# Patient Record
Sex: Female | Born: 1935 | Race: White | Hispanic: No | Smoking: Never smoker
Health system: Southern US, Community
[De-identification: ages and names within clinical notes are randomized; demographics above are authoritative.]

## PROBLEM LIST (undated history)

## (undated) DIAGNOSIS — J449 Chronic obstructive pulmonary disease, unspecified: Secondary | ICD-10-CM

## (undated) DIAGNOSIS — F0390 Unspecified dementia without behavioral disturbance: Secondary | ICD-10-CM

## (undated) DIAGNOSIS — K219 Gastro-esophageal reflux disease without esophagitis: Secondary | ICD-10-CM

## (undated) DIAGNOSIS — F419 Anxiety disorder, unspecified: Secondary | ICD-10-CM

## (undated) DIAGNOSIS — I10 Essential (primary) hypertension: Secondary | ICD-10-CM

## (undated) DIAGNOSIS — F0280 Dementia in other diseases classified elsewhere without behavioral disturbance: Secondary | ICD-10-CM

## (undated) DIAGNOSIS — G309 Alzheimer's disease, unspecified: Secondary | ICD-10-CM

## (undated) DIAGNOSIS — N289 Disorder of kidney and ureter, unspecified: Secondary | ICD-10-CM

## (undated) HISTORY — PX: JOINT REPLACEMENT: SHX530

---

## 1999-10-25 ENCOUNTER — Encounter: Payer: Self-pay | Admitting: Emergency Medicine

## 1999-10-25 ENCOUNTER — Emergency Department (HOSPITAL_COMMUNITY): Admission: EM | Admit: 1999-10-25 | Discharge: 1999-10-25 | Payer: Self-pay | Admitting: Emergency Medicine

## 2000-02-20 ENCOUNTER — Encounter: Payer: Self-pay | Admitting: Emergency Medicine

## 2000-02-20 ENCOUNTER — Emergency Department (HOSPITAL_COMMUNITY): Admission: EM | Admit: 2000-02-20 | Discharge: 2000-02-20 | Payer: Self-pay | Admitting: Emergency Medicine

## 2006-11-25 ENCOUNTER — Encounter: Admission: RE | Admit: 2006-11-25 | Discharge: 2006-11-25 | Payer: Self-pay | Admitting: Family Medicine

## 2006-12-15 ENCOUNTER — Encounter: Admission: RE | Admit: 2006-12-15 | Discharge: 2006-12-15 | Payer: Self-pay | Admitting: Family Medicine

## 2007-02-16 ENCOUNTER — Other Ambulatory Visit: Admission: RE | Admit: 2007-02-16 | Discharge: 2007-02-16 | Payer: Self-pay | Admitting: Obstetrics and Gynecology

## 2008-07-19 ENCOUNTER — Inpatient Hospital Stay (HOSPITAL_COMMUNITY): Admission: EM | Admit: 2008-07-19 | Discharge: 2008-07-27 | Payer: Self-pay | Admitting: Emergency Medicine

## 2008-07-22 ENCOUNTER — Ambulatory Visit: Payer: Self-pay | Admitting: Physical Medicine & Rehabilitation

## 2008-07-23 ENCOUNTER — Encounter (INDEPENDENT_AMBULATORY_CARE_PROVIDER_SITE_OTHER): Payer: Self-pay | Admitting: Internal Medicine

## 2008-08-12 ENCOUNTER — Ambulatory Visit (HOSPITAL_COMMUNITY): Admission: RE | Admit: 2008-08-12 | Discharge: 2008-08-12 | Payer: Self-pay | Admitting: Internal Medicine

## 2008-09-24 ENCOUNTER — Emergency Department (HOSPITAL_COMMUNITY): Admission: EM | Admit: 2008-09-24 | Discharge: 2008-09-24 | Payer: Self-pay | Admitting: Emergency Medicine

## 2009-01-02 ENCOUNTER — Inpatient Hospital Stay (HOSPITAL_COMMUNITY): Admission: EM | Admit: 2009-01-02 | Discharge: 2009-01-07 | Payer: Self-pay | Admitting: Emergency Medicine

## 2009-06-01 ENCOUNTER — Inpatient Hospital Stay (HOSPITAL_COMMUNITY): Admission: EM | Admit: 2009-06-01 | Discharge: 2009-06-03 | Payer: Self-pay | Admitting: Emergency Medicine

## 2009-06-02 ENCOUNTER — Ambulatory Visit: Payer: Self-pay | Admitting: Surgery

## 2009-06-02 ENCOUNTER — Encounter (INDEPENDENT_AMBULATORY_CARE_PROVIDER_SITE_OTHER): Payer: Self-pay | Admitting: Internal Medicine

## 2010-06-10 ENCOUNTER — Encounter: Payer: Self-pay | Admitting: Obstetrics and Gynecology

## 2010-08-05 LAB — DIFFERENTIAL
Basophils Absolute: 0 10*3/uL (ref 0.0–0.1)
Eosinophils Absolute: 0.1 10*3/uL (ref 0.0–0.7)
Eosinophils Absolute: 0.1 10*3/uL (ref 0.0–0.7)
Eosinophils Relative: 2 % (ref 0–5)
Eosinophils Relative: 2 % (ref 0–5)
Lymphocytes Relative: 18 % (ref 12–46)
Lymphocytes Relative: 19 % (ref 12–46)
Lymphs Abs: 1.1 10*3/uL (ref 0.7–4.0)
Monocytes Absolute: 0.5 10*3/uL (ref 0.1–1.0)
Monocytes Absolute: 0.5 10*3/uL (ref 0.1–1.0)
Monocytes Relative: 8 % (ref 3–12)
Neutro Abs: 4.3 10*3/uL (ref 1.7–7.7)

## 2010-08-05 LAB — CBC
HCT: 30.4 % — ABNORMAL LOW (ref 36.0–46.0)
MCHC: 34.4 g/dL (ref 30.0–36.0)
MCHC: 34.9 g/dL (ref 30.0–36.0)
MCV: 93.2 fL (ref 78.0–100.0)
Platelets: 145 10*3/uL — ABNORMAL LOW (ref 150–400)
Platelets: 167 10*3/uL (ref 150–400)
RBC: 3.39 MIL/uL — ABNORMAL LOW (ref 3.87–5.11)
RBC: 3.61 MIL/uL — ABNORMAL LOW (ref 3.87–5.11)
WBC: 4 10*3/uL (ref 4.0–10.5)
WBC: 5.7 10*3/uL (ref 4.0–10.5)

## 2010-08-05 LAB — BASIC METABOLIC PANEL
BUN: 18 mg/dL (ref 6–23)
Calcium: 8.4 mg/dL (ref 8.4–10.5)
Calcium: 8.7 mg/dL (ref 8.4–10.5)
Chloride: 111 mEq/L (ref 96–112)
Creatinine, Ser: 0.94 mg/dL (ref 0.4–1.2)
GFR calc Af Amer: >60 mL/min (ref >60)
GFR calc non Af Amer: 56 mL/min — ABNORMAL LOW (ref >60)
GFR calc non Af Amer: 59 mL/min — ABNORMAL LOW (ref >60)
Potassium: 4.4 mEq/L (ref 3.5–5.1)

## 2010-08-05 LAB — COMPREHENSIVE METABOLIC PANEL
ALT: 14 U/L (ref 0–35)
AST: 24 U/L (ref 0–37)
Albumin: 3.3 g/dL — ABNORMAL LOW (ref 3.5–5.2)
CO2: 27 mEq/L (ref 19–32)
Calcium: 8.2 mg/dL — ABNORMAL LOW (ref 8.4–10.5)
Chloride: 108 mEq/L (ref 96–112)
GFR calc Af Amer: >60 mL/min (ref >60)
GFR calc non Af Amer: 57 mL/min — ABNORMAL LOW (ref >60)
Sodium: 139 mEq/L (ref 135–145)

## 2010-08-05 LAB — CARDIAC PANEL(CRET KIN+CKTOT+MB+TROPI)
CK, MB: 8.5 ng/mL (ref 0.3–4.0)
CK, MB: 9 ng/mL (ref 0.3–4.0)
Relative Index: 3.7 — ABNORMAL HIGH (ref 0.0–2.5)
Total CK: 208 U/L — ABNORMAL HIGH (ref 7–177)
Total CK: 224 U/L — ABNORMAL HIGH (ref 7–177)
Total CK: 231 U/L — ABNORMAL HIGH (ref 7–177)
Troponin I: 0.02 ng/mL (ref 0.00–0.06)
Troponin I: 0.03 ng/mL (ref 0.00–0.06)
Troponin I: 0.05 ng/mL (ref 0.00–0.06)

## 2010-08-05 LAB — TSH: TSH: 2.405 u[IU]/mL (ref 0.350–4.500)

## 2010-08-05 LAB — URINALYSIS, ROUTINE W REFLEX MICROSCOPIC
Bilirubin Urine: NEGATIVE
Nitrite: NEGATIVE
Protein, ur: NEGATIVE mg/dL
Specific Gravity, Urine: 1.007 (ref 1.005–1.030)
Urobilinogen, UA: 0.2 mg/dL (ref 0.0–1.0)
pH: 7 (ref 5.0–8.0)

## 2010-08-05 LAB — URINE CULTURE

## 2010-08-05 LAB — T3, FREE: T3, Free: 2.3 pg/mL (ref 2.3–4.2)

## 2010-08-05 LAB — URINE MICROSCOPIC-ADD ON

## 2010-08-05 LAB — POCT CARDIAC MARKERS: Myoglobin, poc: 264 ng/mL (ref 12–200)

## 2010-08-23 ENCOUNTER — Emergency Department (HOSPITAL_COMMUNITY): Payer: Medicare Other

## 2010-08-23 ENCOUNTER — Emergency Department (HOSPITAL_COMMUNITY)
Admission: EM | Admit: 2010-08-23 | Discharge: 2010-08-23 | Disposition: A | Discharge status: Home / Self Care | Payer: Medicare Other | Attending: Emergency Medicine | Admitting: Emergency Medicine

## 2010-08-23 DIAGNOSIS — Z96649 Presence of unspecified artificial hip joint: Secondary | ICD-10-CM | POA: Insufficient documentation

## 2010-08-23 DIAGNOSIS — W010XXA Fall on same level from slipping, tripping and stumbling without subsequent striking against object, initial encounter: Secondary | ICD-10-CM | POA: Insufficient documentation

## 2010-08-23 DIAGNOSIS — F0390 Unspecified dementia without behavioral disturbance: Secondary | ICD-10-CM | POA: Insufficient documentation

## 2010-08-23 DIAGNOSIS — IMO0002 Reserved for concepts with insufficient information to code with codable children: Secondary | ICD-10-CM | POA: Insufficient documentation

## 2010-08-23 DIAGNOSIS — M542 Cervicalgia: Secondary | ICD-10-CM | POA: Insufficient documentation

## 2010-08-23 DIAGNOSIS — S0100XA Unspecified open wound of scalp, initial encounter: Secondary | ICD-10-CM | POA: Insufficient documentation

## 2010-08-23 DIAGNOSIS — I10 Essential (primary) hypertension: Secondary | ICD-10-CM | POA: Insufficient documentation

## 2010-08-23 DIAGNOSIS — Y92009 Unspecified place in unspecified non-institutional (private) residence as the place of occurrence of the external cause: Secondary | ICD-10-CM | POA: Insufficient documentation

## 2010-08-25 LAB — COMPREHENSIVE METABOLIC PANEL
AST: 22 U/L (ref 0–37)
BUN: 24 mg/dL — ABNORMAL HIGH (ref 6–23)
CO2: 27 mEq/L (ref 19–32)
Calcium: 8.7 mg/dL (ref 8.4–10.5)
Chloride: 111 mEq/L (ref 96–112)
Creatinine, Ser: 0.97 mg/dL (ref 0.4–1.2)
GFR calc Af Amer: >60 mL/min (ref >60)
GFR calc non Af Amer: 54 mL/min — ABNORMAL LOW (ref >60)
Glucose, Bld: 135 mg/dL — ABNORMAL HIGH (ref 70–99)
Total Bilirubin: 0.5 mg/dL (ref 0.3–1.2)

## 2010-08-25 LAB — URINE CULTURE

## 2010-08-25 LAB — URINALYSIS, ROUTINE W REFLEX MICROSCOPIC
Ketones, ur: NEGATIVE mg/dL
Nitrite: NEGATIVE
Protein, ur: NEGATIVE mg/dL
pH: 5.5 (ref 5.0–8.0)

## 2010-08-25 LAB — LIPID PANEL
HDL: 69 mg/dL (ref >39)
Total CHOL/HDL Ratio: 2.1 RATIO
Triglycerides: 22 mg/dL (ref <150)

## 2010-08-25 LAB — GLUCOSE, CAPILLARY
Glucose-Capillary: 105 mg/dL — ABNORMAL HIGH (ref 70–99)
Glucose-Capillary: 115 mg/dL — ABNORMAL HIGH (ref 70–99)
Glucose-Capillary: 118 mg/dL — ABNORMAL HIGH (ref 70–99)
Glucose-Capillary: 120 mg/dL — ABNORMAL HIGH (ref 70–99)
Glucose-Capillary: 121 mg/dL — ABNORMAL HIGH (ref 70–99)
Glucose-Capillary: 128 mg/dL — ABNORMAL HIGH (ref 70–99)
Glucose-Capillary: 140 mg/dL — ABNORMAL HIGH (ref 70–99)
Glucose-Capillary: 162 mg/dL — ABNORMAL HIGH (ref 70–99)
Glucose-Capillary: 70 mg/dL (ref 70–99)
Glucose-Capillary: 83 mg/dL (ref 70–99)
Glucose-Capillary: 85 mg/dL (ref 70–99)
Glucose-Capillary: 89 mg/dL (ref 70–99)
Glucose-Capillary: 89 mg/dL (ref 70–99)

## 2010-08-25 LAB — BASIC METABOLIC PANEL
CO2: 28 mEq/L (ref 19–32)
GFR calc non Af Amer: 46 mL/min — ABNORMAL LOW (ref >60)
GFR calc non Af Amer: 53 mL/min — ABNORMAL LOW (ref >60)
Glucose, Bld: 96 mg/dL (ref 70–99)
Potassium: 3.7 mEq/L (ref 3.5–5.1)
Potassium: 3.7 mEq/L (ref 3.5–5.1)
Sodium: 137 mEq/L (ref 135–145)
Sodium: 144 mEq/L (ref 135–145)

## 2010-08-25 LAB — CBC
HCT: 32.9 % — ABNORMAL LOW (ref 36.0–46.0)
HCT: 33 % — ABNORMAL LOW (ref 36.0–46.0)
Hemoglobin: 11.1 g/dL — ABNORMAL LOW (ref 12.0–15.0)
MCHC: 33.8 g/dL (ref 30.0–36.0)
MCV: 94.8 fL (ref 78.0–100.0)
Platelets: 179 10*3/uL (ref 150–400)
RDW: 14.5 % (ref 11.5–15.5)
WBC: 7 10*3/uL (ref 4.0–10.5)

## 2010-08-25 LAB — POCT I-STAT, CHEM 8
Chloride: 111 mEq/L (ref 96–112)
Creatinine, Ser: 1 mg/dL (ref 0.4–1.2)
Glucose, Bld: 90 mg/dL (ref 70–99)
HCT: 32 % — ABNORMAL LOW (ref 36.0–46.0)
Hemoglobin: 10.9 g/dL — ABNORMAL LOW (ref 12.0–15.0)
Potassium: 4.3 mEq/L (ref 3.5–5.1)
Sodium: 144 mEq/L (ref 135–145)

## 2010-08-25 LAB — PROTIME-INR
Prothrombin Time: 14.3 seconds (ref 11.6–15.2)
Prothrombin Time: 14.4 seconds (ref 11.6–15.2)

## 2010-08-25 LAB — BRAIN NATRIURETIC PEPTIDE: Pro B Natriuretic peptide (BNP): 270 pg/mL — ABNORMAL HIGH (ref 0.0–100.0)

## 2010-08-25 LAB — CK TOTAL AND CKMB (NOT AT ARMC)
CK, MB: 2.7 ng/mL (ref 0.3–4.0)
Relative Index: INVALID (ref 0.0–2.5)
Relative Index: INVALID (ref 0.0–2.5)
Total CK: 82 U/L (ref 7–177)

## 2010-08-25 LAB — HEMOGLOBIN A1C: Hgb A1c MFr Bld: 5.5 % (ref 4.6–6.1)

## 2010-08-25 LAB — TSH: TSH: 2.112 u[IU]/mL (ref 0.350–4.500)

## 2010-08-28 LAB — URINALYSIS, ROUTINE W REFLEX MICROSCOPIC
Ketones, ur: NEGATIVE mg/dL
Nitrite: NEGATIVE
Protein, ur: NEGATIVE mg/dL
pH: 7 (ref 5.0–8.0)

## 2010-08-30 LAB — CROSSMATCH

## 2010-08-30 LAB — HEPATIC FUNCTION PANEL
ALT: 14 U/L (ref 0–35)
AST: 22 U/L (ref 0–37)
Albumin: 3.2 g/dL — ABNORMAL LOW (ref 3.5–5.2)
Alkaline Phosphatase: 49 U/L (ref 39–117)
Bilirubin, Direct: 0.2 mg/dL (ref 0.0–0.3)
Indirect Bilirubin: 0.8 mg/dL (ref 0.3–0.9)
Total Bilirubin: 1 mg/dL (ref 0.3–1.2)
Total Protein: 5.6 g/dL — ABNORMAL LOW (ref 6.0–8.3)

## 2010-08-30 LAB — BASIC METABOLIC PANEL
BUN: 11 mg/dL (ref 6–23)
BUN: 12 mg/dL (ref 6–23)
BUN: 14 mg/dL (ref 6–23)
BUN: 14 mg/dL (ref 6–23)
BUN: 14 mg/dL (ref 6–23)
CO2: 24 mEq/L (ref 19–32)
CO2: 26 mEq/L (ref 19–32)
Calcium: 8.3 mg/dL — ABNORMAL LOW (ref 8.4–10.5)
Calcium: 8.4 mg/dL (ref 8.4–10.5)
Chloride: 104 mEq/L (ref 96–112)
Chloride: 107 mEq/L (ref 96–112)
Chloride: 108 mEq/L (ref 96–112)
Chloride: 108 mEq/L (ref 96–112)
Creatinine, Ser: 0.9 mg/dL (ref 0.4–1.2)
Creatinine, Ser: 0.92 mg/dL (ref 0.4–1.2)
Creatinine, Ser: 0.99 mg/dL (ref 0.4–1.2)
Creatinine, Ser: 1 mg/dL (ref 0.4–1.2)
Creatinine, Ser: 1.09 mg/dL (ref 0.4–1.2)
GFR calc Af Amer: 57 mL/min — ABNORMAL LOW (ref >60)
GFR calc Af Amer: >60 mL/min (ref >60)
GFR calc non Af Amer: 47 mL/min — ABNORMAL LOW (ref >60)
GFR calc non Af Amer: 53 mL/min — ABNORMAL LOW (ref >60)
GFR calc non Af Amer: 57 mL/min — ABNORMAL LOW (ref >60)
Glucose, Bld: 101 mg/dL — ABNORMAL HIGH (ref 70–99)
Glucose, Bld: 80 mg/dL (ref 70–99)
Glucose, Bld: 99 mg/dL (ref 70–99)
Potassium: 3.4 mEq/L — ABNORMAL LOW (ref 3.5–5.1)
Potassium: 3.5 mEq/L (ref 3.5–5.1)
Potassium: 3.8 mEq/L (ref 3.5–5.1)

## 2010-08-30 LAB — URINE MICROSCOPIC-ADD ON

## 2010-08-30 LAB — CBC
HCT: 26.3 % — ABNORMAL LOW (ref 36.0–46.0)
HCT: 26.8 % — ABNORMAL LOW (ref 36.0–46.0)
HCT: 28.9 % — ABNORMAL LOW (ref 36.0–46.0)
HCT: 33.9 % — ABNORMAL LOW (ref 36.0–46.0)
Hemoglobin: 11.7 g/dL — ABNORMAL LOW (ref 12.0–15.0)
Hemoglobin: 9.3 g/dL — ABNORMAL LOW (ref 12.0–15.0)
MCHC: 34 g/dL (ref 30.0–36.0)
MCHC: 34.5 g/dL (ref 30.0–36.0)
MCHC: 35 g/dL (ref 30.0–36.0)
MCV: 90.7 fL (ref 78.0–100.0)
MCV: 91.1 fL (ref 78.0–100.0)
MCV: 91.7 fL (ref 78.0–100.0)
MCV: 92 fL (ref 78.0–100.0)
MCV: 92.4 fL (ref 78.0–100.0)
Platelets: 135 10*3/uL — ABNORMAL LOW (ref 150–400)
Platelets: 138 10*3/uL — ABNORMAL LOW (ref 150–400)
Platelets: 140 10*3/uL — ABNORMAL LOW (ref 150–400)
Platelets: 140 10*3/uL — ABNORMAL LOW (ref 150–400)
RBC: 2.58 MIL/uL — ABNORMAL LOW (ref 3.87–5.11)
RBC: 3.67 MIL/uL — ABNORMAL LOW (ref 3.87–5.11)
RBC: 3.68 MIL/uL — ABNORMAL LOW (ref 3.87–5.11)
RBC: 4.1 MIL/uL (ref 3.87–5.11)
RDW: 13.2 % (ref 11.5–15.5)
RDW: 13.3 % (ref 11.5–15.5)
RDW: 13.6 % (ref 11.5–15.5)
RDW: 13.7 % (ref 11.5–15.5)
WBC: 6.8 10*3/uL (ref 4.0–10.5)
WBC: 6.9 10*3/uL (ref 4.0–10.5)
WBC: 7.4 10*3/uL (ref 4.0–10.5)
WBC: 8 10*3/uL (ref 4.0–10.5)
WBC: 9.1 10*3/uL (ref 4.0–10.5)

## 2010-08-30 LAB — BASIC METABOLIC PANEL WITH GFR
CO2: 24 meq/L (ref 19–32)
Chloride: 105 meq/L (ref 96–112)
GFR calc Af Amer: >60 mL/min (ref >60)
Glucose, Bld: 114 mg/dL — ABNORMAL HIGH (ref 70–99)
Sodium: 136 meq/L (ref 135–145)

## 2010-08-30 LAB — DIFFERENTIAL
Basophils Absolute: 0 K/uL (ref 0.0–0.1)
Basophils Relative: 1 % (ref 0–1)
Eosinophils Absolute: 0 10*3/uL (ref 0.0–0.7)
Eosinophils Absolute: 0 K/uL (ref 0.0–0.7)
Eosinophils Relative: 0 % (ref 0–5)
Lymphocytes Relative: 5 % — ABNORMAL LOW (ref 12–46)
Lymphs Abs: 0.3 10*3/uL — ABNORMAL LOW (ref 0.7–4.0)
Lymphs Abs: 0.9 10*3/uL (ref 0.7–4.0)
Monocytes Absolute: 0.5 K/uL (ref 0.1–1.0)
Monocytes Relative: 7 % (ref 3–12)
Monocytes Relative: 8 % (ref 3–12)
Neutro Abs: 5.9 10*3/uL (ref 1.7–7.7)
Neutro Abs: 7.4 10*3/uL (ref 1.7–7.7)
Neutrophils Relative %: 81 % — ABNORMAL HIGH (ref 43–77)
Neutrophils Relative %: 87 % — ABNORMAL HIGH (ref 43–77)

## 2010-08-30 LAB — PROTIME-INR
INR: 1.2 (ref 0.00–1.49)
INR: 1.4 (ref 0.00–1.49)
INR: 1.4 (ref 0.00–1.49)
Prothrombin Time: 15.8 seconds — ABNORMAL HIGH (ref 11.6–15.2)
Prothrombin Time: 15.8 seconds — ABNORMAL HIGH (ref 11.6–15.2)
Prothrombin Time: 17.1 seconds — ABNORMAL HIGH (ref 11.6–15.2)
Prothrombin Time: 17.8 seconds — ABNORMAL HIGH (ref 11.6–15.2)
Prothrombin Time: 18.2 seconds — ABNORMAL HIGH (ref 11.6–15.2)
Prothrombin Time: 19.2 seconds — ABNORMAL HIGH (ref 11.6–15.2)

## 2010-08-30 LAB — URINALYSIS, ROUTINE W REFLEX MICROSCOPIC
Bilirubin Urine: NEGATIVE
Glucose, UA: NEGATIVE mg/dL
Ketones, ur: 15 mg/dL — AB
Leukocytes, UA: NEGATIVE
Nitrite: NEGATIVE
Protein, ur: 30 mg/dL — AB
Specific Gravity, Urine: 1.017 (ref 1.005–1.030)
Urobilinogen, UA: 1 mg/dL (ref 0.0–1.0)
pH: 7.5 (ref 5.0–8.0)

## 2010-08-30 LAB — VITAMIN B12: Vitamin B-12: 291 pg/mL (ref 211–911)

## 2010-08-30 LAB — FOLATE: Folate: >20 ng/mL

## 2010-08-30 LAB — TROPONIN I: Troponin I: 0.04 ng/mL (ref 0.00–0.06)

## 2010-08-30 LAB — CARDIAC PANEL(CRET KIN+CKTOT+MB+TROPI)
Relative Index: 2.6 — ABNORMAL HIGH (ref 0.0–2.5)
Total CK: 385 U/L — ABNORMAL HIGH (ref 7–177)
Total CK: 394 U/L — ABNORMAL HIGH (ref 7–177)
Troponin I: 0.04 ng/mL (ref 0.00–0.06)

## 2010-08-30 LAB — RETICULOCYTES
RBC.: 3.59 MIL/uL — ABNORMAL LOW (ref 3.87–5.11)
Retic Count, Absolute: 53.9 10*3/uL (ref 19.0–186.0)
Retic Ct Pct: 1.5 % (ref 0.4–3.1)

## 2010-08-30 LAB — IRON AND TIBC
Iron: <10 ug/dL — ABNORMAL LOW (ref 42–135)
UIBC: 225 ug/dL

## 2010-08-30 LAB — CULTURE, BLOOD (ROUTINE X 2)
Culture: NO GROWTH
Culture: NO GROWTH

## 2010-08-30 LAB — FERRITIN: Ferritin: 130 ng/mL (ref 10–291)

## 2010-08-30 LAB — URINE CULTURE
Colony Count: NO GROWTH
Culture: NO GROWTH

## 2010-08-30 LAB — CK TOTAL AND CKMB (NOT AT ARMC)
CK, MB: 3.1 ng/mL (ref 0.3–4.0)
Relative Index: 2 (ref 0.0–2.5)
Total CK: 152 U/L (ref 7–177)

## 2010-08-30 LAB — ABO/RH: ABO/RH(D): A NEG

## 2010-08-30 LAB — GLUCOSE, CAPILLARY: Glucose-Capillary: 97 mg/dL (ref 70–99)

## 2010-08-30 LAB — HEMOCCULT GUIAC POC 1CARD (OFFICE): Fecal Occult Bld: POSITIVE

## 2010-08-30 LAB — TECHNOLOGIST SMEAR REVIEW

## 2010-08-30 LAB — TSH: TSH: 2.562 u[IU]/mL (ref 0.350–4.500)

## 2010-08-30 LAB — APTT: aPTT: 31 seconds (ref 24–37)

## 2010-08-30 LAB — LACTATE DEHYDROGENASE: LDH: 168 U/L (ref 94–250)

## 2010-08-30 LAB — MAGNESIUM: Magnesium: 2.2 mg/dL (ref 1.5–2.5)

## 2010-08-30 LAB — HEMOGLOBIN AND HEMATOCRIT, BLOOD: Hemoglobin: 13.6 g/dL (ref 12.0–15.0)

## 2010-10-02 NOTE — Consult Note (Signed)
[name], [name] NO.:  [id number]   MEDICAL RECORD NO.:  [id number]          PATIENT TYPE:  INP   LOCATION:  2601                         FACILITY:  [facility]   PHYSICIAN:  Lyn Records, M.D.   DATE OF BIRTH:  [birth year]   DATE OF CONSULTATION:  01/03/2009  DATE OF DISCHARGE:                                 CONSULTATION   CONCLUSIONS:  1. Sinus node dysfunction.      a.     Paroxysmal atrial fibrillation during March 2010 admission.      b.     Sinus bradycardia and junctional rhythm on beta-blocker       therapy, January 02, 2009.  2. Chronic obstructive pulmonary disease.  3. Dementia.  4. Hypertension.  5. Lethargy and altered mental status.   RECOMMENDATIONS:  1. Resume beta-blocker therapy and add metoprolol 12.5 mg b.i.d.  2. No specific cardiac workup is needed.  3. Call if recurrent atrial fibrillation.  4. Not a good Coumadin candidate.  5. Okay to use amlodipine (it will not slow heart rate).  It could be      the cause of the patient's peripheral edema, however.  6. Call if we can be of further assistance.   COMMENTS:  Ms. Piggott is 64.  She has dementia.  She lives alone.  She  was admitted with lethargy and altered mental status.  It was suspected  that she got too much Benadryl and possibly other medications.  When she  was admitted to the hospital, she had bradycardia with junctional escape  rhythm of 42 beats per minute.  EKG revealed an ectopic atrial  bradycardia at 44 beats per minute.  No acute ST-T wave changes were  noted.  The patient's blood pressure was normal.  It was not clear if  she was having any symptoms related to the bradycardia.  Certainly with  normal blood pressure on admission, it is unlikely that the bradycardia  was the cause of her altered mental status.  Nevertheless, the beta-  blocker therapy was held.  The patient's heart rate is in the 70s this  morning.  Her family is with her.  They state that her level of  consciousness is much improved.   MEDICATIONS:  Her medical regimen on admission to the hospital included:  1. Alprazolam 1 mg twice a day.  2. Avapro as needed.  3. Benadryl 25 mg every 6 hours.  4. Metoprolol 50 mg daily.  5. Namenda twice a day.  6. Amlodipine 2.5 mg daily.   OBJECTIVE:  VITAL SIGNS:  Blood pressure is 143/94 and heart rate is 78.  HEENT:  Mouth revealed sinus rhythm.  NECK:  No JVD is noted.  LUNGS:  Clear.  CARDIAC:  No gallop, murmur, or click.  ABDOMEN:  Soft.  EXTREMITIES:  Scaling erythema in the lower extremities bilaterally.  NEUROLOGIC:  Reveals a patient who is confused but in no acute distress.   IMAGING:  EKG done yesterday reveals a junctional escape rhythm at 44  beats per minute.   LABORATORY DATA:  A white blood  cell count of 5300, hemoglobin of 11.2,  and platelet count of 179,000.  Admitting glucose was 62, creatinine  1.0, and BUN 30.  Cardiac markers were unremarkable.  BNP was 270.   Vital signs had demonstrated adequate blood pressures since admission.   DISCUSSION:  The patient is doing well at this time.  I still believe  that a little beta-blocker will be helpful.  We will resume metoprolol  at 12.5 mg p.o. b.i.d.      Lyn Records, M.D.  Electronically Signed     HWS/MEDQ  D:  01/03/2009  T:  01/03/2009  Job:  [id number]   cc:   Donia Guiles, M.D.

## 2010-10-02 NOTE — Consult Note (Signed)
NAMEGRACI, HULCE NO.:  [id number]   MEDICAL RECORD NO.:  [id number]          PATIENT TYPE:  INP   LOCATION:  4708                         FACILITY:  [facility]   PHYSICIAN:  Lyn Records, M.D.   DATE OF BIRTH:  Feb 12, 1917   DATE OF CONSULTATION:  07/23/2008  DATE OF DISCHARGE:                                 CONSULTATION   REASON FOR CONSULTATION:  Paroxysmal atrial fibrillation.   CONCLUSIONS:  1. Paroxysmal atrial fibrillation, stress-induced.  She is currently      in normal sinus rhythm.  The stress factors include pain, beta-      blocker withdrawal, respiratory infection.  2. Left hip fracture suffered while hospitalized.  3. COPD, now compensated.  4. Anemia.  5. Hypertension.  6. Dementia.   RECOMMENDATIONS:  1. Resume/continue beta-blocker therapy and for the time being      increase the dose of metoprolol to 75 mg per day.  2. Check BNP.  3. Echocardiogram to evaluate LV function.  4. No further or specific cardiac evaluation.  5. Not a reasonable long-term Coumadin candidate.   COMMENTS:  The patient is 73 and has a history of dementia.  She was  admitted to the hospital with shortness of breath, decompensated.  She  has not had history of cardiac problems.  She is demented and lives in a  nursing facility.  The medications on admission to the hospital were  metoprolol and alprazolam.  While in the hospital, she became confused  and fell, causing a hip fracture.  Since that time including pinning of  her hip, she has had paroxysmal atrial fibrillation.   ALLERGIES:  None known.   SOCIAL HISTORY:  Lives in assisted living.  Denies tobacco.  Denies  ethanol.   FAMILY HISTORY:  Noncontributory.   OBJECTIVE:  VITAL SIGNS:  On exam, the blood pressure is 130/70, the  heart rate is 98 sinus rhythm with PACs.  Neck veins are clear 1/6  systolic murmurs heard.  LUNGS:  Clear.  EXTREMITIES:  No edema.    Laboratory data revealed no gross  abnormalities.  Troponins have been  negative.  TSH is unremarkable.  EKG reveals no acute ST-T wave change.   DISCUSSION:  The patient is having paroxysmal atrial fibrillation for  multiple reasons including her age and stress of this hospitalization.  We will simply increase her current beta-blocker dose and hope that this  will help calm things down.  Will follow with you.      Lyn Records, M.D.  Electronically Signed     HWS/MEDQ  D:  07/23/2008  T:  07/24/2008  Job:  [id number]   cc:   Donia Guiles, M.D.

## 2010-10-02 NOTE — Discharge Summary (Signed)
NAMEBROCHA, Sherri NO.:  [id number]   MEDICAL RECORD NO.:  [id number]          PATIENT TYPE:  INP   LOCATION:  6727                         FACILITY:  [facility]   PHYSICIAN:  Kela Millin, M.D.DATE OF BIRTH:  1917/01/27   DATE OF ADMISSION:  01/02/2009  DATE OF DISCHARGE:  01/07/2009                               DISCHARGE SUMMARY   ADMITTING PHYSICIAN:  Michiel Cowboy, MD   DISCHARGING PHYSICIAN:  Kela Millin, MD   CONSULTANTSGeorga Hacking, MD, with Cardiology.   PRIMARY CARE PHYSICIAN:  Donia Guiles, MD   CHIEF COMPLAINT/REASON FOR ADMISSION:  Ms. [name] is a 77 year old  female patient with history of dementia, paroxysmal atrial fibrillation,  and COPD who for 2 weeks has been having increasing lower extremity  edema and redness and itching bilaterally.  The family has been using  Benadryl cream as well as hydrocortisone cream without improvement in  symptoms.  Prior to admission, the patient lived alone in an elder care  apartment complex on the ninth floor and despite her dementia apparently  was capable of taking care for herself and self-administer medications.  At the time the patient was admitted, she was admitted because of  sleepiness and lethargy and it was uncertain if the patient had  accidentally overmedicated herself on the Benadryl.  She was very  lethargic upon presentation and ED physician noted that she had  bradycardia down into the 40s, uncertain what her initial glucose was by  glucometer.  Apparently, she was she was started on fluids with dextrose  in the event hypoglycemia was contributing to her altered mental status.  Family was present during the patient's admission and family thinks that  the patient has been adequately and accurately taking her medications,  but they could not be sure because they did not have an accurate pill  count on the medications prior to beginning administering the over-the-  counter medications and therefore could not be again sure of the actual  dosage the patient had taken.  Again, on exam, the patient was afebrile,  vital signs were stable except for the bradycardia, heart rate in the  40s, occasionally up into the 50s.  She was in no acute distress  otherwise other than the lethargy.  Lungs were clear.  The pulse itself  was regular.  She had 2+ lower extremity edema without erythema, some  superficial skin discoloration.  Hemoglobin was 10.9.  Sodium 144,  potassium 4.3, and creatinine 1.0.  Initial cardiac isoenzymes were  negative.  Initial chest x-ray showed COPD and EKG showed junctional  escape rhythm with a rate of 40.  The patient was admitted with the  following diagnoses:  1. Lethargy, multifactorial, secondary to possible accidental over-      ingestion of Benadryl as well as due to the bradycardia.  2. Chronic lower extremity edema.  3. History of right-sided heart failure based on echo.  4. Hypertension, currently moderately controlled.  5. History of paroxysmal atrial fibrillation, apparently not a      Coumadin candidate.  6. History of chronic  obstructive pulmonary disease, currently      compensated.  7. History of dysphasia with history of aspiration pneumonia.  8. History of dementia.  9. Deconditioning.  10.Chronic anemia.   HOSPITAL COURSE:  The patient was admitted to the telemetry floor.  Because of bradycardia, her beta-blockers were placed on hold.  Also,  her Norvasc was placed on hold, especially with the lower extremity  edema.  Orthostatic vital signs were checked and BNPs were routinely  checked.  CT of the head was also checked which revealed basically  chronic atrophy as well as chronic ischemic white matter disease.  Urinalysis and culture proved to be negative for any infectious etiology  of the patient's altered mental status.  As noted, a cardiology consult  was obtained and recommendations were to restart  beta-blocker at very  low dose 25 mg a day once the patient return to sinus brady sinus  rhythm.  This did occur on January 03, 2009.  No additional cardiac  workup was indicated and it was reported that she was not a good  Coumadin candidate.  Dr. Katrinka Blazing actually evaluated the patient and he  reported that it was okay for her to use amlodipine because there is no  chronotropic affect with that medication.  The patient did tolerate a  low-dose beta-blocker without any worsening of bradycardia and the  orthostasis, therefore no indications for pacemaker and no additional  cardiac workup and Cardiology signed off.   For the next several days, the patient's mental status was the main  issue and there was some concern as to whether she could return back to  independent living.  Clinical Social Work was consulted for placement.  They met with the patient's son, [name].  He stated the patient had been  in [place] in the past because of her fractured hip, but was very  unhappy not being in her own home and really wanted to try to make more  attempt to be able to return home.  Therefore, he is requested that she  be able to go home with home health.  Family will stay with her 24 hours  for the next 2 weeks.   On the day before discharge, I had a long discussion with the patient's  son regarding expectations at discharge given the findings in the CT  scan and known dementia and concerns that with an acute break in mental  status that she may not regain to baseline.  He told me that on January 05, 2009, they had actually been to visit a neurologist.  I guess he is  managing her Namenda for her dementia and reported at that time she had  no significant issues and was deemed appropriate for independent living  at that time.  Throughout the conversation, it was emphasized that the  patient again may not improve and she would require 24-hour care.  Son  wishes to attempt to take the patient home  back to the independent  living apartment complex, spend at least 2 weeks with her 24 hours  supervision with the family and in any event she does not return to  baseline, and he and Dr. Arvilla Market felt the patient inappropriate for  independent living.  They will look into assisted living and/or skilled  nursing placement.  24 hours prior to discharge, the patient was sleepy,  but awake, smiled appropriately.  Exam was unremarkable.  She did have a  Foley catheter in place and I evaluated the patient  close to 3 o'clock  in the afternoon and felt it was more appropriate to discontinue the  Foley today and wait until the morning to discharge the patient home  given the fact she needs to be observed to make sure she has no voiding  difficulties and I do not feel comfortable discharging an elderly person  home late in the evening.  Family agrees.  I did review the patient's  medication list with the son prior to discharge.  He is aware of the new  dosages on metoprolol and I discussed with him it is better to obtain a  new prescription for metoprolol, then to try to break the current 50 mg  tablets in quarters to ensure dosage accuracy.  I also discussed with  him that since her blood pressure is controlled, we can currently hold  the Norvasc.  This may have been contributing to her lower extremity  edema and probable stasis dermatitis which has resolved since being in  the hospital.   FINAL DISCHARGE DIAGNOSES:  1. Bradycardia secondary to beta-blocker usage.  2. Lower extremity edema, now resolved, possibly related to recent      decreased cardiac output due to bradycardia as well as use of      Norvasc.  3. Hypertension, currently controlled on low-dose beta-blockers.  4. Altered mental status and known dementia prior to admission.  5. Chronic anemia.  6. History of paroxysmal atrial fibrillation, not a Coumadin      candidate.   DISCHARGE MEDICATIONS:  1. Alprazolam 1 mg tablets to  take one-half to one-quarter tablet      every 12 hours as needed for anxiety.  2. Over-the-counter ibuprofen as needed for discomfort.  3. Over-the-counter Tylenol as needed for discomfort.  Benadryl,      please discontinued.  4. Metoprolol 25 mg tablets 1/2 tablet twice daily.  Please stop the      50-mg dosage tablets.  5. Namenda at previous dose twice daily.  6. Amlodipine 2.5 mg daily.  Please stop this medication.   DIET:  Low sodium, heart healthy, if needed booster Ensure supplements 3  times a day.   ACTIVITY:  Continue with current rolling walker, shower chair, and  toilet.  Increase activity slowly.  May walk up steps.  May walk with  assistance.   FOLLOWUP:  The patient needs to follow up with Dr. Arvilla Market.  She is to  call to be seen in 1-2 weeks.  Please note that [facility] will  continue with the patient after discharge.      Allison L. Rennis Harding, N.P.      Kela Millin, M.D.  Electronically Signed    ALE/MEDQ  D:  01/06/2009  T:  01/07/2009  Job:  [id number]   cc:   Donia Guiles, M.D.

## 2010-10-02 NOTE — Op Note (Signed)
NAMEKYANI, SIMKIN NO.:  [id number]   MEDICAL RECORD NO.:  [id number]          PATIENT TYPE:  INP   LOCATION:  6733                         FACILITY:  [facility]   PHYSICIAN:  Lubertha Basque. Dalldorf, M.D.DATE OF BIRTH:  [birth year]   DATE OF PROCEDURE:  07/21/2008  DATE OF DISCHARGE:                               OPERATIVE REPORT   PREOPERATIVE DIAGNOSIS:  Left femoral neck fracture.   POSTOPERATIVE DIAGNOSIS:  Left femoral neck fracture.   PROCEDURE:  Left hip hemiarthroplasty.   ANESTHESIA:  General.   ATTENDING SURGEON:  Lubertha Basque. Jerl Santos, MD   ASSISTANT:  Lindwood Qua, PA   INDICATIONS FOR PROCEDURE:  The patient is an 77 year old woman who  unfortunately suffered a fall in the hospital in the last 24 hours.  X-  rays have shown a displaced femoral neck fracture.  She is offered  hemiarthroplasty in hopes of allowing her to sit and potentially stand  once again.  Informed operative consent was obtained from her son after  discussion of possible complications including reaction to anesthesia,  infection, DVT, PE, and death.   SUMMARY, FINDINGS, AND PROCEDURE:  Under general anesthesia through a  small posterior approach, a left hip hemiarthroplasty was performed.  She had fair bone quality.  We addressed her problem with an uncemented  DePuy unipolar hemiarthroplasty using a size 3 basic Summit stem with a  -3 size 42 hip ball.  Lindwood Qua assisted throughout and was  invaluable to the completion of the case in that he helped position and  retract while I performed the procedure.  He also closed simultaneously  to help minimize OR time.   DESCRIPTION OF PROCEDURE:  The patient was taken to the operating suite  where general anesthetic was applied without difficulty.  She was  positioned in the lateral decubitus position with left hip up.  Hip  positioners were utilized, bony prominences were padded, and an axillary  roll was placed.  A posterior  approach was taken to the left hip.  After  the skin incision, the dissection was carried through a paucity of  adipose tissue to the IT band and gluteus maximus fascia.  IT band was  incised longitudinally to expose the short external rotators of the hip  which were tagged and reflected.  A posterior capsulectomy was  performed.  A femoral neck cut was made below the side of the fracture  with a saw.  We then removed the femoral head and sized to a 42.  This  was trialed and felt to be adequate.  We then reamed and broached up to  a size 3 at which point a trial reduction was done with a -3 neck  length.  This seemed to give [place] good leg length and good stability and  extension with external rotation and flexion with internal rotation.  The trial component was removed followed by placement of the size 3  basic Summit femoral stem in appropriate anteversion.  We then capped  this with a -3 size 42 hip ball.  She was reduced and again was stable  in the aforementioned positions.  The leg lengths were felt to be  roughly equal.  The wound was irrigated followed by reapproximation of  the short external rotators to the greater trochanteric region with  nonabsorbable suture.  IT band was reapproximated with #1 Vicryl in the  interrupted fashion as was the gluteus maximus fascia.  Subcutaneous  tissues were reapproximated with Vicryl followed by skin closure with  staples.  Mepilex dressing was applied.  Estimated blood loss was 50 mL  and intraoperative could be obtained from anesthesia records.   DISPOSITION:  The patient was extubated in the operating room and taken  to the recovery room in stable addition.  She was to be admitted back to  the Medicine Teaching Service for appropriate postop care.  We will  treat her with Lovenox for DVT prophylaxis along with immediate  mobilization.      Lubertha Basque Jerl Santos, M.D.  Electronically Signed     PGD/MEDQ  D:  07/21/2008  T:  07/22/2008   Job:  [id number]

## 2010-10-02 NOTE — Discharge Summary (Signed)
[name], [name]            ACCOUNT NO.:  [id number]   MEDICAL RECORD NO.:  [id number]          PATIENT TYPE:  INP   LOCATION:  4708                         FACILITY:  [facility]   PHYSICIAN:  Hollice Espy, M.D.DATE OF BIRTH:  12-26-1916   DATE OF ADMISSION:  07/18/2008  DATE OF DISCHARGE:  07/27/2008                               DISCHARGE SUMMARY   PCP:  Dr. Donia Guiles.   CONSULTANTS ON THIS CASE:  1. Dr. Verdis Prime, cardiology.  2. Dr. Marcene Corning, orthopedic surgery.   DISCHARGE DIAGNOSES:  1. Left femoral neck fracture status post left hip hemiarthroplasty.  2. Atrial fibrillation, paroxysmal, now normal sinus rhythm, rate      controlled.  3. Chronic obstructive pulmonary disease exacerbation.  4. Aspiration pneumonia.  5. Dysphagia.  6. Delirium felt to be multifactorial secondary to hypoxia from      pneumonia, chronic anxiolytic medication use, and possible      underlying dementia, as well as hospital psychosis.  7. History of hypertension.  8. Deconditioning.  9. Anemia secondary to postop status post 2 units packed red blood      cell transfusion.   HOSPITAL COURSE:  Patient is a 77 year old white female who has got a  history of hypertension who presented to the emergency room with  shortness of breath, cough, initially some substernal chest pain and she  was also found to have a temp as high of 102.4 and wheezing.  By x-ray,  it looked like she had some signs of pneumonia and COPD.  She was  started on IV antibiotics, neb treatments, and it was found later in a  discussion with her family the patient had, while not a smoker, had been  exposed to lots of secondhand smoke through her husband and was felt to  have some underlying COPD.  In regards to the COPD and pneumonia, she  actually did very well with antibiotics and neb treatments and during  her hospitalization completed the full 7 days of antibiotics.  It was  noted later on that she was  having some episodes of choking and so she  was evaluated by speech therapy.  She was found to have signs of  dysphagia.  A modified barium done July 26, 2008, noted moderate to  severe pharyngeal dysphagia secondary to generalized weakness coupled  with an anterior curvature over C-spine from 3 to 5 into the pharyngeal  space and pending a full epiglottic deflection.  They noted silent  aspiration of thin liquids.  Diet of dysphagia II nectar-thick liquids,  crushed meds, full supervision at meals, and swallowing 1 to 2 extra  times for swallow was recommended.  The plan will be to continue this  type of diet at her rehab facility.  In regard to her hip fracture,  during patient's hospitalization on the afternoon of July 20, 2008,  patient had tried to get out of bed and in her confused state she had  fallen.  Nurse tended to the patient and the patient reported that she  was not sure what had happened but she had fallen.  Her laceration  was  noted at her IV site.  Patient reported initially hitting her head but  she had no loss of consciousness.  [facility] were contacted and  upon injury report and with no change in her mentation patient was given  medication initially for pain starting off with Tylenol with plans to  increase pain medication as needed and then after a call I was planning  on returning to her bedside for a formal musculoskeletal exam.  Patient  was complaining of some pain in her right hip which she said improved  after receiving some Tylenol.  In the interim, patient's mentation  remained stable after her fall.  Approximately 2 hours later, report  came from nursing that the family was quite upset and wanted x-rays done  right away.  This was passed on to me and I ordered a skeletal survey  specifically right hip films.  Then, following this, I spoke with the  patient's son for about 45 minutes discussing his mother's case.  We  covered the events and the care  and while the patient's son was upset  with the situation he appeared to be pleased with my plans for treatment  and following and we then spoke of the patient's confusion.  We both  agreed that this was likely in most part of the patient being out of her  home environment and the patient's son wanted to after his mother's  breathing issues were stable, return her to her home environment where  she would have less confusion.  However, on the following day on July 21, 2008, the patient's final report showed hip films returned back  showing a femoral neck fracture of the left hip.  This was then  discussed immediately with orthopedic surgery.  Dr. Jerl Santos came and  saw the patient and was able to operate her on that same day.  She  previously had no heart issues other than hypertension and her breathing  issues were stable with normal vital signs with her saturating at 98% on  2 L.  The patient was cleared and after discussion with the son he gave  consent.  The patient was taken to the OR on July 21, 2008.  Patient  tolerated the surgery with no initial complications.  Postop, she did  well.  She looked to be somewhat somnolent even after anesthesia.  There  initially was a question of some increased confusion.  A CT scan of the  head was done which was found to be completely unremarkable.  Patient  remained somewhat somnolent over the next day.  Her anxiety medication,  Xanax, was tapered back.  Her hemoglobin started to trend down and by  July 24, 2008, it was down to 8.2.  She received 2 units packed red  blood cells and by the following day her hemoglobin was up to 13.6.  She  still remained somewhat weak.  She was evaluated by inpatient rehab but  because of her advanced age and the ability to participate aggressively  in rehab recommended more outpatient skilled nursing.  Family requested  [facility] versus [facility] for skilled nursing.  Bed offers were put out  and tentatively at  this time it looks like she has a possible bed  acceptance a [facility].  At that time, speech therapy  were able to note the aspiration issues and she completed the modified  barium swallow.  Speech pathology is recommending a followup modified  barium swallow in 5  to 7 days.  Patient's overall disposition from  initial presentation is improved in terms of her breathing issues, as  well as her now discovery of aspiration.  Speech therapy's feeling is  likely that she has been aspirating for some time and with hopefully  these restrictions her breathing will much improve.   DISCHARGE DIET:  Will be a caffeine-free, low-sodium, dysphagia II diet,  chopped meats, nectar-thick liquids, crushing meds, full supervision at  meals, swallowing 1 to 2 extra times per swallow.   Please note that post procedure in regard to her heart patient had an  episode briefly of paroxysmal A-Fib that was self-controlled.  The first  event occurred on July 20, 2008.  Initially, it was self-contained,  however, a second event occurred.  She had no episodes of severe  hypotension and was otherwise stable but because of requiring Cardizem  drip she was transferred down to the step-down unit on the early morning  hours of July 22, 2008.  After receiving 1 dose of Cardizem, she self-  converted back in normal sinus rhythm.  Dr. Verdis Prime from cardiology  was consulted who evaluated the patient.  A 2D echo was done.  The  results of this were noting a preserved ejection fraction of 70%,  moderate mitral annular calcification, some mild dilatation of the right  ventricle, and the right atrium being dilated but otherwise relatively  stable.  Dr. Katrinka Blazing adjusted the patient's metoprolol which previously  had been at home at 25 b.i.d.  He tapered this up to 50 b.i.d. which the  patient has been able to tolerate.  She has since then had no other  issues and Dr. Katrinka Blazing signed off.   ACTIVITY:  Will be as  per PT/OT of skilled nursing facility.   DISCHARGE MEDICATIONS:  Will be as follows:  1. Xanax 0.25 p.o. b.i.d., dose or frequency to be increased pending      agitation.  2. Albuterol and Atrovent inhalers 4 times a day p.r.n. for shortness      of breath.  3. Metoprolol 50 mg p.o. b.i.d.  4. Thick-It Liquid Thickener for all liquids.  5. Vicodin 5/500 one to 2 tabs p.o. every 4 to 6 hours p.r.n.  6. Zofran 4 mg every 6 hours p.r.n. for nausea.   PLAN:  Will be for followup.  Patient will follow up with Dr. Arvilla Market 1  week after discharge from skilled nursing facility.  She will have a  repeat modified barium swallow done in about 7 days from discharge from  [facility] while she is at rehab.      Hollice Espy, M.D.  Electronically Signed     SKK/MEDQ  D:  07/26/2008  T:  07/26/2008  Job:  [id number]   cc:   Donia Guiles, M.D.  Lyn Records, M.D.  Lubertha Basque Jerl Santos, M.D.  [phone]

## 2010-10-02 NOTE — H&P (Signed)
[name], [name]   MEDICAL RECORD NO.:  [id number]          PATIENT TYPE:  EMS   LOCATION:  MAJO                         FACILITY:  [facility]   PHYSICIAN:  Ramiro Harvest, MD    DATE OF BIRTH:  09/05/1916   DATE OF ADMISSION:  07/18/2008  DATE OF DISCHARGE:                              HISTORY & PHYSICAL   PRIMARY CARE PHYSICIAN:  Dr. Arvilla Market of [facility] physicians.   HISTORY OF PRESENT ILLNESS:  [name] is an 77 year old white  female, resident of assisted living facility with a history of  hypertension, vertigo and diverticulosis, who presents to the ED with a  2-day history of productive cough of greenish sputum, shortness of  breath, some chills, nausea and some substernal chest pain.  The patient  is unable to provide an adequate history and as such, most of the  history was obtained from the ED records.  Per ED records, patient with  a 2-day history of the above symptoms with also some nasal congestion  and generalized weakness.  The patient also endorses some constipation.  The patient denies any fevers.  No nausea, no vomiting, no diarrhea, no  dysuria, no focal neurological symptoms.  The patient unable to further  describe her chest pain.  The patient was seen in the ED and noted to  have a temperature as high as 102.4, some expiratory wheezes.  CBC which  was obtained had a white count of 6.8, hemoglobin of 11.7, hematocrit of  33.9, platelet count of 140 and an absolute neutrophil count of 5.9.  Basic metabolic profile was essentially unremarkable.  Urinalysis was  unremarkable.  Chest x-ray did show emphysematous changes without any  focal disease.  The patient was given some neb treatments, ibuprofen and  some IV fluids in the ED with improvement in her symptoms, as well as  some azithromycin per ED doctor.  We were called to admit the patient  for further evaluation and management.  The patient denied any sick   contacts.   ALLERGIES:  NO KNOWN DRUG ALLERGIES.   PAST MEDICAL HISTORY:  Per ED chart; hypertension, vertigo, questionable  history of a heart murmur and diverticulosis per CT of November 25, 2006.  We will need to verify the patient's medical history per PCP, also per  family at there is no family nearby.   MEDICATIONS:  The patient is unable to name.  We will need again verify  these per family or per PCP.   SOCIAL HISTORY:  The patient lives alone in an assisted living facility.  No tobacco history; however, has a history of secondhand smoke.  No  alcohol use.  No IV drug use.   FAMILY HISTORY:  Noncontributory.   REVIEW OF SYSTEMS:  As per HPI, otherwise negative.   PHYSICAL EXAMINATION:  VITAL SIGNS:  T-max 102.4 down to 98.3, blood  pressure 213/78 down to 163/71, pulse of 92, respiratory rate 20.  Saturating 99% on room air.  GENERAL:  The patient is sleeping in no apparent distress.  HEENT:  Normocephalic, atraumatic.  Pupils equal, round and reactive to  light and accommodation.  Extraocular movements intact.  Oropharynx is  clear.  No lesions, no exudates.  NECK:  Supple.  No lymphadenopathy.  RESPIRATORY:  Bibasilar coarse breath sounds, left greater than right.  CARDIOVASCULAR:  Regular rate and rhythm.  No murmurs, rubs or gallops.  ABDOMEN:  Soft, nontender, nondistended.  Positive bowel sounds.  EXTREMITIES:  No clubbing, cyanosis or edema.  NEUROLOGICAL:  The patient is alert and oriented x3.  Cranial nerves II-  XII are grossly intact.  No focal deficits.   ADMISSION LABORATORY DATA:  Chest x-ray shows emphysematous changes  without focal disease.  CBC with a white count of 6.8, hemoglobin 11.7,  hematocrit 33.9, platelet count of 140, ANC of 5.9.  Basic metabolic  profile; sodium 136, potassium 3.8, chloride 105, bicarb 24, glucose  114, BUN 14, creatinine 0.92 and a calcium of 8.4.  Urinalysis was  yellow clear, specific gravity 1.017, pH of 7.5, glucose  negative,  bilirubin negative, ketones 15, blood small, protein 30, urobilinogen  1.0, nitrite negative, leukocytes negative.  Microscopy; WBC 0-2, RBCs 0-  2.  Blood cultures are pending.   ASSESSMENT AND PLAN:  [name] is an 77 year old female with no  significant past medical history except for hypertension and vertigo who  presented to the emergency department with a 2-day history of a  productive cough of greenish sputum, some shortness of breath, some  chills, as well as some expiratory wheezing per emergency department  doctor's examination.   1. Cough/shortness of breath/chest pain.  Differential includes      pneumonia versus bronchitis versus a chronic obstructive pulmonary      disease exacerbation.  Will check a sputum gram-stain and culture.      Check a magnesium level.  Check an EKG.  Check a hepatic panel.      Cycle cardiac enzymes q.12 h., x2.  Check a repeat chest x-ray.  We      will place on empiric antibiotics of Avelox, nebulizer treatments,      Mucinex, oxygen and IV fluids and will follow and treat      symptomatically.  2. Weakness, likely secondary to problem #1.  Cycle cardiac enzymes      q.12 h., x2.  Check an EKG.  Check orthostatics.  Check a magnesium      level.  Hydrate with IV fluids, physical therapy/occupational      therapy and follow.  3. Hypertension.  Unable to verify the patient's medications.  We will      hold blood pressure medications for now.  If needed, Norvasc may be      started.  We will monitor for .  4. Diverticulosis.  5. Vertigo.  6. Anemia.  We will check an anemia panel, guaiac stools and follow      hemoglobin and hematocrit.  7. Thrombocytopenia.  Check a peripheral smear, check an anemia panel,      check LDH, check a haptoglobin, check coags.  We will follow.  8. Prophylaxis.  Pepcid for gastrointestinal prophylaxis.  Sequential      compression devices for deep venous thrombosis prophylaxis.   It has been a  pleasure taking care of [name].      Ramiro Harvest, MD  Electronically Signed     DT/MEDQ  D:  07/19/2008  T:  07/19/2008  Job:  [id number]   cc:   [name], M.D.

## 2010-10-02 NOTE — H&P (Signed)
NAME:  [name], BOWERY NO.:  [id number]   MEDICAL RECORD NO.:  [id number]          PATIENT TYPE:  INP   LOCATION:  1827                         FACILITY:  [facility]   PHYSICIAN:  Michiel Cowboy, MDDATE OF BIRTH:  Feb 19, 1917   DATE OF ADMISSION:  01/02/2009  DATE OF DISCHARGE:                              HISTORY & PHYSICAL   PRIMARY CARE PHYSICIAN:  Dr. Arvilla Market.   CHIEF COMPLAINTS:  Sleepiness and lethargy.   The patient is a 77 year old female with past medical history  significant for dementia, paroxysmal atrial fibrillation as well as  COPD.  The patient for the past 2 or more weeks has been having lower  extremity edema and recently started to develop more of a redness  bilaterally as well as itching.  Her legs were itching.  Reportedly she  had been scratching her legs as well as her upper body.  Her daughter-in-  law became concerned and brought her some Benadryl as well as  hydrocortisone cream to apply to the legs.  Despite moderate dementia,  the patient does live by herself and self-administers her medications.  She was told to take Benadryl by her daughter-in-law no more than every  6 hours.  It is not quite sure how much she actually has taken, but when  today her daughter went to check on her, she was asleep and although  arousable, drifted back to sleep, at which point she was brought into  the emergency department.  In ED, she was noted to be bradycardic down  to 40s or so and was started on glucagon drip.  The patient herself also  takes her own medications, including metoprolol.  Her family thinks that  she has been taking it accurately but cannot be quite sure.   The patient is pleasantly confused, lethargic but arouses to voice.   REVIEW OF SYSTEMS:  Unable to obtain.   PAST MEDICAL HISTORY:  1. Significant for left femoral neck fracture.  2. Atrial fibrillation.  3. Chronic obstructive pulmonary disease.  4. Aspiration pneumonia.  5.  Dysphagia.  6. Delirium.  7. Hypertension.  8. Decondition.  9. Anemia.   SOCIAL HISTORY:  The patient lives alone at home.  She does not want to  live with her family, very independent, according to her son.  Does not  smoke or drink, does not abuse drugs.  Widow of 30 years.   FAMILY HISTORY:  Noncontributory.   ALLERGIES:  NO KNOWN DRUG ALLERGIES.   MEDICATIONS:  1. According to her family, she takes alprazolam usually 0.25 to 0.5      mg twice a day or so, but per family, she has been taking it      randomly, and they cannot quite know how often she actually has      been taking it.  2. Ibuprofen once or twice a week as needed for pain.  3. Benadryl, recently started, 25 mg every 6 hours.  4. Metoprolol 50 mg in the morning.  5. Namenda 5 mg twice daily.  6. Amlodipine 12.5 mg once a day.  PHYSICAL EXAMINATION:  VITALS:  Temperature 97.3, blood pressure 149/56,  pulse initially 43, now up to the mid 50s, respirations 18, satting 100%  on room air.  The patient appears to be in no acute distress, pleasant.  Head nontraumatic, slightly diminished skin turgor and slightly dry  mucous membranes.  LUNGS:  Clear to auscultation bilaterally.  HEART: Regular rate and rhythm.  No murmurs appreciated but somewhat  slow.  ABDOMEN:  Soft, nontender, nondistended.  LOWER EXTREMITIES:  With 2+ edema bilaterally.  No erythema noted.  Some  superficial skin discoloration noted.   LABS:  Hemoglobin 10.9, sodium 144, potassium 4.3, creatinine 1.  Cardiac enzymes negative.   Chest x-ray showing COPD.   EKG showing heart rate in 40s and junction escape rhythm.   INR 1.1.   ASSESSMENT/PLAN:  This is a 77 year old female somewhat lethargic, most  likely secondary to taking too much Benadryl with slow heart rates.  This also could be either attributed to over-sedation versus taking a  higher amount of metoprolol than she should be.  Secondary to her  dementia, it is hard to say if  she is actually taking her present  medications as prescribed.  1. Bradycardia:  Her case was discussed with Dr. Donnie Aho, who felt that      she would probably be best suited in medicine service.  Wanted to      go ahead and hold her beta blocker and continued to monitor her.      Given that her heart rate is still fairly slow, will admit to step-      down.  For now will continue to do Glucagon drips since it has been      started and have possibly a question of beneficial effects and also      since I cannot completely rule out beta blocker overdose at this      point.  Will cycle cardiac markers.  2. Lower extremity edema.  She has history of right-sided heart      failure per echogram.  At this point, her fluid status is      debatable.  Will check albumin level and hold off on IV fluids for      now.  Will check orthostatics once able to stand up, check BNP      level.  3. Hypertension.  Will hold metoprolol and Norvasc.  4. Prophylaxis:  Protonix and Lovenox.  5. Altered mental status, most likely secondary to Benadryl but will      obtain CT scan of the head and check UA for urine infection and      monitor.  6. Code status:  The patient does not wish to be resuscitated or      intubated or given medications while her heart rate is okay.  Did      not address particularly defibrillation.  If becomes an issue, will      need to be addressed with family.      Michiel Cowboy, MD  Electronically Signed     AVD/MEDQ  D:  01/02/2009  T:  01/02/2009  Job:  [id number]   cc:   Donia Guiles, M.D.

## 2012-03-21 ENCOUNTER — Emergency Department (HOSPITAL_COMMUNITY)
Admission: EM | Admit: 2012-03-21 | Discharge: 2012-03-21 | Disposition: A | Discharge status: Home / Self Care | Payer: Medicare Other | Attending: Emergency Medicine | Admitting: Emergency Medicine

## 2012-03-21 ENCOUNTER — Encounter (HOSPITAL_COMMUNITY): Payer: Self-pay | Admitting: Emergency Medicine

## 2012-03-21 DIAGNOSIS — F0280 Dementia in other diseases classified elsewhere without behavioral disturbance: Secondary | ICD-10-CM | POA: Insufficient documentation

## 2012-03-21 DIAGNOSIS — Z8659 Personal history of other mental and behavioral disorders: Secondary | ICD-10-CM | POA: Insufficient documentation

## 2012-03-21 DIAGNOSIS — G309 Alzheimer's disease, unspecified: Secondary | ICD-10-CM | POA: Insufficient documentation

## 2012-03-21 DIAGNOSIS — I10 Essential (primary) hypertension: Secondary | ICD-10-CM | POA: Insufficient documentation

## 2012-03-21 DIAGNOSIS — J4489 Other specified chronic obstructive pulmonary disease: Secondary | ICD-10-CM | POA: Insufficient documentation

## 2012-03-21 DIAGNOSIS — K219 Gastro-esophageal reflux disease without esophagitis: Secondary | ICD-10-CM | POA: Insufficient documentation

## 2012-03-21 DIAGNOSIS — J449 Chronic obstructive pulmonary disease, unspecified: Secondary | ICD-10-CM | POA: Insufficient documentation

## 2012-03-21 DIAGNOSIS — L253 Unspecified contact dermatitis due to other chemical products: Secondary | ICD-10-CM | POA: Insufficient documentation

## 2012-03-21 DIAGNOSIS — L232 Allergic contact dermatitis due to cosmetics: Secondary | ICD-10-CM

## 2012-03-21 HISTORY — DX: Unspecified dementia, unspecified severity, without behavioral disturbance, psychotic disturbance, mood disturbance, and anxiety: F03.90

## 2012-03-21 HISTORY — DX: Alzheimer's disease, unspecified: G30.9

## 2012-03-21 HISTORY — DX: Dementia in other diseases classified elsewhere, unspecified severity, without behavioral disturbance, psychotic disturbance, mood disturbance, and anxiety: F02.80

## 2012-03-21 HISTORY — DX: Anxiety disorder, unspecified: F41.9

## 2012-03-21 HISTORY — DX: Chronic obstructive pulmonary disease, unspecified: J44.9

## 2012-03-21 HISTORY — DX: Essential (primary) hypertension: I10

## 2012-03-21 HISTORY — DX: Gastro-esophageal reflux disease without esophagitis: K21.9

## 2012-03-21 MED ORDER — LISINOPRIL 10 MG PO TABS
10.0000 mg | ORAL_TABLET | Freq: Once | ORAL | Status: AC
  Administered 2012-03-21: 10 mg via ORAL
  Filled 2012-03-21: qty 1

## 2012-03-21 MED ORDER — DIPHENHYDRAMINE HCL 25 MG PO CAPS
25.0000 mg | ORAL_CAPSULE | Freq: Once | ORAL | Status: AC
  Administered 2012-03-21: 25 mg via ORAL
  Filled 2012-03-21: qty 1

## 2012-03-21 MED ORDER — PREDNISONE 20 MG PO TABS
60.0000 mg | ORAL_TABLET | Freq: Once | ORAL | Status: AC
  Administered 2012-03-21: 60 mg via ORAL
  Filled 2012-03-21: qty 3

## 2012-03-21 MED ORDER — FAMOTIDINE 20 MG PO TABS
20.0000 mg | ORAL_TABLET | Freq: Once | ORAL | Status: AC
  Administered 2012-03-21: 20 mg via ORAL
  Filled 2012-03-21: qty 1

## 2012-03-21 MED ORDER — DIPHENHYDRAMINE HCL 25 MG PO TABS: 25.0000 mg | ORAL_TABLET | Freq: Four times a day (QID) | ORAL | Status: DC | PRN

## 2012-03-21 NOTE — ED Provider Notes (Signed)
Patient reassessed.  Periorbital redness and swelling have improved.  No respiratory distress.  Patient will be discharged home with family.    Jimmye Norman, NP 03/21/12 [id number]

## 2012-03-21 NOTE — ED Notes (Signed)
Care transferred and report given to Greg, RN. 

## 2012-03-21 NOTE — ED Provider Notes (Signed)
Garnie Derico is a 78 y.o. female in CDU from pod A. Sign out from Dr. Ranae Palms as follows: Patient had a mild allergic reaction that was not treated with epinephrine. She did receive Benadryl, Pepcid, and prednisone. She was observed in the pod for several hours. He would like to more hours of observation before she is discharged. Patient has erythema and swelling around the eyes. He thinks that the allergic reaction is secondary to a lotion she applied to this area.  Upon her arrival to the CDU was noted that her blood pressure was high we gave her her normal dose of lisinopril. Patient seen and examined at the bedside she is demented but verbal nonsensical words. There is periorbital erythema with mild swelling no respiratory distress, lung sounds are clear to auscultation bilaterally, no accessory muscle use or stridor.Marland Kitchen Heart is a regular rate and rhythm with no murmurs rubs or gallops and abdominal exam is benign with no tenderness to palpation.  Pt signed out to NP smith at shift change.    Wynetta Emery, PA-C 03/23/12 [phone]

## 2012-03-21 NOTE — ED Notes (Signed)
Per EMS, patient "got into some ivory body wash this morning and her face / eyes swelled".   Patient is disoriented at baseline.   Patient claims she doesn't remember getting into soap.

## 2012-03-21 NOTE — ED Provider Notes (Signed)
History     CSN: [id number]  Arrival date & time 03/21/12  1034   First MD Initiated Contact with Patient 03/21/12 1038      Chief Complaint  Patient presents with  . Allergic Reaction    (Consider location/radiation/quality/duration/timing/severity/associated sxs/prior treatment) HPI Pt with dementia at baseline lives in [place]. Staff noticed bl eye swelling this morning after pt washed her face. Family states she has used body wash for face wash in the past with similar results. Pt is unable to give details of history due to baseline dementia. Level V Caveat.  Past Medical History  Diagnosis Date  . GERD (gastroesophageal reflux disease)   . Anxiety   . Dementia   . Alzheimer disease   . COPD (chronic obstructive pulmonary disease)   . Hypertension     No past surgical history on file.  No family history on file.  History  Substance Use Topics  . Smoking status: Never Smoker   . Smokeless tobacco: Not on file  . Alcohol Use: No    OB History    Grav Para Term Preterm Abortions TAB SAB Ect Mult Living                  Review of Systems  Unable to perform ROS: Dementia    Allergies  Review of patient's allergies indicates no known allergies.  Home Medications  No current outpatient prescriptions on file.  BP 201/65  Pulse 84  Temp 97.8 F (36.6 C) (Oral)  Resp 16  SpO2 98%  Physical Exam  Nursing note and vitals reviewed. Constitutional: She appears well-developed and well-nourished. No distress.  HENT:  Head: Atraumatic.  Mouth/Throat: Oropharynx is clear and moist.       bl periorbital edema. No lip or tongue swelling  Eyes: EOM are normal. Pupils are equal, round, and reactive to light.  Neck: Normal range of motion. Neck supple.  Cardiovascular: Normal rate and regular rhythm.   Pulmonary/Chest: Effort normal and breath sounds normal. Stridor present. No respiratory distress. She has no wheezes. She has no rales. She exhibits no tenderness.    Abdominal: Soft. Bowel sounds are normal. She exhibits no distension. There is no tenderness. There is no rebound and no guarding.  Musculoskeletal: Normal range of motion. She exhibits no edema and no tenderness.  Neurological: She is alert.       Moves all ext  Skin: Skin is warm and dry. No rash noted. No erythema.  Psychiatric: She has a normal mood and affect. Her behavior is normal.    ED Course  Procedures (including critical care time)  Labs Reviewed - No data to display No results found.   No diagnosis found.    MDM          Loren Racer, MD 03/21/12 [phone]

## 2012-03-22 NOTE — ED Provider Notes (Signed)
Medical screening examination/treatment/procedure(s) were performed by non-physician practitioner and as supervising physician I was immediately available for consultation/collaboration.   Glynn Octave, MD 03/22/12 1146

## 2012-03-24 NOTE — ED Provider Notes (Signed)
Medical screening examination/treatment/procedure(s) were performed by non-physician practitioner and as supervising physician I was immediately available for consultation/collaboration.   Loren Racer, MD 03/24/12 [phone]

## 2012-05-03 ENCOUNTER — Encounter (HOSPITAL_COMMUNITY): Payer: Self-pay | Admitting: Emergency Medicine

## 2012-05-03 ENCOUNTER — Emergency Department (HOSPITAL_COMMUNITY)
Admission: EM | Admit: 2012-05-03 | Discharge: 2012-05-04 | Disposition: A | Discharge status: Home / Self Care | Payer: Medicare Other | Attending: Emergency Medicine | Admitting: Emergency Medicine

## 2012-05-03 ENCOUNTER — Emergency Department (HOSPITAL_COMMUNITY): Payer: Medicare Other

## 2012-05-03 DIAGNOSIS — Y939 Activity, unspecified: Secondary | ICD-10-CM | POA: Insufficient documentation

## 2012-05-03 DIAGNOSIS — J4489 Other specified chronic obstructive pulmonary disease: Secondary | ICD-10-CM | POA: Insufficient documentation

## 2012-05-03 DIAGNOSIS — F0280 Dementia in other diseases classified elsewhere without behavioral disturbance: Secondary | ICD-10-CM | POA: Insufficient documentation

## 2012-05-03 DIAGNOSIS — S0003XA Contusion of scalp, initial encounter: Secondary | ICD-10-CM | POA: Insufficient documentation

## 2012-05-03 DIAGNOSIS — I10 Essential (primary) hypertension: Secondary | ICD-10-CM | POA: Insufficient documentation

## 2012-05-03 DIAGNOSIS — Z8719 Personal history of other diseases of the digestive system: Secondary | ICD-10-CM | POA: Insufficient documentation

## 2012-05-03 DIAGNOSIS — Z79899 Other long term (current) drug therapy: Secondary | ICD-10-CM | POA: Insufficient documentation

## 2012-05-03 DIAGNOSIS — Y929 Unspecified place or not applicable: Secondary | ICD-10-CM | POA: Insufficient documentation

## 2012-05-03 DIAGNOSIS — G309 Alzheimer's disease, unspecified: Secondary | ICD-10-CM | POA: Insufficient documentation

## 2012-05-03 DIAGNOSIS — J449 Chronic obstructive pulmonary disease, unspecified: Secondary | ICD-10-CM | POA: Insufficient documentation

## 2012-05-03 DIAGNOSIS — S1093XA Contusion of unspecified part of neck, initial encounter: Secondary | ICD-10-CM | POA: Insufficient documentation

## 2012-05-03 DIAGNOSIS — W19XXXA Unspecified fall, initial encounter: Secondary | ICD-10-CM | POA: Insufficient documentation

## 2012-05-03 DIAGNOSIS — F411 Generalized anxiety disorder: Secondary | ICD-10-CM | POA: Insufficient documentation

## 2012-05-03 LAB — CBC WITH DIFFERENTIAL/PLATELET
Basophils Relative: 1 % (ref 0–1)
Eosinophils Absolute: 0.3 10*3/uL (ref 0.0–0.7)
MCH: 30.1 pg (ref 26.0–34.0)
MCHC: 32.7 g/dL (ref 30.0–36.0)
Neutrophils Relative %: 49 % (ref 43–77)
Platelets: 185 10*3/uL (ref 150–400)
RDW: 14.1 % (ref 11.5–15.5)

## 2012-05-03 LAB — COMPREHENSIVE METABOLIC PANEL
ALT: 11 U/L (ref 0–35)
Albumin: 3 g/dL — ABNORMAL LOW (ref 3.5–5.2)
Alkaline Phosphatase: 71 U/L (ref 39–117)
BUN: 24 mg/dL — ABNORMAL HIGH (ref 6–23)
Potassium: 3.6 mEq/L (ref 3.5–5.1)
Sodium: 140 mEq/L (ref 135–145)
Total Protein: 5.8 g/dL — ABNORMAL LOW (ref 6.0–8.3)

## 2012-05-03 LAB — URINALYSIS, ROUTINE W REFLEX MICROSCOPIC
Glucose, UA: NEGATIVE mg/dL
Ketones, ur: NEGATIVE mg/dL
Leukocytes, UA: NEGATIVE
pH: 5.5 (ref 5.0–8.0)

## 2012-05-03 MED ORDER — SODIUM CHLORIDE 0.9 % IV SOLN
INTRAVENOUS | Status: DC
  Administered 2012-05-03: 23:00:00 via INTRAVENOUS

## 2012-05-03 NOTE — ED Notes (Signed)
Pt from [facility]. Pt hx alzheimer. Staff notices today that she is less independent which is different than how she has been and also increased weakness. Family visited today at 1400 and saw that she was "trembling" family requested that she take her xanax which she doesn't normally take but is prescribed to her. Staff gave her the xanax and later staff found her on the floor from bed. Same sx continue from before fall; nothing different. Pt is currently resting in bed. bp 130 systolic. Hr 60's.

## 2012-05-03 NOTE — ED Notes (Signed)
Pt transported to xray 

## 2012-05-03 NOTE — ED Notes (Signed)
Pt returned from CT °

## 2012-05-04 NOTE — ED Notes (Signed)
Placed call to [facility] for transportation back to [facility]

## 2012-05-04 NOTE — ED Provider Notes (Signed)
History     CSN: [id number]  Arrival date & time 05/03/12  2207   First MD Initiated Contact with Patient 05/03/12 2255      Chief Complaint  Patient presents with  . Dementia    (Consider location/radiation/quality/duration/timing/severity/associated sxs/prior treatment) HPI Level 5 caveat due to dementia Pt brought to the ED via EMS. Per nursing report, the patient has been 'less independent' today. She was apparently having tremor earlier today and so family asked that she be given a Xanax which she rarely takes. She was found on the floor of her room a short time prior to arrival and sent to the ED for eval.   Past Medical History  Diagnosis Date  . GERD (gastroesophageal reflux disease)   . Anxiety   . Dementia   . Alzheimer disease   . COPD (chronic obstructive pulmonary disease)   . Hypertension     History reviewed. No pertinent past surgical history.  No family history on file.  History  Substance Use Topics  . Smoking status: Never Smoker   . Smokeless tobacco: Not on file  . Alcohol Use: No    OB History    Grav Para Term Preterm Abortions TAB SAB Ect Mult Living                  Review of Systems Unable to assess due to mental status.    Allergies  Review of patient's allergies indicates no known allergies.  Home Medications   Current Outpatient Rx  Name  Route  Sig  Dispense  Refill  . ACETAMINOPHEN 500 MG PO TABS   Oral   Take 500 mg by mouth every 4 (four) hours as needed. For pain         . ALPRAZOLAM 1 MG PO TABS   Oral   Take 1 mg by mouth 2 (two) times daily as needed. For anxiety         . DONEPEZIL HCL 10 MG PO TABS   Oral   Take 10 mg by mouth at bedtime.         . GUAIFENESIN 100 MG/5ML PO LIQD   Oral   Take 200 mg by mouth 3 (three) times daily as needed. For cough         . LISINOPRIL 10 MG PO TABS   Oral   Take 10 mg by mouth daily.         Marland Kitchen LOPERAMIDE HCL 2 MG PO TABS   Oral   Take 2 mg by mouth daily  as needed. For loose stools         . MAGNESIUM HYDROXIDE 800 MG/5ML PO SUSP   Oral   Take 30 mLs by mouth daily as needed. For constipation.         Marland Kitchen MAALOX ANTI-GAS EXTRA STRENGTH PO   Oral   Take 30 mLs by mouth daily as needed. For heartburn or indigestion.           BP 176/69  Pulse 62  Temp 97.1 F (36.2 C) (Rectal)  Resp 14  SpO2 100%  Physical Exam  Nursing note and vitals reviewed. Constitutional: She appears well-developed and well-nourished.  HENT:  Head: Normocephalic.       L forehead hematoma  Eyes: EOM are normal. Pupils are equal, round, and reactive to light.  Neck: Normal range of motion. Neck supple.  Cardiovascular: Normal rate, normal heart sounds and intact distal pulses.   Pulmonary/Chest: Effort normal and breath  sounds normal.  Abdominal: Bowel sounds are normal. She exhibits no distension. There is no tenderness.  Musculoskeletal: Normal range of motion. She exhibits no edema and no tenderness.  Neurological:       Difficult to fully assess due to sleepiness but arouses to verbal stimuli and follow commands  Skin: Skin is warm and dry. No rash noted.  Psychiatric: She has a normal mood and affect.    ED Course  Procedures (including critical care time)  Labs Reviewed  CBC WITH DIFFERENTIAL - Abnormal; Notable for the following:    RBC 3.86 (*)     Hemoglobin 11.6 (*)     HCT 35.5 (*)     Eosinophils Relative 7 (*)     All other components within normal limits  COMPREHENSIVE METABOLIC PANEL - Abnormal; Notable for the following:    BUN 24 (*)     Creatinine, Ser 1.23 (*)     Total Protein 5.8 (*)     Albumin 3.0 (*)     Total Bilirubin 0.2 (*)     GFR calc non Af Amer 37 (*)     GFR calc Af Amer 42 (*)     All other components within normal limits  URINALYSIS, ROUTINE W REFLEX MICROSCOPIC  TROPONIN I  URINE CULTURE   Ct Head Wo Contrast  05/04/2012  *RADIOLOGY REPORT*  Clinical Data: Weakness, altered mental status.  CT  HEAD WITHOUT CONTRAST  Technique:  Contiguous axial images were obtained from the base of the skull through the vertex without contrast.  Comparison: 08/23/2010  Findings: Prominence of the sulci, cisterns, and ventricles, in keeping with volume loss. There are subcortical and periventricular white matter hypodensities, a nonspecific finding most often seen with chronic microangiopathic changes.  There is no evidence for acute hemorrhage, overt hydrocephalus, mass lesion, or abnormal extra-axial fluid collection.  No definite CT evidence for acute cortical based (large artery) infarction. Left frontal scalp swelling.  No underlying calvarial fracture.The visualized paranasal sinuses and mastoid air cells are predominately clear.  IMPRESSION: Left frontal scalp swelling. No underlying calvarial fracture.  White matter changes and volume loss without definite CT evidence of acute intracranial abnormality.   Original Report Authenticated By: Jearld Lesch, M.D.      No diagnosis found.    MDM   Date: 05/04/2012  Rate: 60  Rhythm: normal sinus rhythm  QRS Axis: normal  Intervals: QT prolonged  ST/T Wave abnormalities: normal  Conduction Disutrbances:first-degree A-V block   Narrative Interpretation:   Old EKG Reviewed: none available  Labs and imaging unremarkable. Family member at the bedside states the only change he noticed today was that she was 'more irritable' after church but that this is not uncommon for her on the weekends. He is comfortable with the patient going back to SNF.         Charles B. Bernette Mayers, MD 05/04/12 [phone]

## 2012-05-04 NOTE — ED Notes (Signed)
Pt re-roomed for [facility] arrival. Pt placed back on cardiac monitor.

## 2012-05-05 LAB — URINE CULTURE: Colony Count: NO GROWTH

## 2012-09-02 ENCOUNTER — Encounter (HOSPITAL_COMMUNITY): Payer: Self-pay | Admitting: *Deleted

## 2012-09-02 ENCOUNTER — Emergency Department (HOSPITAL_COMMUNITY)
Admission: EM | Admit: 2012-09-02 | Discharge: 2012-09-02 | Disposition: A | Discharge status: Home / Self Care | Payer: Medicare Other | Attending: Emergency Medicine | Admitting: Emergency Medicine

## 2012-09-02 ENCOUNTER — Emergency Department (HOSPITAL_COMMUNITY): Payer: Medicare Other

## 2012-09-02 DIAGNOSIS — I10 Essential (primary) hypertension: Secondary | ICD-10-CM | POA: Insufficient documentation

## 2012-09-02 DIAGNOSIS — IMO0002 Reserved for concepts with insufficient information to code with codable children: Secondary | ICD-10-CM | POA: Insufficient documentation

## 2012-09-02 DIAGNOSIS — Z79899 Other long term (current) drug therapy: Secondary | ICD-10-CM | POA: Insufficient documentation

## 2012-09-02 DIAGNOSIS — F0280 Dementia in other diseases classified elsewhere without behavioral disturbance: Secondary | ICD-10-CM | POA: Insufficient documentation

## 2012-09-02 DIAGNOSIS — Z8719 Personal history of other diseases of the digestive system: Secondary | ICD-10-CM | POA: Insufficient documentation

## 2012-09-02 DIAGNOSIS — J4489 Other specified chronic obstructive pulmonary disease: Secondary | ICD-10-CM | POA: Insufficient documentation

## 2012-09-02 DIAGNOSIS — G309 Alzheimer's disease, unspecified: Secondary | ICD-10-CM | POA: Insufficient documentation

## 2012-09-02 DIAGNOSIS — R451 Restlessness and agitation: Secondary | ICD-10-CM

## 2012-09-02 DIAGNOSIS — F411 Generalized anxiety disorder: Secondary | ICD-10-CM | POA: Insufficient documentation

## 2012-09-02 DIAGNOSIS — F0390 Unspecified dementia without behavioral disturbance: Secondary | ICD-10-CM

## 2012-09-02 DIAGNOSIS — J449 Chronic obstructive pulmonary disease, unspecified: Secondary | ICD-10-CM | POA: Insufficient documentation

## 2012-09-02 LAB — COMPREHENSIVE METABOLIC PANEL
ALT: 12 U/L (ref 0–35)
Albumin: 3.7 g/dL (ref 3.5–5.2)
Alkaline Phosphatase: 93 U/L (ref 39–117)
BUN: 19 mg/dL (ref 6–23)
Potassium: 3.8 mEq/L (ref 3.5–5.1)
Sodium: 141 mEq/L (ref 135–145)
Total Protein: 6.6 g/dL (ref 6.0–8.3)

## 2012-09-02 LAB — URINALYSIS, ROUTINE W REFLEX MICROSCOPIC
Glucose, UA: NEGATIVE mg/dL
Ketones, ur: NEGATIVE mg/dL
Leukocytes, UA: NEGATIVE
pH: 6.5 (ref 5.0–8.0)

## 2012-09-02 LAB — CBC WITH DIFFERENTIAL/PLATELET
Basophils Relative: 1 % (ref 0–1)
Eosinophils Absolute: 0.1 10*3/uL (ref 0.0–0.7)
MCH: 30.2 pg (ref 26.0–34.0)
MCHC: 33.2 g/dL (ref 30.0–36.0)
Neutrophils Relative %: 60 % (ref 43–77)
Platelets: 195 10*3/uL (ref 150–400)
RDW: 13.6 % (ref 11.5–15.5)

## 2012-09-02 MED ORDER — RISPERIDONE 0.25 MG PO TABS: 0.2500 mg | ORAL_TABLET | Freq: Every day | ORAL | Status: DC

## 2012-09-02 MED ORDER — RISPERIDONE 0.5 MG PO TABS
0.2500 mg | ORAL_TABLET | Freq: Once | ORAL | Status: AC
  Administered 2012-09-02: 0.25 mg via ORAL
  Filled 2012-09-02: qty 1

## 2012-09-02 NOTE — ED Notes (Signed)
Spoke with a nurse at the [facility] who reports today noticed pt has unsteady gait, and was talking about devil and people trying to kill her. Nurse sts pt has hx of alzheimers but this behavior is not normal for pt. Also reports pt has HTN and temp.was 100.3. NH staff wants the pt to be checked for UTI.

## 2012-09-02 NOTE — ED Notes (Signed)
Per EMS pt coming from [facility] with c/o altered mental status and confusion. Per EMS pt has hx dementia, but staff reported pt has been acting more confused than usual. Per EMS pt agitated and combative.

## 2012-09-02 NOTE — Progress Notes (Signed)
[facility] is Assisted Living.   Catha Gosselin, LCSWA  [phone] .09/02/2012  1749pm

## 2012-09-02 NOTE — ED Notes (Signed)
ZOX:[id number]<AV> Expected date:<BR> Expected time:<BR> Means of arrival:Ambulance<BR> Comments:<BR> 89 yof- altered LOC

## 2012-09-02 NOTE — ED Provider Notes (Signed)
History     CSN: [id number]  Arrival date & time 09/02/12  1645   First MD Initiated Contact with Patient 09/02/12 1657      Chief Complaint  Patient presents with  . Altered Mental Status    (Consider location/radiation/quality/duration/timing/severity/associated sxs/prior treatment) HPI This 79 year old female with dementia is oriented to self only and unable to provide any significant history, she is sent by her nursing home due to increased agitated combative behavior over an unknown time period, the patient denies any threats or herself or others, the patient however says that as she is trying to fight hit and pull away from nursing staff who were trying to assist her. She denies any pain shortness breath or other concerns. She has no idea where she is or why she is here. Past Medical History  Diagnosis Date  . GERD (gastroesophageal reflux disease)   . Anxiety   . Dementia   . Alzheimer disease   . COPD (chronic obstructive pulmonary disease)   . Hypertension     History reviewed. No pertinent past surgical history.  History reviewed. No pertinent family history.  History  Substance Use Topics  . Smoking status: Never Smoker   . Smokeless tobacco: Not on file  . Alcohol Use: No    OB History   Grav Para Term Preterm Abortions TAB SAB Ect Mult Living                  Review of Systems  Unable to perform ROS: Dementia    Allergies  Review of patient's allergies indicates no known allergies.  Home Medications   Current Outpatient Rx  Name  Route  Sig  Dispense  Refill  . acetaminophen (TYLENOL) 500 MG tablet   Oral   Take 500 mg by mouth every 4 (four) hours as needed. For pain         . ALPRAZolam (XANAX) 0.25 MG tablet   Oral   Take 0.25 mg by mouth 2 (two) times daily as needed for anxiety.         . donepezil (ARICEPT) 10 MG tablet   Oral   Take 10 mg by mouth at bedtime.         Marland Kitchen guaiFENesin (ROBITUSSIN) 100 MG/5ML liquid   Oral  Take 200 mg by mouth 3 (three) times daily as needed. For cough         . lisinopril (PRINIVIL,ZESTRIL) 10 MG tablet   Oral   Take 10 mg by mouth daily.         Marland Kitchen loperamide (LOPERAMIDE A-D) 2 MG tablet   Oral   Take 2 mg by mouth 4 (four) times daily as needed for diarrhea or loose stools.          . magnesium hydroxide (MILK OF MAGNESIA) 800 MG/5ML suspension   Oral   Take 30 mLs by mouth daily as needed. For constipation.         . Simethicone (MAALOX ANTI-GAS EXTRA STRENGTH PO)   Oral   Take 30 mLs by mouth daily as needed. For heartburn or indigestion.         . risperiDONE (RISPERDAL) 0.25 MG tablet   Oral   Take 1 tablet (0.25 mg total) by mouth at bedtime.   30 tablet   0     BP 152/98  Pulse 85  Temp(Src) 99.1 F (37.3 C) (Oral)  Resp 18  SpO2 97%  Physical Exam  Nursing note and vitals reviewed. Constitutional:  Awake, alert, nontoxic appearance.  HENT:  Head: Atraumatic.  Eyes: Right eye exhibits no discharge. Left eye exhibits no discharge.  Neck: Neck supple.  Cardiovascular: Normal rate and regular rhythm.   No murmur heard. Pulmonary/Chest: Effort normal and breath sounds normal. No respiratory distress. She has no wheezes. She has no rales. She exhibits no tenderness.  Abdominal: Soft. Bowel sounds are normal. She exhibits no distension. There is no tenderness. There is no rebound and no guarding.  Musculoskeletal: She exhibits no edema and no tenderness.  Baseline ROM, no obvious new focal weakness.  Neurological: She is alert.  Mental status appears alert anxious agitated occasionally follows a few simple commands and motor strength appears 5/5 bilat  Skin: No rash noted.  Psychiatric:  Anxious agitated    ED Course  Procedures (including critical care time) ECG: Sinus arrhythmia, ventricular rate 91, normal axis, no acute ischemic changes noted, no significant change noted compared with January 2011  Screening labs and imaging  unremarkable so TelePsych consulted, per ED RN Pt's NH will take Pt back if behavior controlled. 1910  Pt resting calmly in ED; Telepsych recs Risperdal 0.25mg  po qHS. 2140     Labs Reviewed  COMPREHENSIVE METABOLIC PANEL - Abnormal; Notable for the following:    Glucose, Bld 105 (*)    Creatinine, Ser 1.18 (*)    GFR calc non Af Amer 39 (*)    GFR calc Af Amer 45 (*)    All other components within normal limits  CBC WITH DIFFERENTIAL  URINALYSIS, ROUTINE W REFLEX MICROSCOPIC  CG4 I-STAT (LACTIC ACID)   No results found.   1. Agitation   2. Dementia       MDM  The patient appears reasonably screened and/or stabilized for discharge and I doubt any other medical condition or other [facility] requiring further screening, evaluation, or treatment in the ED at this time prior to discharge.        Hurman Horn, MD 09/04/12 [phone]

## 2012-09-02 NOTE — ED Notes (Signed)
Paperwork faxed for telepsych

## 2012-12-06 ENCOUNTER — Emergency Department (HOSPITAL_COMMUNITY): Payer: Medicare Other

## 2012-12-06 ENCOUNTER — Emergency Department (HOSPITAL_COMMUNITY)
Admission: EM | Admit: 2012-12-06 | Discharge: 2012-12-06 | Disposition: A | Discharge status: Home / Self Care | Payer: Medicare Other | Attending: Emergency Medicine | Admitting: Emergency Medicine

## 2012-12-06 ENCOUNTER — Encounter (HOSPITAL_COMMUNITY): Payer: Self-pay | Admitting: *Deleted

## 2012-12-06 DIAGNOSIS — Y9210 Unspecified residential institution as the place of occurrence of the external cause: Secondary | ICD-10-CM | POA: Insufficient documentation

## 2012-12-06 DIAGNOSIS — Z79899 Other long term (current) drug therapy: Secondary | ICD-10-CM | POA: Insufficient documentation

## 2012-12-06 DIAGNOSIS — J449 Chronic obstructive pulmonary disease, unspecified: Secondary | ICD-10-CM | POA: Insufficient documentation

## 2012-12-06 DIAGNOSIS — S0093XA Contusion of unspecified part of head, initial encounter: Secondary | ICD-10-CM

## 2012-12-06 DIAGNOSIS — Y939 Activity, unspecified: Secondary | ICD-10-CM | POA: Insufficient documentation

## 2012-12-06 DIAGNOSIS — W19XXXA Unspecified fall, initial encounter: Secondary | ICD-10-CM | POA: Insufficient documentation

## 2012-12-06 DIAGNOSIS — K219 Gastro-esophageal reflux disease without esophagitis: Secondary | ICD-10-CM | POA: Insufficient documentation

## 2012-12-06 DIAGNOSIS — G309 Alzheimer's disease, unspecified: Secondary | ICD-10-CM | POA: Insufficient documentation

## 2012-12-06 DIAGNOSIS — F0280 Dementia in other diseases classified elsewhere without behavioral disturbance: Secondary | ICD-10-CM | POA: Insufficient documentation

## 2012-12-06 DIAGNOSIS — I10 Essential (primary) hypertension: Secondary | ICD-10-CM | POA: Insufficient documentation

## 2012-12-06 DIAGNOSIS — F411 Generalized anxiety disorder: Secondary | ICD-10-CM | POA: Insufficient documentation

## 2012-12-06 DIAGNOSIS — S0003XA Contusion of scalp, initial encounter: Secondary | ICD-10-CM | POA: Insufficient documentation

## 2012-12-06 DIAGNOSIS — J4489 Other specified chronic obstructive pulmonary disease: Secondary | ICD-10-CM | POA: Insufficient documentation

## 2012-12-06 NOTE — ED Notes (Signed)
Fall hematoma to head skin tears on left wrist

## 2012-12-06 NOTE — ED Notes (Signed)
Patient presents to ed via [facility] states she was going to the bathroom and fell. States patient normally walks with a walker and wasn't using it. /co headache patient has a small hematoma to the back of her head with sm. Puncture wound. 2 small skin tears to left wrist. Patient is oriented to place only.

## 2012-12-06 NOTE — ED Notes (Signed)
Care transferred, report received Lew Dawes, RN.

## 2012-12-06 NOTE — ED Provider Notes (Signed)
History    CSN: [id number] Arrival date & time 12/06/12  0545  First MD Initiated Contact with Patient 12/06/12 [phone]     Chief Complaint  Patient presents with  . Fall   (Consider location/radiation/quality/duration/timing/severity/associated sxs/prior Treatment) HPI  [name] is a(n) 79 y.o. female who presents via ems from nursing facility for a fall. The level V caviat to do to dementia.  Patient apparently uses a walker but got up to use the restroom this morning without her walker falling and hitting her head.  She is a hematoma on the posterior occipitoparietal region.  The patient complains of some pain in her neck without midline tenderness on exam.  The patient is unable to provide any further history.  Past Medical History  Diagnosis Date  . GERD (gastroesophageal reflux disease)   . Anxiety   . Dementia   . Alzheimer disease   . COPD (chronic obstructive pulmonary disease)   . Hypertension    History reviewed. No pertinent past surgical history. No family history on file. History  Substance Use Topics  . Smoking status: Never Smoker   . Smokeless tobacco: Not on file  . Alcohol Use: No   OB History   Grav Para Term Preterm Abortions TAB SAB Ect Mult Living                 Review of Systems  Unable to perform ROS   Allergies  Review of patient's allergies indicates no known allergies.  Home Medications   Current Outpatient Rx  Name  Route  Sig  Dispense  Refill  . acetaminophen (TYLENOL) 500 MG tablet   Oral   Take 500 mg by mouth every 4 (four) hours as needed. For pain         . ALPRAZolam (XANAX) 0.25 MG tablet   Oral   Take 0.25 mg by mouth every 12 (twelve) hours as needed for anxiety.          . ALPRAZolam (XANAX) 0.25 MG tablet   Oral   Take 0.25 mg by mouth daily.         Marland Kitchen aluminum & magnesium hydroxide (MAALOX) 225-200 MG/5ML suspension   Oral   Take 30 mLs by mouth 4 (four) times daily as needed for indigestion.          . bumetanide (BUMEX) 0.5 MG tablet   Oral   Take 0.5 mg by mouth every other day.         . citalopram (CELEXA) 10 MG tablet   Oral   Take 10 mg by mouth daily.         Marland Kitchen donepezil (ARICEPT) 10 MG tablet   Oral   Take 10 mg by mouth at bedtime.         Marland Kitchen lisinopril (PRINIVIL,ZESTRIL) 10 MG tablet   Oral   Take 10 mg by mouth daily.         . potassium chloride (K-DUR,KLOR-CON) 10 MEQ tablet   Oral   Take 10 mEq by mouth daily.         . risperiDONE (RISPERDAL) 0.25 MG tablet   Oral   Take 0.25 mg by mouth at bedtime.         Marland Kitchen guaiFENesin (ROBITUSSIN) 100 MG/5ML liquid   Oral   Take 200 mg by mouth every 6 (six) hours as needed for cough. For cough         . loperamide (LOPERAMIDE A-D) 2 MG tablet   Oral  Take 2 mg by mouth 4 (four) times daily as needed for diarrhea or loose stools.          . magnesium hydroxide (MILK OF MAGNESIA) 800 MG/5ML suspension   Oral   Take 30 mLs by mouth at bedtime as needed for constipation. For constipation.          BP 183/68  Pulse 63  Temp(Src) 98.1 F (36.7 C) (Oral)  SpO2 100% Physical Exam Physical Exam  Nursing note and vitals reviewed. Constitutional: She is oriented to person, place, and time. She appears well-developed and well-nourished. No distress.  HENT:  Head: Normocephalic, hematoma to the left occipitoparietal region of the head.  There is a 1 cm laceration not actively bleeding at this time.  She is tender to palpation in the region.  Eyes: Conjunctivae normal and EOM are normal. Pupils are equal, round, and reactive to light. No scleral icterus.  Neck: Supple.  No midline tenderness.  She is tender to palpation in the cervical paraspinal muscles. Cardiovascular: Normal rate, regular rhythm and normal heart sounds.  Exam reveals no gallop and no friction rub.   No murmur heard. Pulmonary/Chest: Effort normal and breath sounds normal. No respiratory distress.  Abdominal: Soft. Bowel  sounds are normal. She exhibits no distension and no mass. There is no tenderness. There is no guarding.  Neurological: She is alert and oriented to person, place, and time.  Skin: Skin is warm and dry. She is not diaphoretic.  abrasion with tissue avulsion approximately 1 cm on the left wrist.   ED Course  Procedures (including critical care time) Labs Reviewed - No data to display No results found. 1. Fall, initial encounter   2. Traumatic hematoma of head, initial encounter     MDM  7:52 AM Patient with fall and minor head trauma. No signs of trauma elsewhere. Negative CTs. Wound cleansed and dressed. The patient appears reasonably screened and/or stabilized for discharge and I doubt any other medical condition or other [facility] requiring further screening, evaluation, or treatment in the ED at this time prior to discharge.   Arthor Captain, PA-C 12/06/12 1524

## 2013-01-14 NOTE — ED Provider Notes (Addendum)
Medical screening examination/treatment/procedure(s) were conducted as a shared visit with non-physician practitioner(s) and myself.  I personally evaluated the patient during the encounter. Elderly woman with dementia s/p fall with scalp hematoma but NAICP on CT brain. Agree with plan for disposition.   Brandt Loosen, MD 01/14/13 1606  Brandt Loosen, MD 03/26/13 [phone]

## 2013-04-08 ENCOUNTER — Emergency Department (HOSPITAL_COMMUNITY): Payer: Medicare Other

## 2013-04-08 ENCOUNTER — Emergency Department (HOSPITAL_COMMUNITY)
Admission: EM | Admit: 2013-04-08 | Discharge: 2013-04-08 | Disposition: A | Discharge status: Home / Self Care | Payer: Medicare Other | Attending: Emergency Medicine | Admitting: Emergency Medicine

## 2013-04-08 DIAGNOSIS — F411 Generalized anxiety disorder: Secondary | ICD-10-CM | POA: Diagnosis not present

## 2013-04-08 DIAGNOSIS — I10 Essential (primary) hypertension: Secondary | ICD-10-CM | POA: Insufficient documentation

## 2013-04-08 DIAGNOSIS — R296 Repeated falls: Secondary | ICD-10-CM | POA: Insufficient documentation

## 2013-04-08 DIAGNOSIS — F0280 Dementia in other diseases classified elsewhere without behavioral disturbance: Secondary | ICD-10-CM | POA: Diagnosis not present

## 2013-04-08 DIAGNOSIS — S0990XA Unspecified injury of head, initial encounter: Secondary | ICD-10-CM | POA: Diagnosis present

## 2013-04-08 DIAGNOSIS — S0003XA Contusion of scalp, initial encounter: Secondary | ICD-10-CM | POA: Insufficient documentation

## 2013-04-08 DIAGNOSIS — Y9389 Activity, other specified: Secondary | ICD-10-CM | POA: Diagnosis not present

## 2013-04-08 DIAGNOSIS — J4489 Other specified chronic obstructive pulmonary disease: Secondary | ICD-10-CM | POA: Insufficient documentation

## 2013-04-08 DIAGNOSIS — J449 Chronic obstructive pulmonary disease, unspecified: Secondary | ICD-10-CM | POA: Diagnosis not present

## 2013-04-08 DIAGNOSIS — G309 Alzheimer's disease, unspecified: Secondary | ICD-10-CM | POA: Insufficient documentation

## 2013-04-08 DIAGNOSIS — Y9210 Unspecified residential institution as the place of occurrence of the external cause: Secondary | ICD-10-CM | POA: Diagnosis not present

## 2013-04-08 DIAGNOSIS — Z79899 Other long term (current) drug therapy: Secondary | ICD-10-CM | POA: Diagnosis not present

## 2013-04-08 DIAGNOSIS — Z8719 Personal history of other diseases of the digestive system: Secondary | ICD-10-CM | POA: Diagnosis not present

## 2013-04-08 DIAGNOSIS — W19XXXA Unspecified fall, initial encounter: Secondary | ICD-10-CM

## 2013-04-08 LAB — URINE MICROSCOPIC-ADD ON

## 2013-04-08 LAB — URINALYSIS, ROUTINE W REFLEX MICROSCOPIC
Glucose, UA: NEGATIVE mg/dL
Protein, ur: NEGATIVE mg/dL

## 2013-04-08 NOTE — ED Provider Notes (Signed)
CSN: [id number]     Arrival date & time 04/08/13  1733 History   First MD Initiated Contact with Patient 04/08/13 1737     Chief Complaint  Patient presents with  . Fall   (Consider location/radiation/quality/duration/timing/severity/associated sxs/prior Treatment) HPI  This a 42 are old female who presents from [facility] following a fall. This was a witnessed fall by staff. There was no report of syncope. Patient has dementia and is noncontributory to history taking. Noted to have a hematoma to her forehead. Patient was transported in a c-collar. Per EMS report, she is at her baseline. When asked if she hurts anywhere the patient states "my head." She is not on any anticoagulants. Past Medical History  Diagnosis Date  . GERD (gastroesophageal reflux disease)   . Anxiety   . Dementia   . Alzheimer disease   . COPD (chronic obstructive pulmonary disease)   . Hypertension    No past surgical history on file. No family history on file. History  Substance Use Topics  . Smoking status: Never Smoker   . Smokeless tobacco: Not on file  . Alcohol Use: No   OB History   Grav Para Term Preterm Abortions TAB SAB Ect Mult Living                 Review of Systems  Unable to perform ROS: Dementia   level V caveat applies  Allergies  Review of patient's allergies indicates no known allergies.  Home Medications   Current Outpatient Rx  Name  Route  Sig  Dispense  Refill  . acetaminophen (TYLENOL) 500 MG tablet   Oral   Take 500 mg by mouth every 4 (four) hours as needed. For pain         . ALPRAZolam (XANAX) 0.25 MG tablet   Oral   Take 0.25 mg by mouth every 12 (twelve) hours as needed for anxiety.          Marland Kitchen aluminum & magnesium hydroxide (MAALOX) 225-200 MG/5ML suspension   Oral   Take 30 mLs by mouth 4 (four) times daily as needed for indigestion.         . bumetanide (BUMEX) 0.5 MG tablet   Oral   Take 0.5 mg by mouth every other day.         .  citalopram (CELEXA) 10 MG tablet   Oral   Take 10 mg by mouth daily.         Marland Kitchen guaiFENesin (ROBITUSSIN) 100 MG/5ML liquid   Oral   Take 200 mg by mouth every 6 (six) hours as needed for cough. For cough         . lisinopril (PRINIVIL,ZESTRIL) 10 MG tablet   Oral   Take 10 mg by mouth daily.         Marland Kitchen loperamide (LOPERAMIDE A-D) 2 MG tablet   Oral   Take 2 mg by mouth 4 (four) times daily as needed for diarrhea or loose stools.          . magnesium hydroxide (MILK OF MAGNESIA) 800 MG/5ML suspension   Oral   Take 30 mLs by mouth at bedtime as needed for constipation. For constipation.         . potassium chloride (K-DUR,KLOR-CON) 10 MEQ tablet   Oral   Take 10 mEq by mouth daily.         . risperiDONE (RISPERDAL) 0.25 MG tablet   Oral   Take 0.25 mg by mouth at bedtime.  BP 162/80  Pulse 84  Temp(Src) 97.6 F (36.4 C) (Oral)  Resp 18  SpO2 100% Physical Exam  Nursing note and vitals reviewed. Constitutional:  Elderly, no acute distress  HENT:  Right Ear: External ear normal.  Left Ear: External ear normal.  Large 8 cm hematoma over the right for head without laceration or abrasion.  Mucous membranes dry  Eyes: EOM are normal. Pupils are equal, round, and reactive to light.  Neck: Neck supple.  Cardiovascular: Normal rate, regular rhythm and normal heart sounds.   No murmur heard. Pulmonary/Chest: Effort normal and breath sounds normal. No respiratory distress.  Abdominal: Soft. Bowel sounds are normal.  Musculoskeletal:  Tenderness to palpation over the right greater trochanter without limited range of motion  Neurological: She is alert.  Ms. all 4 extremities, no focal deficit noted  Skin: Skin is warm and dry.  Psychiatric: She has a normal mood and affect.    ED Course  Procedures (including critical care time) Labs Review Labs Reviewed  URINALYSIS, ROUTINE W REFLEX MICROSCOPIC - Abnormal; Notable for the following:    Hgb urine  dipstick TRACE (*)    Leukocytes, UA SMALL (*)    All other components within normal limits  URINE MICROSCOPIC-ADD ON - Abnormal; Notable for the following:    Bacteria, UA FEW (*)    All other components within normal limits  URINE CULTURE   Imaging Review Dg Hip Complete Right  04/08/2013   CLINICAL DATA:  Larey Seat.  Right hip pain.  EXAM: RIGHT HIP - COMPLETE 2+ VIEW  COMPARISON:  None.  FINDINGS: The right hip is normally located. No significant degenerative changes for age and no acute fracture. No findings for avascular necrosis. A left hip prosthesis is noted. The pubic symphysis and SI joints are intact. No definite pelvic fractures.  IMPRESSION: No acute bony findings.   Electronically Signed   By: Loralie Champagne M.D.   On: 04/08/2013 18:23   Ct Head Wo Contrast  04/08/2013   CLINICAL DATA:  Fall.  Anxiety.  Dementia.  Alzheimer's disease.  EXAM: CT HEAD WITHOUT CONTRAST  CT CERVICAL SPINE WITHOUT CONTRAST  TECHNIQUE: Multidetector CT imaging of the head and cervical spine was performed following the standard protocol without intravenous contrast. Multiplanar CT image reconstructions of the cervical spine were also generated.  COMPARISON:  12/06/2012  FINDINGS: CT HEAD FINDINGS  The brainstem, cerebellum, cerebral peduncles, thalamus, basal ganglia, basilar cisterns, and ventricular system appear within normal limits. No intracranial hemorrhage, mass lesion, or acute CVA. Periventricular white matter and corona radiata hypodensities favor chronic ischemic microvascular white matter disease. Right forehead scalp hematoma observed.  CT CERVICAL SPINE FINDINGS  Multilevel subluxations in the cervical spine are similar to prior and included 2 mm posterior subluxation at C3-4, 2 mm anterior subluxation at C5-6, and 1.5 mm anterior subluxation at C6-7, with loss of disc height at C3-4 and C6-7 as before. There is associated degenerative facet arthropathy. No new fracture or new subluxation. No  prevertebral soft tissue swelling.  Biapical pleural parenchymal scarring is symmetric and has associated calcifications.  IMPRESSION: 1. No acute intracranial findings. Periventricular white matter and corona radiata hypodensities favor chronic ischemic microvascular white matter disease. 2. Right forehead scalp hematoma. 3. Stable degenerative cervical spine subluxations, without fracture, acute subluxation, or acute cervical spine findings. 4. Biapical pleural parenchymal scarring in the lungs.   Electronically Signed   By: Herbie Baltimore M.D.   On: 04/08/2013 18:24   Ct Cervical Spine  Wo Contrast  04/08/2013   CLINICAL DATA:  Fall.  Anxiety.  Dementia.  Alzheimer's disease.  EXAM: CT HEAD WITHOUT CONTRAST  CT CERVICAL SPINE WITHOUT CONTRAST  TECHNIQUE: Multidetector CT imaging of the head and cervical spine was performed following the standard protocol without intravenous contrast. Multiplanar CT image reconstructions of the cervical spine were also generated.  COMPARISON:  12/06/2012  FINDINGS: CT HEAD FINDINGS  The brainstem, cerebellum, cerebral peduncles, thalamus, basal ganglia, basilar cisterns, and ventricular system appear within normal limits. No intracranial hemorrhage, mass lesion, or acute CVA. Periventricular white matter and corona radiata hypodensities favor chronic ischemic microvascular white matter disease. Right forehead scalp hematoma observed.  CT CERVICAL SPINE FINDINGS  Multilevel subluxations in the cervical spine are similar to prior and included 2 mm posterior subluxation at C3-4, 2 mm anterior subluxation at C5-6, and 1.5 mm anterior subluxation at C6-7, with loss of disc height at C3-4 and C6-7 as before. There is associated degenerative facet arthropathy. No new fracture or new subluxation. No prevertebral soft tissue swelling.  Biapical pleural parenchymal scarring is symmetric and has associated calcifications.  IMPRESSION: 1. No acute intracranial findings. Periventricular  white matter and corona radiata hypodensities favor chronic ischemic microvascular white matter disease. 2. Right forehead scalp hematoma. 3. Stable degenerative cervical spine subluxations, without fracture, acute subluxation, or acute cervical spine findings. 4. Biapical pleural parenchymal scarring in the lungs.   Electronically Signed   By: Herbie Baltimore M.D.   On: 04/08/2013 18:24    EKG Interpretation   None      EKG independently reviewed by myself:  EKG shows sinus rhythm with a rate of 67, no interval prolongation or arrhythmia, PACs noted MDM   1. Fall at nursing home, initial encounter   2. Hematoma of frontal scalp, initial encounter    Patient presents from a nursing home following a fall.  She is reportedly at her baseline. She has full range of motion of the bilateral lower extremities. Hematoma noted to the right frontal scalp. CT scan of the head and neck negative for acute injury. Patient was able to ambulate without difficulty. Has no other evidence of trauma.  Feel the patient has been medically screened and is stable for discharge. She will be sent back to her living facility.  After history, exam, and medical workup I feel the patient has been appropriately medically screened and is safe for discharge home. Pertinent diagnoses were discussed with the patient. Patient was given return precautions.     Shon Baton, MD 04/09/13 [phone]

## 2013-04-08 NOTE — ED Notes (Signed)
[facility] called for transportation to [facility]

## 2013-04-08 NOTE — ED Notes (Signed)
To ED from [facility] for witnessed fall, hematoma to forehead, VSS, C-collar in place, is at her neuro baseline, family was contacted, NAD

## 2013-04-08 NOTE — ED Notes (Signed)
Dr Wilkie Aye aware of elevated bp will continue to monitor

## 2013-04-08 NOTE — ED Notes (Signed)
Pt ambulated to bathroom with assistance x1 tolerated hr 75-80 no sob or doe denies pain pox 95%

## 2013-04-09 LAB — URINE CULTURE

## 2013-04-25 ENCOUNTER — Encounter (HOSPITAL_COMMUNITY): Payer: Self-pay | Admitting: Emergency Medicine

## 2013-04-25 ENCOUNTER — Emergency Department (HOSPITAL_COMMUNITY): Payer: Medicare Other

## 2013-04-25 ENCOUNTER — Emergency Department (HOSPITAL_COMMUNITY)
Admission: EM | Admit: 2013-04-25 | Discharge: 2013-04-25 | Disposition: A | Discharge status: Home / Self Care | Payer: Medicare Other | Attending: Emergency Medicine | Admitting: Emergency Medicine

## 2013-04-25 DIAGNOSIS — M25559 Pain in unspecified hip: Secondary | ICD-10-CM | POA: Insufficient documentation

## 2013-04-25 DIAGNOSIS — F411 Generalized anxiety disorder: Secondary | ICD-10-CM | POA: Insufficient documentation

## 2013-04-25 DIAGNOSIS — J449 Chronic obstructive pulmonary disease, unspecified: Secondary | ICD-10-CM | POA: Insufficient documentation

## 2013-04-25 DIAGNOSIS — Y9210 Unspecified residential institution as the place of occurrence of the external cause: Secondary | ICD-10-CM | POA: Insufficient documentation

## 2013-04-25 DIAGNOSIS — K219 Gastro-esophageal reflux disease without esophagitis: Secondary | ICD-10-CM | POA: Insufficient documentation

## 2013-04-25 DIAGNOSIS — Y939 Activity, unspecified: Secondary | ICD-10-CM | POA: Insufficient documentation

## 2013-04-25 DIAGNOSIS — Z79899 Other long term (current) drug therapy: Secondary | ICD-10-CM | POA: Insufficient documentation

## 2013-04-25 DIAGNOSIS — I129 Hypertensive chronic kidney disease with stage 1 through stage 4 chronic kidney disease, or unspecified chronic kidney disease: Secondary | ICD-10-CM | POA: Insufficient documentation

## 2013-04-25 DIAGNOSIS — G309 Alzheimer's disease, unspecified: Secondary | ICD-10-CM | POA: Insufficient documentation

## 2013-04-25 DIAGNOSIS — W19XXXA Unspecified fall, initial encounter: Secondary | ICD-10-CM | POA: Insufficient documentation

## 2013-04-25 DIAGNOSIS — N289 Disorder of kidney and ureter, unspecified: Secondary | ICD-10-CM | POA: Insufficient documentation

## 2013-04-25 DIAGNOSIS — F0280 Dementia in other diseases classified elsewhere without behavioral disturbance: Secondary | ICD-10-CM | POA: Insufficient documentation

## 2013-04-25 DIAGNOSIS — J4489 Other specified chronic obstructive pulmonary disease: Secondary | ICD-10-CM | POA: Insufficient documentation

## 2013-04-25 HISTORY — DX: Disorder of kidney and ureter, unspecified: N28.9

## 2013-04-25 NOTE — ED Notes (Signed)
Pt clothes put back on patient. Calling [facility] and facility for patient transport back to facility.
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
## 2013-04-25 NOTE — ED Notes (Signed)
Patient transported to X-ray 

## 2013-04-25 NOTE — ED Notes (Signed)
[facility] arrived to pick patient up.

## 2013-04-25 NOTE — ED Notes (Signed)
Pt has some bruising around right eye that appears to be older. Pt states she fell but does not remember hitting her eye.
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
## 2013-04-25 NOTE — ED Provider Notes (Signed)
Medical screening examination/treatment/procedure(s) were conducted as a shared visit with non-physician practitioner(s) and myself.  I personally evaluated the patient during the encounter.  EKG Interpretation   None        Pt seen and evaluated, she is s/p unwitnessed fall at nursing home.  She appears to have some old bruising around right eye.  Pt discharged after cleared by xray/ct.  Discharged with strict return precautions.  Pt agreeable with plan.  Ethelda Chick, MD 04/25/13 [phone]

## 2013-04-25 NOTE — ED Notes (Addendum)
Report called to [facility] house. Pt is dressed in the clothes she came in and is stable- ready to go. [facility] has been called by the Diplomatic Services operational officer.

## 2013-07-18 ENCOUNTER — Emergency Department (HOSPITAL_COMMUNITY)
Admission: EM | Admit: 2013-07-18 | Discharge: 2013-07-19 | Disposition: A | Discharge status: Home / Self Care | Payer: Medicare Other | Attending: Emergency Medicine | Admitting: Emergency Medicine

## 2013-07-18 ENCOUNTER — Encounter (HOSPITAL_COMMUNITY): Payer: Self-pay | Admitting: Emergency Medicine

## 2013-07-18 DIAGNOSIS — S0180XA Unspecified open wound of other part of head, initial encounter: Secondary | ICD-10-CM | POA: Insufficient documentation

## 2013-07-18 DIAGNOSIS — F411 Generalized anxiety disorder: Secondary | ICD-10-CM | POA: Insufficient documentation

## 2013-07-18 DIAGNOSIS — J4489 Other specified chronic obstructive pulmonary disease: Secondary | ICD-10-CM | POA: Insufficient documentation

## 2013-07-18 DIAGNOSIS — W19XXXA Unspecified fall, initial encounter: Secondary | ICD-10-CM

## 2013-07-18 DIAGNOSIS — K219 Gastro-esophageal reflux disease without esophagitis: Secondary | ICD-10-CM | POA: Insufficient documentation

## 2013-07-18 DIAGNOSIS — Z87448 Personal history of other diseases of urinary system: Secondary | ICD-10-CM | POA: Insufficient documentation

## 2013-07-18 DIAGNOSIS — Y9210 Unspecified residential institution as the place of occurrence of the external cause: Secondary | ICD-10-CM | POA: Insufficient documentation

## 2013-07-18 DIAGNOSIS — G309 Alzheimer's disease, unspecified: Secondary | ICD-10-CM | POA: Insufficient documentation

## 2013-07-18 DIAGNOSIS — I10 Essential (primary) hypertension: Secondary | ICD-10-CM | POA: Insufficient documentation

## 2013-07-18 DIAGNOSIS — Y939 Activity, unspecified: Secondary | ICD-10-CM | POA: Insufficient documentation

## 2013-07-18 DIAGNOSIS — F0280 Dementia in other diseases classified elsewhere without behavioral disturbance: Secondary | ICD-10-CM | POA: Insufficient documentation

## 2013-07-18 DIAGNOSIS — S0181XA Laceration without foreign body of other part of head, initial encounter: Secondary | ICD-10-CM

## 2013-07-18 DIAGNOSIS — J449 Chronic obstructive pulmonary disease, unspecified: Secondary | ICD-10-CM | POA: Insufficient documentation

## 2013-07-18 DIAGNOSIS — Z79899 Other long term (current) drug therapy: Secondary | ICD-10-CM | POA: Insufficient documentation

## 2013-07-18 DIAGNOSIS — W06XXXA Fall from bed, initial encounter: Secondary | ICD-10-CM | POA: Insufficient documentation

## 2013-07-18 NOTE — ED Notes (Addendum)
Pt found beside bed tonight. Bed was very low. Only complaint lac on R forehead. Alzheimer's pt. Alert and oriented per norm. No blood thinners.

## 2013-07-19 ENCOUNTER — Emergency Department (HOSPITAL_COMMUNITY): Payer: Medicare Other

## 2013-07-19 LAB — BASIC METABOLIC PANEL
BUN: 19 mg/dL (ref 6–23)
CALCIUM: 9.3 mg/dL (ref 8.4–10.5)
CO2: 25 mEq/L (ref 19–32)
CREATININE: 1.07 mg/dL (ref 0.50–1.10)
Chloride: 106 mEq/L (ref 96–112)
GFR calc Af Amer: 50 mL/min — ABNORMAL LOW (ref >90)
GFR calc non Af Amer: 43 mL/min — ABNORMAL LOW (ref >90)
Glucose, Bld: 93 mg/dL (ref 70–99)
Potassium: 4.4 mEq/L (ref 3.7–5.3)
SODIUM: 142 meq/L (ref 137–147)

## 2013-07-19 NOTE — ED Notes (Signed)
[facility] called  

## 2013-07-19 NOTE — ED Provider Notes (Signed)
Medical screening examination/treatment/procedure(s) were performed by non-physician practitioner and as supervising physician I was immediately available for consultation/collaboration.    Celene KrasJon R Lusero Nordlund, MD 07/19/13 [phone]

## 2013-07-19 NOTE — Discharge Instructions (Signed)
Sutured Wound Care °Sutures are stitches that can be used to close wounds. Wound care helps prevent pain and infection.  °HOME CARE INSTRUCTIONS  °· Rest and elevate the injured area until all the pain and swelling are gone. °· Only take over-the-counter or prescription medicines for pain, discomfort, or fever as directed by your caregiver. °· After 48 hours, gently wash the area with mild soap and water once a day, or as directed. Rinse off the soap. Pat the area dry with a clean towel. Do not rub the wound. This may cause bleeding. °· Follow your caregiver's instructions for how often to change the bandage (dressing). Stop using a dressing after 2 days or after the wound stops draining. °· If the dressing sticks, moisten it with soapy water and gently remove it. °· Apply ointment on the wound as directed. °· Avoid stretching a sutured wound. °· Drink enough fluids to keep your urine clear or pale yellow. °· Follow up with your caregiver for suture removal as directed. °· Use sunscreen on your wound for the next 3 to 6 months so the scar will not darken. °SEEK IMMEDIATE MEDICAL CARE IF:  °· Your wound becomes red, swollen, hot, or tender. °· You have increasing pain in the wound. °· You have a red streak that extends from the wound. °· There is pus coming from the wound. °· You have a fever. °· You have shaking chills. °· There is a bad smell coming from the wound. °· You have persistent bleeding from the wound. °MAKE SURE YOU:  °· Understand these instructions. °· Will watch your condition. °· Will get help right away if you are not doing well or get worse. °Document Released: 06/13/2004 Document Revised: 07/29/2011 Document Reviewed: 09/09/2010 °[employer]® Patient Information ©2014 [employer]. ° °

## 2013-07-19 NOTE — ED Provider Notes (Signed)
CSN: [id number]     Arrival date & time 07/18/13  2328 History   First MD Initiated Contact with Patient 07/18/13 2333     Chief Complaint  Patient presents with  . Fall  . Head Laceration     (Consider location/radiation/quality/duration/timing/severity/associated sxs/prior Treatment) Patient is a 80 y.o. female presenting with fall and scalp laceration. The history is provided by the patient and the EMS personnel. No language interpreter was used.  Fall Associated symptoms comments: The patient arrives via EMS with report she was found beside her bed on the floor in unwitnessed fall that caused facial laceration. The patient has no complaint of pain. There is no report of vomiting or other injury. She has a history of dementia and does not remember the fall. .  Head Laceration    Past Medical History  Diagnosis Date  . GERD (gastroesophageal reflux disease)   . Anxiety   . Dementia   . Alzheimer disease   . COPD (chronic obstructive pulmonary disease)   . Hypertension   . Renal disorder    History reviewed. No pertinent past surgical history. History reviewed. No pertinent family history. History  Substance Use Topics  . Smoking status: Never Smoker   . Smokeless tobacco: Not on file  . Alcohol Use: No   OB History   Grav Para Term Preterm Abortions TAB SAB Ect Mult Living                 Review of Systems  Unable to perform ROS     Allergies  Review of patient's allergies indicates no known allergies.  Home Medications   Current Outpatient Rx  Name  Route  Sig  Dispense  Refill  . acetaminophen (TYLENOL) 500 MG tablet   Oral   Take 500 mg by mouth 3 (three) times daily. For pain         . ALPRAZolam (XANAX) 0.5 MG tablet   Oral   Take 0.5 mg by mouth every 4 (four) hours as needed for anxiety.         Marland Kitchen. aluminum & magnesium hydroxide (MAALOX) 225-200 MG/5ML suspension   Oral   Take 30 mLs by mouth 4 (four) times daily as needed for indigestion.          . bumetanide (BUMEX) 0.5 MG tablet   Oral   Take 0.5 mg by mouth 2 (two) times a week. On Monday and Thursday         . citalopram (CELEXA) 10 MG tablet   Oral   Take 10 mg by mouth daily.         Marland Kitchen. donepezil (ARICEPT) 10 MG tablet   Oral   Take 10 mg by mouth at bedtime.         Marland Kitchen. guaiFENesin (ROBITUSSIN) 100 MG/5ML liquid   Oral   Take 200 mg by mouth every 6 (six) hours as needed for cough. For cough         . lisinopril (PRINIVIL,ZESTRIL) 10 MG tablet   Oral   Take 10 mg by mouth daily.         Marland Kitchen. loperamide (LOPERAMIDE A-D) 2 MG tablet   Oral   Take 2 mg by mouth 4 (four) times daily as needed for diarrhea or loose stools.          . magnesium hydroxide (MILK OF MAGNESIA) 800 MG/5ML suspension   Oral   Take 30 mLs by mouth at bedtime as needed for constipation. For constipation.         .Marland Kitchen  potassium chloride (K-DUR,KLOR-CON) 10 MEQ tablet   Oral   Take 10 mEq by mouth daily.         . risperiDONE (RISPERDAL) 0.25 MG tablet   Oral   Take 0.25 mg by mouth at bedtime.          BP 158/59  Pulse 72  Temp(Src) 98.2 F (36.8 C) (Oral)  Resp 20  SpO2 100% Physical Exam  Constitutional: She appears well-developed and well-nourished. No distress.  Awake, alert, pleasant.  HENT:  Head: Normocephalic.  2 cm laceration left lateral eye brow.  Neck: Normal range of motion.  Pulmonary/Chest: Effort normal. She exhibits no tenderness.  Abdominal: Soft. There is no tenderness.  Musculoskeletal:  No midline or paracervical tenderness. She is moving head and neck in full range of motion completely pain free.   Neurological: She is alert. Coordination normal.  Skin: Skin is warm and dry.  Psychiatric: She has a normal mood and affect.    ED Course  Procedures (including critical care time) Labs Review Labs Reviewed  BASIC METABOLIC PANEL   Imaging Review No results found.   EKG Interpretation None      MDM   Final diagnoses:  None     1. Fall 2. Facial laceration  LACERATION REPAIR Performed by: Elpidio Anis A Authorized by: Elpidio Anis A Consent: Verbal consent obtained. Risks and benefits: risks, benefits and alternatives were discussed Consent given by: patient Patient identity confirmed: provided demographic data Prepped and Draped in normal sterile fashion Wound explored  Laceration Location: left eye brow  Laceration Length: 2cm  No Foreign Bodies seen or palpated  Anesthesia: local infiltration  Local anesthetic: lidocaine 2% w/ epinephrine  Anesthetic total: 1 ml  Irrigation method: syringe Amount of cleaning: standard  Skin closure: 6-0prolene  Number of sutures: 3  Technique: simple interrupted  Patient tolerance: Patient tolerated the procedure well with no immediate complications.     Arnoldo Hooker, PA-C 07/19/13 [phone]

## 2013-10-17 ENCOUNTER — Emergency Department (HOSPITAL_COMMUNITY)
Admission: EM | Admit: 2013-10-17 | Discharge: 2013-10-17 | Disposition: A | Discharge status: Home / Self Care | Payer: Medicare Other | Attending: Emergency Medicine | Admitting: Emergency Medicine

## 2013-10-17 ENCOUNTER — Encounter (HOSPITAL_COMMUNITY): Payer: Self-pay | Admitting: Emergency Medicine

## 2013-10-17 DIAGNOSIS — S59909A Unspecified injury of unspecified elbow, initial encounter: Secondary | ICD-10-CM | POA: Diagnosis present

## 2013-10-17 DIAGNOSIS — F411 Generalized anxiety disorder: Secondary | ICD-10-CM | POA: Diagnosis not present

## 2013-10-17 DIAGNOSIS — Y9389 Activity, other specified: Secondary | ICD-10-CM | POA: Insufficient documentation

## 2013-10-17 DIAGNOSIS — F0280 Dementia in other diseases classified elsewhere without behavioral disturbance: Secondary | ICD-10-CM | POA: Diagnosis not present

## 2013-10-17 DIAGNOSIS — S5010XA Contusion of unspecified forearm, initial encounter: Secondary | ICD-10-CM | POA: Insufficient documentation

## 2013-10-17 DIAGNOSIS — Z87448 Personal history of other diseases of urinary system: Secondary | ICD-10-CM | POA: Diagnosis not present

## 2013-10-17 DIAGNOSIS — J449 Chronic obstructive pulmonary disease, unspecified: Secondary | ICD-10-CM | POA: Insufficient documentation

## 2013-10-17 DIAGNOSIS — Y9210 Unspecified residential institution as the place of occurrence of the external cause: Secondary | ICD-10-CM | POA: Insufficient documentation

## 2013-10-17 DIAGNOSIS — I10 Essential (primary) hypertension: Secondary | ICD-10-CM | POA: Diagnosis not present

## 2013-10-17 DIAGNOSIS — IMO0002 Reserved for concepts with insufficient information to code with codable children: Secondary | ICD-10-CM | POA: Diagnosis not present

## 2013-10-17 DIAGNOSIS — K219 Gastro-esophageal reflux disease without esophagitis: Secondary | ICD-10-CM | POA: Diagnosis not present

## 2013-10-17 DIAGNOSIS — S5000XA Contusion of unspecified elbow, initial encounter: Secondary | ICD-10-CM | POA: Diagnosis not present

## 2013-10-17 DIAGNOSIS — G309 Alzheimer's disease, unspecified: Secondary | ICD-10-CM | POA: Diagnosis not present

## 2013-10-17 DIAGNOSIS — Z79899 Other long term (current) drug therapy: Secondary | ICD-10-CM | POA: Diagnosis not present

## 2013-10-17 DIAGNOSIS — Y92129 Unspecified place in nursing home as the place of occurrence of the external cause: Secondary | ICD-10-CM

## 2013-10-17 DIAGNOSIS — S5011XA Contusion of right forearm, initial encounter: Secondary | ICD-10-CM

## 2013-10-17 DIAGNOSIS — W1809XA Striking against other object with subsequent fall, initial encounter: Secondary | ICD-10-CM | POA: Insufficient documentation

## 2013-10-17 DIAGNOSIS — J4489 Other specified chronic obstructive pulmonary disease: Secondary | ICD-10-CM | POA: Insufficient documentation

## 2013-10-17 DIAGNOSIS — W19XXXA Unspecified fall, initial encounter: Secondary | ICD-10-CM

## 2013-10-17 NOTE — ED Notes (Signed)
Pt had fall this morning c/o of right upper arm and forearm reddness to area. Also hit head on right side. Denies LOC.

## 2013-10-17 NOTE — Discharge Instructions (Signed)

## 2013-10-17 NOTE — ED Provider Notes (Addendum)
CSN: [id number]     Arrival date & time 10/17/13  0730 History   First MD Initiated Contact with Patient 10/17/13 [phone]     Chief Complaint  Patient presents with  . Fall     (Consider location/radiation/quality/duration/timing/severity/associated sxs/prior Treatment) Patient is a 80 y.o. female presenting with fall. The history is provided by the patient, the nursing home and the EMS personnel. The history is limited by the condition of the patient and the absence of a caregiver (severe dementia).  Fall This is a recurrent (fell at nursing home.  fall was witnessed without LOC.  states she hit the right side of her head and right elbow and sent here for evaluation) problem. The current episode started less than 1 hour ago. The problem has not changed since onset.Associated symptoms comments: Pt has no complaints.  Per EMS pt at baseline and hit her head when she fell without LOC.  . Nothing aggravates the symptoms. Nothing relieves the symptoms. She has tried nothing for the symptoms.    Past Medical History  Diagnosis Date  . GERD (gastroesophageal reflux disease)   . Anxiety   . Dementia   . Alzheimer disease   . COPD (chronic obstructive pulmonary disease)   . Hypertension   . Renal disorder    History reviewed. No pertinent past surgical history. No family history on file. History  Substance Use Topics  . Smoking status: Never Smoker   . Smokeless tobacco: Not on file  . Alcohol Use: No   OB History   Grav Para Term Preterm Abortions TAB SAB Ect Mult Living                 Review of Systems  Unable to perform ROS     Allergies  Review of patient's allergies indicates no known allergies.  Home Medications   Prior to Admission medications   Medication Sig Start Date End Date Taking? Authorizing Provider  acetaminophen (TYLENOL) 500 MG tablet Take 500 mg by mouth 3 (three) times daily. For pain   Yes Historical Provider, MD  ALPRAZolam (XANAX) 0.5 MG tablet Take  0.25 mg by mouth every 4 (four) hours as needed for anxiety.    Yes Historical Provider, MD  bumetanide (BUMEX) 0.5 MG tablet Take 0.5 mg by mouth 2 (two) times a week. On Monday and Thursday   Yes Historical Provider, MD  citalopram (CELEXA) 10 MG tablet Take 10 mg by mouth daily.   Yes Historical Provider, MD  donepezil (ARICEPT) 10 MG tablet Take 10 mg by mouth at bedtime.   Yes Historical Provider, MD  lisinopril (PRINIVIL,ZESTRIL) 10 MG tablet Take 10 mg by mouth daily.   Yes Historical Provider, MD  potassium chloride (K-DUR,KLOR-CON) 10 MEQ tablet Take 10 mEq by mouth daily.   Yes Historical Provider, MD  risperiDONE (RISPERDAL) 0.25 MG tablet Take 0.25 mg by mouth at bedtime. 09/02/12  Yes Hurman Horn, MD  aluminum & magnesium hydroxide (MAALOX) 225-200 MG/5ML suspension Take 30 mLs by mouth 4 (four) times daily as needed for indigestion.    Historical Provider, MD  guaiFENesin (ROBITUSSIN) 100 MG/5ML liquid Take 200 mg by mouth every 6 (six) hours as needed for cough. For cough    Historical Provider, MD  loperamide (LOPERAMIDE A-D) 2 MG tablet Take 2 mg by mouth 4 (four) times daily as needed for diarrhea or loose stools.     Historical Provider, MD  magnesium hydroxide (MILK OF MAGNESIA) 800 MG/5ML suspension Take 30 mLs  by mouth at bedtime as needed for constipation. For constipation.    Historical Provider, MD   BP 160/62  Pulse 92  Temp(Src) 98.9 F (37.2 C) (Oral)  Resp 20  SpO2 99% Physical Exam  Nursing note and vitals reviewed. Constitutional: She appears well-developed and well-nourished. No distress.  HENT:  Head: Normocephalic and atraumatic.  Mouth/Throat: Oropharynx is clear and moist.  No ecchymosis or swelling to the face or head and no c-spine tenderness to palpation.  Eyes: Conjunctivae and EOM are normal. Pupils are equal, round, and reactive to light.  Neck: Normal range of motion. Neck supple.  Cardiovascular: Normal rate, regular rhythm and intact distal  pulses.   No murmur heard. Pulmonary/Chest: Effort normal and breath sounds normal. No respiratory distress. She has no wheezes. She has no rales.  Abdominal: Soft. She exhibits no distension. There is no tenderness. There is no rebound and no guarding.  Musculoskeletal: Normal range of motion. She exhibits no edema and no tenderness.  Small bruise over the right elbow but pt able to range without difficulty and puts weight on the arm without pain.  Bilateral hips ranged without pain.    Neurological: She is alert.  Pt is awake and oriented to self.  She is slightly agitated trying to climb out of bed and "go home"  Skin: Skin is warm and dry. No rash noted. No erythema.    ED Course  Procedures (including critical care time) Labs Review Labs Reviewed - No data to display  Imaging Review No results found.   EKG Interpretation None      MDM   Final diagnoses:  Fall at nursing home  Contusion of right elbow and forearm   Patient with witnessed fall at the nursing home today and brought him to evaluation. She is not taking anticoagulation and it was reported that she hit her right elbow and distal right side of her head without LOC. Patient is awake and alert but displaced to signs of dementia and is only oriented to self. She currently is attempting to crawl out of the bed and states she wants to go home. Per EMS this is patient's baseline. Patient is able to put weight on her right arm and range without difficulty.  External signs of head trauma and patient not on anticoagulation and had no LOC and at this time do not feel that patient needs a CT of her head her neck as she has no pain and is neurovascularly intact.  After evaluation did not feel that the patient warrants any imaging at this time and can be safely discharged home.    Gwyneth SproutWhitney Caprice Wasko, MD 10/17/13 [id number]  Gwyneth SproutWhitney Kathyjo Briere, MD 10/17/13 [phone]

## 2013-10-17 NOTE — ED Notes (Signed)
Bed: TX52 Expected date: 10/17/13 Expected time: 6:56 AM Means of arrival: Ambulance Comments: Fall, redness to head and arm

## 2014-03-09 ENCOUNTER — Encounter (HOSPITAL_COMMUNITY): Payer: Self-pay | Admitting: Emergency Medicine

## 2014-03-09 ENCOUNTER — Emergency Department (HOSPITAL_COMMUNITY)
Admission: EM | Admit: 2014-03-09 | Discharge: 2014-03-09 | Disposition: A | Discharge status: Home / Self Care | Payer: Medicare Other | Attending: Emergency Medicine | Admitting: Emergency Medicine

## 2014-03-09 ENCOUNTER — Emergency Department (HOSPITAL_COMMUNITY): Payer: Medicare Other

## 2014-03-09 DIAGNOSIS — F0280 Dementia in other diseases classified elsewhere without behavioral disturbance: Secondary | ICD-10-CM | POA: Diagnosis not present

## 2014-03-09 DIAGNOSIS — F419 Anxiety disorder, unspecified: Secondary | ICD-10-CM | POA: Insufficient documentation

## 2014-03-09 DIAGNOSIS — I10 Essential (primary) hypertension: Secondary | ICD-10-CM | POA: Insufficient documentation

## 2014-03-09 DIAGNOSIS — J449 Chronic obstructive pulmonary disease, unspecified: Secondary | ICD-10-CM | POA: Insufficient documentation

## 2014-03-09 DIAGNOSIS — G309 Alzheimer's disease, unspecified: Secondary | ICD-10-CM | POA: Diagnosis not present

## 2014-03-09 DIAGNOSIS — R109 Unspecified abdominal pain: Secondary | ICD-10-CM | POA: Diagnosis present

## 2014-03-09 DIAGNOSIS — Z79899 Other long term (current) drug therapy: Secondary | ICD-10-CM | POA: Diagnosis not present

## 2014-03-09 DIAGNOSIS — N390 Urinary tract infection, site not specified: Secondary | ICD-10-CM | POA: Diagnosis not present

## 2014-03-09 DIAGNOSIS — K219 Gastro-esophageal reflux disease without esophagitis: Secondary | ICD-10-CM | POA: Insufficient documentation

## 2014-03-09 DIAGNOSIS — R140 Abdominal distension (gaseous): Secondary | ICD-10-CM | POA: Insufficient documentation

## 2014-03-09 LAB — URINALYSIS, ROUTINE W REFLEX MICROSCOPIC
Bilirubin Urine: NEGATIVE
GLUCOSE, UA: NEGATIVE mg/dL
Ketones, ur: NEGATIVE mg/dL
Nitrite: NEGATIVE
PH: 6.5 (ref 5.0–8.0)
Protein, ur: NEGATIVE mg/dL
Specific Gravity, Urine: 1.007 (ref 1.005–1.030)
Urobilinogen, UA: 1 mg/dL (ref 0.0–1.0)

## 2014-03-09 LAB — CBC WITH DIFFERENTIAL/PLATELET
BASOS ABS: 0 10*3/uL (ref 0.0–0.1)
Basophils Relative: 0 % (ref 0–1)
Eosinophils Absolute: 0.1 10*3/uL (ref 0.0–0.7)
Eosinophils Relative: 1 % (ref 0–5)
HCT: 29.6 % — ABNORMAL LOW (ref 36.0–46.0)
Hemoglobin: 9.8 g/dL — ABNORMAL LOW (ref 12.0–15.0)
LYMPHS PCT: 13 % (ref 12–46)
Lymphs Abs: 0.7 10*3/uL (ref 0.7–4.0)
MCH: 31.7 pg (ref 26.0–34.0)
MCHC: 33.1 g/dL (ref 30.0–36.0)
MCV: 95.8 fL (ref 78.0–100.0)
Monocytes Absolute: 0.6 10*3/uL (ref 0.1–1.0)
Monocytes Relative: 10 % (ref 3–12)
NEUTROS PCT: 76 % (ref 43–77)
Neutro Abs: 4.3 10*3/uL (ref 1.7–7.7)
PLATELETS: 206 10*3/uL (ref 150–400)
RBC: 3.09 MIL/uL — ABNORMAL LOW (ref 3.87–5.11)
RDW: 14 % (ref 11.5–15.5)
WBC: 5.7 10*3/uL (ref 4.0–10.5)

## 2014-03-09 LAB — COMPREHENSIVE METABOLIC PANEL
ALK PHOS: 82 U/L (ref 39–117)
ALT: 20 U/L (ref 0–35)
AST: 31 U/L (ref 0–37)
Albumin: 3.4 g/dL — ABNORMAL LOW (ref 3.5–5.2)
Anion gap: 14 (ref 5–15)
BUN: 25 mg/dL — ABNORMAL HIGH (ref 6–23)
CHLORIDE: 104 meq/L (ref 96–112)
CO2: 24 meq/L (ref 19–32)
Calcium: 9.1 mg/dL (ref 8.4–10.5)
Creatinine, Ser: 1.14 mg/dL — ABNORMAL HIGH (ref 0.50–1.10)
GFR calc Af Amer: 46 mL/min — ABNORMAL LOW (ref >90)
GFR, EST NON AFRICAN AMERICAN: 40 mL/min — AB (ref >90)
Glucose, Bld: 96 mg/dL (ref 70–99)
POTASSIUM: 4.7 meq/L (ref 3.7–5.3)
SODIUM: 142 meq/L (ref 137–147)
Total Bilirubin: 0.4 mg/dL (ref 0.3–1.2)
Total Protein: 6.5 g/dL (ref 6.0–8.3)

## 2014-03-09 LAB — I-STAT TROPONIN, ED: Troponin i, poc: 0.01 ng/mL (ref 0.00–0.08)

## 2014-03-09 LAB — URINE MICROSCOPIC-ADD ON

## 2014-03-09 LAB — LIPASE, BLOOD: Lipase: 68 U/L — ABNORMAL HIGH (ref 11–59)

## 2014-03-09 MED ORDER — IOHEXOL 300 MG/ML  SOLN
80.0000 mL | Freq: Once | INTRAMUSCULAR | Status: AC | PRN
  Administered 2014-03-09: 80 mL via INTRAVENOUS

## 2014-03-09 MED ORDER — CEPHALEXIN 500 MG PO CAPS: 500.0000 mg | ORAL_CAPSULE | Freq: Four times a day (QID) | ORAL | Status: DC

## 2014-03-09 MED ORDER — IOHEXOL 300 MG/ML  SOLN
25.0000 mL | Freq: Once | INTRAMUSCULAR | Status: AC | PRN
  Administered 2014-03-09: 25 mL via ORAL

## 2014-03-09 NOTE — ED Notes (Signed)
Secretary called [facility] to [facility].

## 2014-03-09 NOTE — ED Notes (Signed)
[facility].

## 2014-03-09 NOTE — ED Notes (Signed)
Pt remains alert to self only , pt pulled out her IV , cleaned pt up and restarted IV in right forearm

## 2014-03-09 NOTE — ED Notes (Signed)
[facility]

## 2014-03-09 NOTE — Discharge Instructions (Signed)
CT is negative for acute abnormality  Urinary Tract Infection Urinary tract infections (UTIs) can develop anywhere along your urinary tract. Your urinary tract is your body's drainage system for removing wastes and extra water. Your urinary tract includes two kidneys, two ureters, a bladder, and a urethra. Your kidneys are a pair of bean-shaped organs. Each kidney is about the size of your fist. They are located below your ribs, one on each side of your spine. CAUSES Infections are caused by microbes, which are microscopic organisms, including fungi, viruses, and bacteria. These organisms are so small that they can only be seen through a microscope. Bacteria are the microbes that most commonly cause UTIs. SYMPTOMS  Symptoms of UTIs may vary by age and gender of the patient and by the location of the infection. Symptoms in young women typically include a frequent and intense urge to urinate and a painful, burning feeling in the bladder or urethra during urination. Older women and men are more likely to be tired, shaky, and weak and have muscle aches and abdominal pain. A fever may mean the infection is in your kidneys. Other symptoms of a kidney infection include pain in your back or sides below the ribs, nausea, and vomiting. DIAGNOSIS To diagnose a UTI, your caregiver will ask you about your symptoms. Your caregiver also will ask to provide a urine sample. The urine sample will be tested for bacteria and white blood cells. White blood cells are made by your body to help fight infection. TREATMENT  Typically, UTIs can be treated with medication. Because most UTIs are caused by a bacterial infection, they usually can be treated with the use of antibiotics. The choice of antibiotic and length of treatment depend on your symptoms and the type of bacteria causing your infection. HOME CARE INSTRUCTIONS  If you were prescribed antibiotics, take them exactly as your caregiver instructs you. Finish the  medication even if you feel better after you have only taken some of the medication.  Drink enough water and fluids to keep your urine clear or pale yellow.  Avoid caffeine, tea, and carbonated beverages. They tend to irritate your bladder.  Empty your bladder often. Avoid holding urine for long periods of time.  Empty your bladder before and after sexual intercourse.  After a bowel movement, women should cleanse from front to back. Use each tissue only once. SEEK MEDICAL CARE IF:   You have back pain.  You develop a fever.  Your symptoms do not begin to resolve within 3 days. SEEK IMMEDIATE MEDICAL CARE IF:   You have severe back pain or lower abdominal pain.  You develop chills.  You have nausea or vomiting.  You have continued burning or discomfort with urination. MAKE SURE YOU:   Understand these instructions.  Will watch your condition.  Will get help right away if you are not doing well or get worse. Document Released: 02/13/2005 Document Revised: 11/05/2011 Document Reviewed: 06/14/2011 [facility] Patient Information 2015 [place], [place]. This information is not intended to replace advice given to you by your health care provider. Make sure you discuss any questions you have with your health care provider.  Abdominal Pain Many things can cause abdominal pain. Usually, abdominal pain is not caused by a disease and will improve without treatment. It can often be observed and treated at home. Your health care provider will do a physical exam and possibly order blood tests and X-rays to help determine the seriousness of your pain. However, in many cases,  more time must pass before a clear cause of the pain can be found. Before that point, your health care provider may not know if you need more testing or further treatment. HOME CARE INSTRUCTIONS  Monitor your abdominal pain for any changes. The following actions may help to alleviate any discomfort you are  experiencing:  Only take over-the-counter or prescription medicines as directed by your health care provider.  Do not take laxatives unless directed to do so by your health care provider.  Try a clear liquid diet (broth, tea, or water) as directed by your health care provider. Slowly move to a bland diet as tolerated. SEEK MEDICAL CARE IF:  You have unexplained abdominal pain.  You have abdominal pain associated with nausea or diarrhea.  You have pain when you urinate or have a bowel movement.  You experience abdominal pain that wakes you in the night.  You have abdominal pain that is worsened or improved by eating food.  You have abdominal pain that is worsened with eating fatty foods.  You have a fever. SEEK IMMEDIATE MEDICAL CARE IF:   Your pain does not go away within 2 hours.  You keep throwing up (vomiting).  Your pain is felt only in portions of the abdomen, such as the right side or the left lower portion of the abdomen.  You pass bloody or black tarry stools. MAKE SURE YOU:  Understand these instructions.   Will watch your condition.   Will get help right away if you are not doing well or get worse.  Document Released: 02/13/2005 Document Revised: 05/11/2013 Document Reviewed: 01/13/2013 [facility] Patient Information 2015 [place], [place]. This information is not intended to replace advice given to you by your health care provider. Make sure you discuss any questions you have with your health care provider.

## 2014-03-09 NOTE — ED Provider Notes (Signed)
Medical screening examination/treatment/procedure(s) were conducted as a shared visit with non-physician practitioner(s) and myself.  I personally evaluated the patient during the encounter.   EKG Interpretation   Date/Time:  Wednesday March 09 2014 11:42:09 EDT Ventricular Rate:  74 PR Interval:  189 QRS Duration: 83 QT Interval:  443 QTC Calculation: 491 R Axis:   13 Text Interpretation:  Sinus rhythm Borderline prolonged QT interval No  significant change since last tracing Confirmed by Bebe ShaggyWICKLINE  MD, Loeta Herst  [phone]([id number]) on 03/09/2014 11:46:11 AM        Joya Gaskinsonald W Javel Hersh, MD 03/09/14 1610

## 2014-03-09 NOTE — ED Notes (Signed)
[facility]/o new onset afib and some abd distention, pt was given .5 xanax  at nursing home for agitation , pt is alert to self only which is her norm

## 2014-03-09 NOTE — ED Notes (Signed)
Son -- Marisa HuaRodney Menger -- [phone].

## 2014-03-09 NOTE — ED Notes (Signed)
Returned from Ct scan  

## 2014-03-09 NOTE — ED Notes (Signed)
Patient  Brought back to use bedpan

## 2014-03-09 NOTE — ED Provider Notes (Signed)
4:06 PM BP 169/62  Pulse 72  Temp(Src) 98 F (36.7 C) (Oral)  Resp 19  SpO2 96%    Awaiting CT scan for questionable abdominal distension. Home to memory care unit if ct neg    5:22 PM BP 143/74  Pulse 68  Temp(Src) 98 F (36.7 C) (Oral)  Resp 16  SpO2 100%  Patient  CT  With lumbar burst fracture L1 and L5 of indeterminate age. There is no tenderness to palpation of the lumbar spine, strength is 4/5 in lower extremities. No abdominal tenderness on exam. Possible UTI. Patient urine sent for culture. D/c with keflex     Arthor CaptainAbigail Jonathandavid Marlett, PA-C 03/09/14 1752

## 2014-03-09 NOTE — ED Provider Notes (Signed)
Patient seen/examined in the Emergency Department in [facility] Patient reports abdominal pain Exam : awake/alert, diffuse mild abd tenderness Plan: CT imaging pending  EKG here in the ED does not reveal atrial fibrillation   Joya Gaskinsonald W Jannell Franta, MD 03/09/14 1354

## 2014-03-09 NOTE — ED Provider Notes (Signed)
CSN: [id number]     Arrival date & time 03/09/14  1125 History   First MD Initiated Contact with Patient 03/09/14 1139     Chief Complaint  Patient presents with  . Abdominal Pain  . Atrial Fibrillation     (Consider location/radiation/quality/duration/timing/severity/associated sxs/prior Treatment) HPI Comments: Patient presents to the emergency department from memory care unit, with chief complaint of abdominal distention. According to EMS, she was also found to be in A. fib which is new for her. Patient is poor historian secondary to dementia. Level V caveat applies. She denies any chest pain or abdominal pain at this time. Patient was given 0.5 Xanax for agitation.  The history is provided by the patient. No language interpreter was used.    Past Medical History  Diagnosis Date  . GERD (gastroesophageal reflux disease)   . Anxiety   . Dementia   . Alzheimer disease   . COPD (chronic obstructive pulmonary disease)   . Hypertension   . Renal disorder    History reviewed. No pertinent past surgical history. History reviewed. No pertinent family history. History  Substance Use Topics  . Smoking status: Never Smoker   . Smokeless tobacco: Not on file  . Alcohol Use: No   OB History   Grav Para Term Preterm Abortions TAB SAB Ect Mult Living                 Review of Systems  Unable to perform ROS: Dementia      Allergies  Review of patient's allergies indicates no known allergies.  Home Medications   Prior to Admission medications   Medication Sig Start Date End Date Taking? Authorizing Provider  acetaminophen (TYLENOL) 500 MG tablet Take 500 mg by mouth 3 (three) times daily. For pain    Historical Provider, MD  ALPRAZolam Prudy Feeler) 0.5 MG tablet Take 0.25 mg by mouth every 4 (four) hours as needed for anxiety.     Historical Provider, MD  aluminum & magnesium hydroxide (MAALOX) 225-200 MG/5ML suspension Take 30 mLs by mouth 4 (four) times daily as needed for  indigestion.    Historical Provider, MD  bumetanide (BUMEX) 0.5 MG tablet Take 0.5 mg by mouth 2 (two) times a week. On Monday and Thursday    Historical Provider, MD  citalopram (CELEXA) 10 MG tablet Take 10 mg by mouth daily.    Historical Provider, MD  donepezil (ARICEPT) 10 MG tablet Take 10 mg by mouth at bedtime.    Historical Provider, MD  guaiFENesin (ROBITUSSIN) 100 MG/5ML liquid Take 200 mg by mouth every 6 (six) hours as needed for cough. For cough    Historical Provider, MD  lisinopril (PRINIVIL,ZESTRIL) 10 MG tablet Take 10 mg by mouth daily.    Historical Provider, MD  loperamide (LOPERAMIDE A-D) 2 MG tablet Take 2 mg by mouth 4 (four) times daily as needed for diarrhea or loose stools.     Historical Provider, MD  magnesium hydroxide (MILK OF MAGNESIA) 800 MG/5ML suspension Take 30 mLs by mouth at bedtime as needed for constipation. For constipation.    Historical Provider, MD  potassium chloride (K-DUR,KLOR-CON) 10 MEQ tablet Take 10 mEq by mouth daily.    Historical Provider, MD  risperiDONE (RISPERDAL) 0.25 MG tablet Take 0.25 mg by mouth at bedtime. 09/02/12   Hurman Horn, MD   BP 154/59  Pulse 73  Temp(Src) 98 F (36.7 C) (Oral)  Resp 18  SpO2 100% Physical Exam  Nursing note and vitals reviewed. Constitutional:  She is oriented to person, place, and time. She appears well-developed and well-nourished.  HENT:  Head: Normocephalic and atraumatic.  Eyes: Conjunctivae and EOM are normal. Pupils are equal, round, and reactive to light.  Neck: Normal range of motion. Neck supple.  Cardiovascular: Normal rate and regular rhythm.  Exam reveals no gallop and no friction rub.   No murmur heard. Pulmonary/Chest: Effort normal and breath sounds normal. No respiratory distress. She has no wheezes. She has no rales. She exhibits no tenderness.  Abdominal: Soft. She exhibits no distension and no mass. There is no tenderness. There is no rebound and no guarding.  Musculoskeletal:  Normal range of motion. She exhibits no edema and no tenderness.  Neurological: She is alert and oriented to person, place, and time.  Skin: Skin is warm and dry.  Psychiatric: She has a normal mood and affect. Her behavior is normal. Judgment and thought content normal.    ED Course  Procedures (including critical care time) Results for orders placed during the hospital encounter of 03/09/14  CBC WITH DIFFERENTIAL      Result Value Ref Range   WBC 5.7  4.0 - 10.5 K/uL   RBC 3.09 (*) 3.87 - 5.11 MIL/uL   Hemoglobin 9.8 (*) 12.0 - 15.0 g/dL   HCT 16.129.6 (*) 09.636.0 - 04.546.0 %   MCV 95.8  78.0 - 100.0 fL   MCH 31.7  26.0 - 34.0 pg   MCHC 33.1  30.0 - 36.0 g/dL   RDW 40.914.0  81.111.5 - 91.415.5 %   Platelets 206  150 - 400 K/uL   Neutrophils Relative % 76  43 - 77 %   Neutro Abs 4.3  1.7 - 7.7 K/uL   Lymphocytes Relative 13  12 - 46 %   Lymphs Abs 0.7  0.7 - 4.0 K/uL   Monocytes Relative 10  3 - 12 %   Monocytes Absolute 0.6  0.1 - 1.0 K/uL   Eosinophils Relative 1  0 - 5 %   Eosinophils Absolute 0.1  0.0 - 0.7 K/uL   Basophils Relative 0  0 - 1 %   Basophils Absolute 0.0  0.0 - 0.1 K/uL  COMPREHENSIVE METABOLIC PANEL      Result Value Ref Range   Sodium 142  137 - 147 mEq/L   Potassium 4.7  3.7 - 5.3 mEq/L   Chloride 104  96 - 112 mEq/L   CO2 24  19 - 32 mEq/L   Glucose, Bld 96  70 - 99 mg/dL   BUN 25 (*) 6 - 23 mg/dL   Creatinine, Ser 7.821.14 (*) 0.50 - 1.10 mg/dL   Calcium 9.1  8.4 - 95.610.5 mg/dL   Total Protein 6.5  6.0 - 8.3 g/dL   Albumin 3.4 (*) 3.5 - 5.2 g/dL   AST 31  0 - 37 U/L   ALT 20  0 - 35 U/L   Alkaline Phosphatase 82  39 - 117 U/L   Total Bilirubin 0.4  0.3 - 1.2 mg/dL   GFR calc non Af Amer 40 (*) >90 mL/min   GFR calc Af Amer 46 (*) >90 mL/min   Anion gap 14  5 - 15  LIPASE, BLOOD      Result Value Ref Range   Lipase 68 (*) 11 - 59 U/L  URINALYSIS, ROUTINE W REFLEX MICROSCOPIC      Result Value Ref Range   Color, Urine YELLOW  YELLOW   APPearance CLOUDY (*) CLEAR  Specific Gravity, Urine 1.007  1.005 - 1.030   pH 6.5  5.0 - 8.0   Glucose, UA NEGATIVE  NEGATIVE mg/dL   Hgb urine dipstick LARGE (*) NEGATIVE   Bilirubin Urine NEGATIVE  NEGATIVE   Ketones, ur NEGATIVE  NEGATIVE mg/dL   Protein, ur NEGATIVE  NEGATIVE mg/dL   Urobilinogen, UA 1.0  0.0 - 1.0 mg/dL   Nitrite NEGATIVE  NEGATIVE   Leukocytes, UA TRACE (*) NEGATIVE  URINE MICROSCOPIC-ADD ON      Result Value Ref Range   Squamous Epithelial / LPF RARE  RARE   WBC, UA 0-2  <3 WBC/hpf   RBC / HPF 0-2  <3 RBC/hpf   Bacteria, UA FEW (*) RARE   Urine-Other AMORPHOUS URATES/PHOSPHATES    I-STAT TROPOININ, ED      Result Value Ref Range   Troponin i, poc 0.01  0.00 - 0.08 ng/mL   Comment 3            No results found.    EKG Interpretation   Date/Time:  Wednesday March 09 2014 11:42:09 EDT Ventricular Rate:  74 PR Interval:  189 QRS Duration: 83 QT Interval:  443 QTC Calculation: 491 R Axis:   13 Text Interpretation:  Sinus rhythm Borderline prolonged QT interval No  significant change since last tracing Confirmed by Bebe ShaggyWICKLINE  MD, DONALD  [phone]([id number]) on 03/09/2014 11:46:11 AM      MDM   Final diagnoses:  Abdominal distension    Patient with reported abdominal distension.  Appears well here, but patient is unreliable 2/2 dementia.  NSR on EKG.  12:58 PM Patient seen by and discussed with Dr. Bebe ShaggyWickline, who agrees with plan for CT and discharge to home if labs and imaging are all negative.  Patient does not appear to be in any distress, but poor historian 2/2 dementia.  4:08 PM Patient signed out to Tyson FoodsHarris, PA-C, who will continue care.  Follow-up on CT.  If negative, reassess and plan for discharge to home.  Roxy Horsemanobert San Lohmeyer, PA-C 03/09/14 [phone]

## 2014-03-09 NOTE — ED Notes (Signed)
Patient transported to CT 

## 2014-03-09 NOTE — ED Provider Notes (Signed)
Medical screening examination/treatment/procedure(s) were performed by non-physician practitioner and as supervising physician I was immediately available for consultation/collaboration.   EKG Interpretation   Date/Time:  Wednesday March 09 2014 11:42:09 EDT Ventricular Rate:  74 PR Interval:  189 QRS Duration: 83 QT Interval:  443 QTC Calculation: 491 R Axis:   13 Text Interpretation:  Sinus rhythm Borderline prolonged QT interval No  significant change since last tracing Confirmed by Bebe ShaggyWICKLINE  MD, DONALD  [phone]([id number]) on 03/09/2014 11:46:11 AM        Enid SkeensJoshua M Raven Furnas, MD 03/09/14 2349

## 2014-03-10 LAB — URINE CULTURE

## 2014-04-12 ENCOUNTER — Encounter (HOSPITAL_COMMUNITY): Payer: Self-pay | Admitting: *Deleted

## 2014-04-12 ENCOUNTER — Emergency Department (HOSPITAL_COMMUNITY)
Admission: EM | Admit: 2014-04-12 | Discharge: 2014-04-13 | Disposition: A | Discharge status: Home / Self Care | Payer: Medicare Other | Attending: Emergency Medicine | Admitting: Emergency Medicine

## 2014-04-12 DIAGNOSIS — S6991XA Unspecified injury of right wrist, hand and finger(s), initial encounter: Secondary | ICD-10-CM | POA: Insufficient documentation

## 2014-04-12 DIAGNOSIS — J449 Chronic obstructive pulmonary disease, unspecified: Secondary | ICD-10-CM | POA: Insufficient documentation

## 2014-04-12 DIAGNOSIS — F0281 Dementia in other diseases classified elsewhere with behavioral disturbance: Secondary | ICD-10-CM | POA: Diagnosis not present

## 2014-04-12 DIAGNOSIS — Z8719 Personal history of other diseases of the digestive system: Secondary | ICD-10-CM | POA: Diagnosis not present

## 2014-04-12 DIAGNOSIS — Y9389 Activity, other specified: Secondary | ICD-10-CM | POA: Insufficient documentation

## 2014-04-12 DIAGNOSIS — W1839XA Other fall on same level, initial encounter: Secondary | ICD-10-CM | POA: Insufficient documentation

## 2014-04-12 DIAGNOSIS — I10 Essential (primary) hypertension: Secondary | ICD-10-CM | POA: Diagnosis not present

## 2014-04-12 DIAGNOSIS — Z79899 Other long term (current) drug therapy: Secondary | ICD-10-CM | POA: Insufficient documentation

## 2014-04-12 DIAGNOSIS — Z792 Long term (current) use of antibiotics: Secondary | ICD-10-CM | POA: Insufficient documentation

## 2014-04-12 DIAGNOSIS — Y998 Other external cause status: Secondary | ICD-10-CM | POA: Insufficient documentation

## 2014-04-12 DIAGNOSIS — Y92129 Unspecified place in nursing home as the place of occurrence of the external cause: Secondary | ICD-10-CM | POA: Diagnosis not present

## 2014-04-12 DIAGNOSIS — Z87448 Personal history of other diseases of urinary system: Secondary | ICD-10-CM | POA: Insufficient documentation

## 2014-04-12 DIAGNOSIS — F419 Anxiety disorder, unspecified: Secondary | ICD-10-CM | POA: Diagnosis not present

## 2014-04-12 DIAGNOSIS — W19XXXA Unspecified fall, initial encounter: Secondary | ICD-10-CM
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
## 2014-04-12 NOTE — ED Notes (Signed)
Pt denies pain when asked, no pain when touching R wrist, no deformity noted, pt confused at baseline, in no distress.
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
## 2014-04-13 NOTE — Discharge Instructions (Signed)
Patient was examined in the emergency department today.  At time of examination, patient denied any pain in either wrist.  She had normal exam aside from older bruising.  No other injury identified.

## 2014-04-18 IMAGING — CT CT CERVICAL SPINE W/O CM
2 of 4 series · 5 of 14 positions shown, 6 images · non-contrast
Comparison: 12/06/2012

CLINICAL DATA: Fall.  Anxiety.  Dementia.  Alzheimer's disease.

EXAM:
CT HEAD WITHOUT CONTRAST
CT CERVICAL SPINE WITHOUT CONTRAST
TECHNIQUE: Multidetector CT imaging of the head and cervical spine was
performed following the standard protocol without intravenous
contrast. Multiplanar CT image reconstructions of the cervical spine
were also generated.

[Series 4: c-spine st · axial · 0.23mm/px · z∈[-150,-104]mm · 2 of 71 slices shown]
[im 24/71  bone]
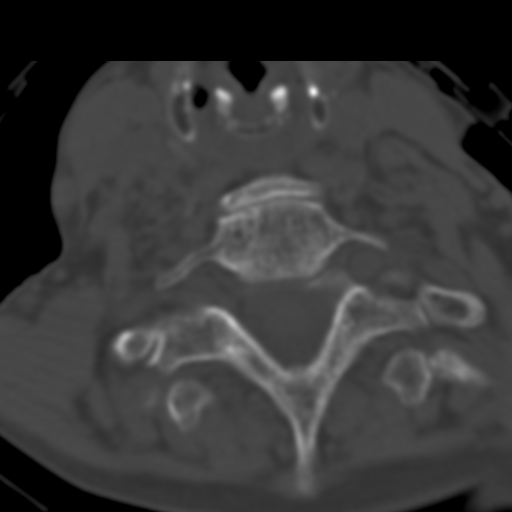
[im 47/71  bone]
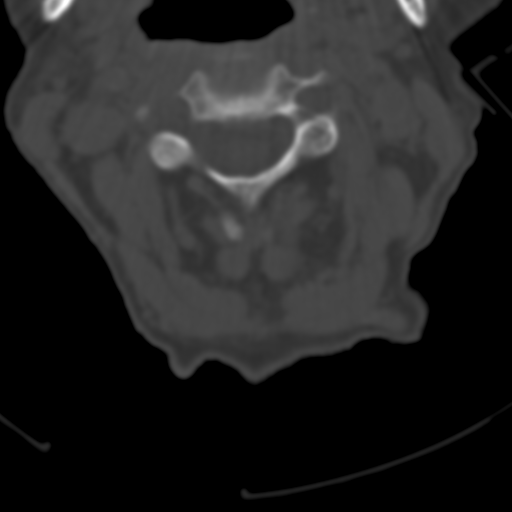

[Series 7: axial recon · axial · 0.23mm/px · z∈[-189,-119]mm · 3 of 80 slices shown, 4 images]
[im 20/80  soft-tissue]
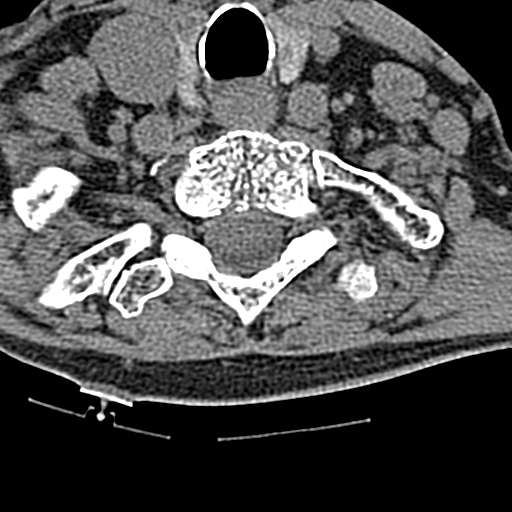
[im 20/80  bone]
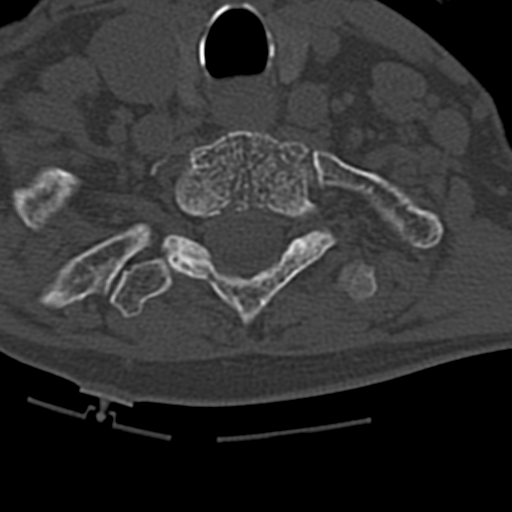
[im 40/80  bone]
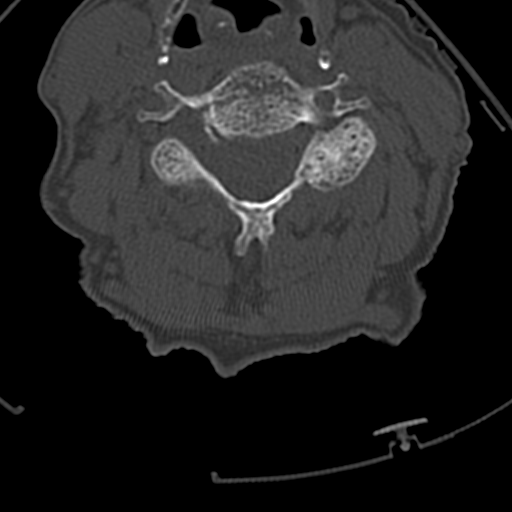
[im 60/80  bone]
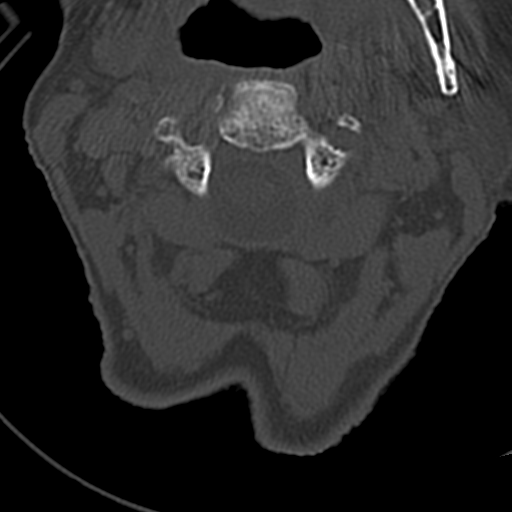

[5 of 14 positions shown; findings below may reference images not displayed]

FINDINGS: CT HEAD FINDINGS

The brainstem, cerebellum, cerebral peduncles, thalamus, basal
ganglia, basilar cisterns, and ventricular system appear within
normal limits. No intracranial hemorrhage, mass lesion, or acute
CVA. Periventricular white matter and corona radiata hypodensities
favor chronic ischemic microvascular white matter disease. Right
forehead scalp hematoma observed.

CT CERVICAL SPINE FINDINGS

Multilevel subluxations in the cervical spine are similar to prior
and included 2 mm posterior subluxation at C3-4, 2 mm anterior
subluxation at C5-6, and 1.5 mm anterior subluxation at C6-7, with
loss of disc height at C3-4 and C6-7 as before. There is associated
degenerative facet arthropathy. No new fracture or new subluxation.
No prevertebral soft tissue swelling.

Biapical pleural parenchymal scarring is symmetric and has
associated calcifications.
IMPRESSION: 1. No acute intracranial findings. Periventricular white matter and
corona radiata hypodensities favor chronic ischemic microvascular
white matter disease.
2. Right forehead scalp hematoma.
3. Stable degenerative cervical spine subluxations, without
fracture, acute subluxation, or acute cervical spine findings.
4. Biapical pleural parenchymal scarring in the lungs.

## 2014-04-22 ENCOUNTER — Emergency Department (HOSPITAL_COMMUNITY)
Admission: EM | Admit: 2014-04-22 | Discharge: 2014-04-22 | Disposition: A | Discharge status: Home / Self Care | Payer: Medicare Other | Attending: Emergency Medicine | Admitting: Emergency Medicine

## 2014-04-22 ENCOUNTER — Encounter (HOSPITAL_COMMUNITY): Payer: Self-pay | Admitting: Emergency Medicine

## 2014-04-22 DIAGNOSIS — W06XXXA Fall from bed, initial encounter: Secondary | ICD-10-CM | POA: Diagnosis not present

## 2014-04-22 DIAGNOSIS — F0280 Dementia in other diseases classified elsewhere without behavioral disturbance: Secondary | ICD-10-CM | POA: Insufficient documentation

## 2014-04-22 DIAGNOSIS — Z79899 Other long term (current) drug therapy: Secondary | ICD-10-CM | POA: Diagnosis not present

## 2014-04-22 DIAGNOSIS — Y92128 Other place in nursing home as the place of occurrence of the external cause: Secondary | ICD-10-CM | POA: Insufficient documentation

## 2014-04-22 DIAGNOSIS — Y9389 Activity, other specified: Secondary | ICD-10-CM | POA: Diagnosis not present

## 2014-04-22 DIAGNOSIS — S59911A Unspecified injury of right forearm, initial encounter: Secondary | ICD-10-CM | POA: Diagnosis present

## 2014-04-22 DIAGNOSIS — K219 Gastro-esophageal reflux disease without esophagitis: Secondary | ICD-10-CM | POA: Insufficient documentation

## 2014-04-22 DIAGNOSIS — G309 Alzheimer's disease, unspecified: Secondary | ICD-10-CM | POA: Insufficient documentation

## 2014-04-22 DIAGNOSIS — F419 Anxiety disorder, unspecified: Secondary | ICD-10-CM | POA: Diagnosis not present

## 2014-04-22 DIAGNOSIS — Z8742 Personal history of other diseases of the female genital tract: Secondary | ICD-10-CM | POA: Diagnosis not present

## 2014-04-22 DIAGNOSIS — Y998 Other external cause status: Secondary | ICD-10-CM | POA: Insufficient documentation

## 2014-04-22 DIAGNOSIS — S5011XA Contusion of right forearm, initial encounter: Secondary | ICD-10-CM | POA: Diagnosis not present

## 2014-04-22 DIAGNOSIS — W19XXXA Unspecified fall, initial encounter: Secondary | ICD-10-CM

## 2014-04-22 DIAGNOSIS — Z792 Long term (current) use of antibiotics: Secondary | ICD-10-CM | POA: Insufficient documentation

## 2014-04-22 DIAGNOSIS — I10 Essential (primary) hypertension: Secondary | ICD-10-CM | POA: Diagnosis not present

## 2014-04-22 DIAGNOSIS — Y92129 Unspecified place in nursing home as the place of occurrence of the external cause: Secondary | ICD-10-CM

## 2014-04-22 DIAGNOSIS — J449 Chronic obstructive pulmonary disease, unspecified: Secondary | ICD-10-CM | POA: Insufficient documentation

## 2014-04-22 NOTE — ED Provider Notes (Signed)
CSN: [id number]     Arrival date & time 04/22/14  0118 History   First MD Initiated Contact with Patient 04/22/14 0124     Chief Complaint  Patient presents with  . Fall  . Tailbone Pain  . Elbow Pain    Level V: Dementia  (Consider location/radiation/quality/duration/timing/severity/associated sxs/prior Treatment) HPI [name] is a 80 y.o. female with a hx of dementia who presents to the ED today via EMS for a potential unwitnessed fall. Spoke with third shift nurse, [name] at [facility] where patient stays. She reports patient has been acting at her baseline and eating and drinking appropriately. She reports at 12:15 this morning and they were making rounds and discomfort. Patient was sitting on the floor by her bed with pillows under her legs. They have assumed she fell. At that time patient reported left buttock pain. Upon arrival in the ED she does not complain of any pain. She reports being asymptomatic  Past Medical History  Diagnosis Date  . GERD (gastroesophageal reflux disease)   . Anxiety   . Dementia   . Alzheimer disease   . COPD (chronic obstructive pulmonary disease)   . Hypertension   . Renal disorder    History reviewed. No pertinent past surgical history. History reviewed. No pertinent family history. History  Substance Use Topics  . Smoking status: Never Smoker   . Smokeless tobacco: Not on file  . Alcohol Use: No   OB History    No data available     Review of Systems  Unable to perform ROS     Allergies  Review of patient's allergies indicates no known allergies.  Home Medications   Prior to Admission medications   Medication Sig Start Date End Date Taking? Authorizing Provider  ALPRAZolam (XANAX) 0.25 MG tablet Take 0.25 mg by mouth every 6 (six) hours as needed for anxiety (agitation). Not to exceed 6 tablets in 24 hours    Historical Provider, MD  alum & mag hydroxide-simeth (MAALOX/MYLANTA) 200-200-20  MG/5ML suspension Take 30 mLs by mouth every 6 (six) hours as needed for indigestion or heartburn.    Historical Provider, MD  bumetanide (BUMEX) 0.5 MG tablet Take 0.5 mg by mouth 2 (two) times a week. On Monday and Thursday    Historical Provider, MD  cephALEXin (KEFLEX) 500 MG capsule Take 1 capsule (500 mg total) by mouth 4 (four) times daily. Patient not taking: Reported on 04/13/2014 03/09/14   Arthor CaptainAbigail Harris, PA-C  citalopram (CELEXA) 10 MG tablet Take 10 mg by mouth daily.    Historical Provider, MD  donepezil (ARICEPT) 10 MG tablet Take 10 mg by mouth at bedtime.    Historical Provider, MD  ENSURE (ENSURE) Take 237 mLs by mouth 3 (three) times daily with meals.    Historical Provider, MD  guaifenesin (ROBITUSSIN) 100 MG/5ML syrup Take 200 mg by mouth every 6 (six) hours as needed for cough (not to exceed 4 doses in 24 hours).    Historical Provider, MD  lisinopril (PRINIVIL,ZESTRIL) 10 MG tablet Take 10 mg by mouth daily.    Historical Provider, MD  loperamide (IMODIUM) 2 MG capsule Take 2 mg by mouth as needed for diarrhea or loose stools (not to exceed 8 doses in 24 hours).    Historical Provider, MD  magnesium hydroxide (MILK OF MAGNESIA) 400 MG/5ML suspension Take 30 mLs by mouth at bedtime as needed for mild constipation.    Historical Provider, MD  mirtazapine (REMERON) 30 MG  tablet Take 30 mg by mouth at bedtime.    Historical Provider, MD  neomycin-bacitracin-polymyxin (NEOSPORIN) [phone] ointment Apply 1 application topically as directed. After cleaning with normal saline, cover with bandage or gauze and secure with tape. Change as needed until healed for skin tears, abrasions or minor irritations.    Historical Provider, MD  potassium chloride (K-DUR,KLOR-CON) 10 MEQ tablet Take 10 mEq by mouth daily.    Historical Provider, MD  risperiDONE (RISPERDAL) 0.25 MG tablet Take 0.25 mg by mouth at bedtime. 09/02/12   Hurman HornJohn M Bednar, MD  Vitamin D, Ergocalciferol, (DRISDOL) 50000 UNITS  CAPS capsule Take 50,000 Units by mouth every Thursday.    Historical Provider, MD   BP 157/76 mmHg  Pulse 76  Temp(Src) 97.8 F (36.6 C) (Oral)  Resp 19  SpO2 98% Physical Exam  Constitutional: She is oriented to person, place, and time. She appears well-developed and well-nourished.  HENT:  Head: Normocephalic and atraumatic.  Mouth/Throat: Oropharynx is clear and moist.  Eyes: Conjunctivae are normal. Pupils are equal, round, and reactive to light. Right eye exhibits no discharge. Left eye exhibits no discharge. No scleral icterus.  Neck: Neck supple. No tracheal deviation present.  Cardiovascular: Normal rate, regular rhythm and normal heart sounds.   Pulmonary/Chest: Effort normal and breath sounds normal. No respiratory distress. She has no wheezes. She has no rales.  Abdominal: Soft. There is no tenderness.  Musculoskeletal: She exhibits no tenderness.  Patient exhibits full range of motion of both elbows/wrists without discomfort or tenderness to palpation. Small ecchymosis to right proximal forearm. Patient is able to range both right and left hip and lower extremity without discomfort. No tenderness to palpation of cervical, thoracic or lumbar spine or to pelvis.  Neurological: She is alert and oriented to person, place, and time.  Cranial Nerves II-XII grossly intact  Skin: Skin is warm and dry. No rash noted.  Psychiatric: She has a normal mood and affect.  Nursing note and vitals reviewed.   ED Course  Procedures (including critical care time) Labs Review Labs Reviewed - No data to display  Imaging Review No results found.   EKG Interpretation None      MDM  Sherri ArnGeraldine Ramos is a 80 y.o. female history of dementia who presents today from memory care facility, concern for fall.  Vitals stable - WNL -afebrile Pt resting comfortably in ED. Reports being asymptomatic at this time PE--not concerning further acute or emergent pathology. Patient is able to range  all 4 extremities without difficulty or discomfort. No ttp of spine or pelvis. Benign abd exam.  Patient may be discharged back to memory care facility. Prior to patient discharge, I discussed and reviewed this case with Dr. Patria Maneampos, who also saw and evaluated the patient    Final diagnoses:  Fall at nursing home, initial encounter        Sharlene MottsBenjamin W Nazar Kuan, PA-C 04/22/14 0323  Lyanne CoKevin M Campos, MD 04/22/14 [phone]
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
## 2014-04-22 NOTE — ED Notes (Signed)
[facility] called  

## 2014-04-22 NOTE — Discharge Instructions (Signed)
Fall Prevention and Home Safety Falls cause injuries and can affect all age groups. It is possible to use preventive measures to significantly decrease the likelihood of falls. There are many simple measures which can make your home safer and prevent falls. OUTDOORS  Repair cracks and edges of walkways and driveways.  Remove high doorway thresholds.  Trim shrubbery on the main path into your home.  Have good outside lighting.  Clear walkways of tools, rocks, debris, and clutter.  Check that handrails are not broken and are securely fastened. Both sides of steps should have handrails.  Have leaves, snow, and ice cleared regularly.  Use sand or salt on walkways during winter months.  In the garage, clean up grease or oil spills. BATHROOM  Install night lights.  Install grab bars by the toilet and in the tub and shower.  Use non-skid mats or decals in the tub or shower.  Place a plastic non-slip stool in the shower to sit on, if needed.  Keep floors dry and clean up all water on the floor immediately.  Remove soap buildup in the tub or shower on a regular basis.  Secure bath mats with non-slip, double-sided rug tape.  Remove throw rugs and tripping hazards from the floors. BEDROOMS  Install night lights.  Make sure a bedside light is easy to reach.  Do not use oversized bedding.  Keep a telephone by your bedside.  Have a firm chair with side arms to use for getting dressed.  Remove throw rugs and tripping hazards from the floor. KITCHEN  Keep handles on pots and pans turned toward the center of the stove. Use back burners when possible.  Clean up spills quickly and allow time for drying.  Avoid walking on wet floors.  Avoid hot utensils and knives.  Position shelves so they are not too high or low.  Place commonly used objects within easy reach.  If necessary, use a sturdy step stool with a grab bar when reaching.  Keep electrical cables out of the  way.  Do not use floor polish or wax that makes floors slippery. If you must use wax, use non-skid floor wax.  Remove throw rugs and tripping hazards from the floor. STAIRWAYS  Never leave objects on stairs.  Place handrails on both sides of stairways and use them. Fix any loose handrails. Make sure handrails on both sides of the stairways are as long as the stairs.  Check carpeting to make sure it is firmly attached along stairs. Make repairs to worn or loose carpet promptly.  Avoid placing throw rugs at the top or bottom of stairways, or properly secure the rug with carpet tape to prevent slippage. Get rid of throw rugs, if possible.  Have an electrician put in a light switch at the top and bottom of the stairs. OTHER FALL PREVENTION TIPS  Wear low-heel or rubber-soled shoes that are supportive and fit well. Wear closed toe shoes.  When using a stepladder, make sure it is fully opened and both spreaders are firmly locked. Do not climb a closed stepladder.  Add color or contrast paint or tape to grab bars and handrails in your home. Place contrasting color strips on first and last steps.  Learn and use mobility aids as needed. Install an electrical emergency response system.  Turn on lights to avoid dark areas. Replace light bulbs that burn out immediately. Get light switches that glow.  Arrange furniture to create clear pathways. Keep furniture in the same place.  Firmly attach carpet with non-skid or double-sided tape.  Eliminate uneven floor surfaces.  Select a carpet pattern that does not visually hide the edge of steps.  Be aware of all pets. OTHER HOME SAFETY TIPS  Set the water temperature for 120 F (48.8 C).  Keep emergency numbers on or near the telephone.  Keep smoke detectors on every level of the home and near sleeping areas. Document Released: 04/26/2002 Document Revised: 11/05/2011 Document Reviewed: 07/26/2011 [facility] Patient Information 2015  [place], [place]. This information is not intended to replace advice given to you by your health care provider. Make sure you discuss any questions you have with your health care provider.   Patient was evaluated in ED today. There does not appear to be any acute or emergent results from the presumed fall today. Please have patient follow-up with primary care within 3-5 days for further evaluation and/or management. Return to ED for worsening symptoms, headaches, numbness or weakness, chest pain, shortness of breath, abdominal pain

## 2014-04-22 NOTE — ED Notes (Signed)
[facility]itness[facility] bed-pt found sitting-no obvious injuries-has dementia-intermittent buttock and redness to both elbows

## 2014-05-02 ENCOUNTER — Emergency Department (HOSPITAL_COMMUNITY)
Admission: EM | Admit: 2014-05-02 | Discharge: 2014-05-02 | Disposition: A | Discharge status: Home / Self Care | Payer: Medicare Other | Attending: Emergency Medicine | Admitting: Emergency Medicine

## 2014-05-02 ENCOUNTER — Encounter (HOSPITAL_COMMUNITY): Payer: Self-pay | Admitting: Emergency Medicine

## 2014-05-02 DIAGNOSIS — F419 Anxiety disorder, unspecified: Secondary | ICD-10-CM | POA: Insufficient documentation

## 2014-05-02 DIAGNOSIS — I10 Essential (primary) hypertension: Secondary | ICD-10-CM | POA: Insufficient documentation

## 2014-05-02 DIAGNOSIS — Z87448 Personal history of other diseases of urinary system: Secondary | ICD-10-CM | POA: Insufficient documentation

## 2014-05-02 DIAGNOSIS — F0280 Dementia in other diseases classified elsewhere without behavioral disturbance: Secondary | ICD-10-CM | POA: Diagnosis not present

## 2014-05-02 DIAGNOSIS — Y9289 Other specified places as the place of occurrence of the external cause: Secondary | ICD-10-CM | POA: Insufficient documentation

## 2014-05-02 DIAGNOSIS — Z79899 Other long term (current) drug therapy: Secondary | ICD-10-CM | POA: Diagnosis not present

## 2014-05-02 DIAGNOSIS — Y998 Other external cause status: Secondary | ICD-10-CM | POA: Diagnosis not present

## 2014-05-02 DIAGNOSIS — Z23 Encounter for immunization: Secondary | ICD-10-CM | POA: Diagnosis not present

## 2014-05-02 DIAGNOSIS — S60511A Abrasion of right hand, initial encounter: Secondary | ICD-10-CM | POA: Diagnosis not present

## 2014-05-02 DIAGNOSIS — Y9389 Activity, other specified: Secondary | ICD-10-CM | POA: Insufficient documentation

## 2014-05-02 DIAGNOSIS — Z8719 Personal history of other diseases of the digestive system: Secondary | ICD-10-CM | POA: Insufficient documentation

## 2014-05-02 DIAGNOSIS — X58XXXA Exposure to other specified factors, initial encounter: Secondary | ICD-10-CM | POA: Diagnosis not present

## 2014-05-02 DIAGNOSIS — J449 Chronic obstructive pulmonary disease, unspecified: Secondary | ICD-10-CM | POA: Insufficient documentation

## 2014-05-02 DIAGNOSIS — G309 Alzheimer's disease, unspecified: Secondary | ICD-10-CM | POA: Insufficient documentation

## 2014-05-02 DIAGNOSIS — S6991XA Unspecified injury of right wrist, hand and finger(s), initial encounter: Secondary | ICD-10-CM | POA: Diagnosis present

## 2014-05-02 MED ORDER — TETANUS-DIPHTH-ACELL PERTUSSIS 5-2.5-18.5 LF-MCG/0.5 IM SUSP
0.5000 mL | Freq: Once | INTRAMUSCULAR | Status: AC
  Administered 2014-05-02: 0.5 mL via INTRAMUSCULAR
  Filled 2014-05-02: qty 0.5

## 2014-05-02 NOTE — Discharge Instructions (Signed)
You were given Tdap vaccine just as a measure of safety.     Abrasions An abrasion is a cut or scrape of the skin. Abrasions do not go through all layers of the skin. HOME CARE  If a bandage (dressing) was put on your wound, change it as told by your doctor. If the bandage sticks, soak it off with warm.  Wash the area with water and soap 2 times a day. Rinse off the soap. Pat the area dry with a clean towel.  Put on medicated cream (ointment) as told by your doctor.  Change your bandage right away if it gets wet or dirty.  Only take medicine as told by your doctor.  See your doctor within 24-48 hours to get your wound checked.  Check your wound for redness, puffiness (swelling), or yellowish-white fluid (pus). GET HELP RIGHT AWAY IF:   You have more pain in the wound.  You have redness, swelling, or tenderness around the wound.  You have pus coming from the wound.  You have a fever or lasting symptoms for more than 2-3 days.  You have a fever and your symptoms suddenly get worse.  You have a bad smell coming from the wound or bandage. MAKE SURE YOU:   Understand these instructions.  Will watch your condition.  Will get help right away if you are not doing well or get worse. Document Released: 10/23/2007 Document Revised: 01/29/2012 Document Reviewed: 04/09/2011 [facility] Patient Information 2015 [place], [place]. This information is not intended to replace advice given to you by your health care provider. Make sure you discuss any questions you have with your health care provider.

## 2014-05-02 NOTE — ED Notes (Signed)
Bed: [id number] Expected date:  Expected time:  Means of arrival:  Comments: EMS 80 yo with skin tear
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
## 2014-05-02 NOTE — ED Notes (Signed)
[facility] here to transport pt back to [facility]. 

## 2014-05-02 NOTE — Progress Notes (Signed)
CSW spoke with patient at bedside. There was no family present at bedside. Per note, Patient has a history of Alzheimer's dementia. The patient was confused during the interview when CSW attempted to get confirmation [facility] However, [facility]. The patient is extremely hard of hearing.   Trish MageBrittney Terell Kincy, LCSWA [phone] ED CSW 05/02/2014 10:14 PM

## 2014-06-07 ENCOUNTER — Emergency Department (HOSPITAL_COMMUNITY): Payer: Medicare Other

## 2014-06-07 ENCOUNTER — Emergency Department (HOSPITAL_COMMUNITY)
Admission: EM | Admit: 2014-06-07 | Discharge: 2014-06-07 | Disposition: A | Discharge status: Home / Self Care | Payer: Medicare Other | Attending: Emergency Medicine | Admitting: Emergency Medicine

## 2014-06-07 DIAGNOSIS — F0281 Dementia in other diseases classified elsewhere with behavioral disturbance: Secondary | ICD-10-CM | POA: Insufficient documentation

## 2014-06-07 DIAGNOSIS — G309 Alzheimer's disease, unspecified: Secondary | ICD-10-CM | POA: Diagnosis not present

## 2014-06-07 DIAGNOSIS — Z87448 Personal history of other diseases of urinary system: Secondary | ICD-10-CM | POA: Diagnosis not present

## 2014-06-07 DIAGNOSIS — J449 Chronic obstructive pulmonary disease, unspecified: Secondary | ICD-10-CM | POA: Insufficient documentation

## 2014-06-07 DIAGNOSIS — I10 Essential (primary) hypertension: Secondary | ICD-10-CM | POA: Diagnosis not present

## 2014-06-07 DIAGNOSIS — Z79899 Other long term (current) drug therapy: Secondary | ICD-10-CM | POA: Insufficient documentation

## 2014-06-07 DIAGNOSIS — Z792 Long term (current) use of antibiotics: Secondary | ICD-10-CM | POA: Diagnosis not present

## 2014-06-07 DIAGNOSIS — R079 Chest pain, unspecified: Secondary | ICD-10-CM | POA: Diagnosis not present

## 2014-06-07 LAB — BASIC METABOLIC PANEL
ANION GAP: 6 (ref 5–15)
BUN: 21 mg/dL (ref 6–23)
CALCIUM: 8.7 mg/dL (ref 8.4–10.5)
CO2: 27 mmol/L (ref 19–32)
Chloride: 109 mEq/L (ref 96–112)
Creatinine, Ser: 1.03 mg/dL (ref 0.50–1.10)
GFR calc Af Amer: 52 mL/min — ABNORMAL LOW (ref >90)
GFR calc non Af Amer: 45 mL/min — ABNORMAL LOW (ref >90)
Glucose, Bld: 89 mg/dL (ref 70–99)
POTASSIUM: 4.6 mmol/L (ref 3.5–5.1)
Sodium: 142 mmol/L (ref 135–145)

## 2014-06-07 LAB — TROPONIN I: Troponin I: <0.03 ng/mL (ref <0.031)

## 2014-06-07 LAB — CBC
HCT: 35.4 % — ABNORMAL LOW (ref 36.0–46.0)
HEMOGLOBIN: 11.4 g/dL — AB (ref 12.0–15.0)
MCH: 30.7 pg (ref 26.0–34.0)
MCHC: 32.2 g/dL (ref 30.0–36.0)
MCV: 95.4 fL (ref 78.0–100.0)
PLATELETS: 182 10*3/uL (ref 150–400)
RBC: 3.71 MIL/uL — ABNORMAL LOW (ref 3.87–5.11)
RDW: 13.5 % (ref 11.5–15.5)
WBC: 5.6 10*3/uL (ref 4.0–10.5)

## 2014-06-07 MED ORDER — ASPIRIN 325 MG PO TABS
325.0000 mg | ORAL_TABLET | ORAL | Status: AC
  Administered 2014-06-07: 325 mg via ORAL
  Filled 2014-06-07: qty 1

## 2014-06-07 NOTE — Discharge Instructions (Signed)

## 2014-06-07 NOTE — ED Notes (Signed)
Dr. Belfi at bedside 

## 2014-06-07 NOTE — ED Provider Notes (Signed)
CSN: [id number]     Arrival date & time 06/07/14  1157 History   First MD Initiated Contact with Patient 06/07/14 1208     Chief Complaint  Patient presents with  . Chest Pain     (Consider location/radiation/quality/duration/timing/severity/associated sxs/prior Treatment) HPI Comments: Patient with a history of COPD hypertension and dementia was sent over from a nursing facility with complaints of chest pain. She normally is confused at baseline. Reportedly she was complaining of some chest pain earlier at the nursing home. When I ask her about chest pain, she can't recall whether it hurts or not. History is very limited due to her dementia. There is no report of recent falls.  Patient is a 81 y.o. female presenting with chest pain.  Chest Pain   Past Medical History  Diagnosis Date  . GERD (gastroesophageal reflux disease)   . Anxiety   . Dementia   . Alzheimer disease   . COPD (chronic obstructive pulmonary disease)   . Hypertension   . Renal disorder    No past surgical history on file. No family history on file. History  Substance Use Topics  . Smoking status: Never Smoker   . Smokeless tobacco: Not on file  . Alcohol Use: No   OB History    No data available     Review of Systems  Unable to perform ROS: Dementia  Cardiovascular: Positive for chest pain.      Allergies  Review of patient's allergies indicates no known allergies.  Home Medications   Prior to Admission medications   Medication Sig Start Date End Date Taking? Authorizing Provider  acetaminophen (TYLENOL) 500 MG tablet Take 500 mg by mouth every 6 (six) hours as needed for mild pain.   Yes Historical Provider, MD  ALPRAZolam (XANAX) 0.25 MG tablet Take 0.25 mg by mouth every 6 (six) hours as needed for anxiety (agitation). Not to exceed 6 tablets in 24 hours   Yes Historical Provider, MD  alum & mag hydroxide-simeth (MAALOX/MYLANTA) 200-200-20 MG/5ML suspension Take 30 mLs by mouth every 6 (six)  hours as needed for indigestion or heartburn.   Yes Historical Provider, MD  bumetanide (BUMEX) 0.5 MG tablet Take 0.5 mg by mouth 2 (two) times a week. On Monday and Thursday   Yes Historical Provider, MD  citalopram (CELEXA) 10 MG tablet Take 10 mg by mouth daily.   Yes Historical Provider, MD  donepezil (ARICEPT) 10 MG tablet Take 10 mg by mouth at bedtime.   Yes Historical Provider, MD  ENSURE (ENSURE) Take 237 mLs by mouth 3 (three) times daily with meals.   Yes Historical Provider, MD  guaifenesin (ROBITUSSIN) 100 MG/5ML syrup Take 200 mg by mouth every 6 (six) hours as needed for cough (not to exceed 4 doses in 24 hours).   Yes Historical Provider, MD  lisinopril (PRINIVIL,ZESTRIL) 10 MG tablet Take 10 mg by mouth daily.   Yes Historical Provider, MD  loperamide (IMODIUM) 2 MG capsule Take 2 mg by mouth as needed for diarrhea or loose stools (not to exceed 8 doses in 24 hours).   Yes Historical Provider, MD  magnesium hydroxide (MILK OF MAGNESIA) 400 MG/5ML suspension Take 30 mLs by mouth at bedtime as needed for mild constipation.   Yes Historical Provider, MD  mirtazapine (REMERON) 30 MG tablet Take 30 mg by mouth at bedtime.   Yes Historical Provider, MD  neomycin-bacitracin-polymyxin (NEOSPORIN) [phone] ointment Apply 1 application topically as directed. After cleaning with normal saline, cover with bandage  or gauze and secure with tape. Change as needed until healed for skin tears, abrasions or minor irritations.   Yes Historical Provider, MD  potassium chloride (K-DUR,KLOR-CON) 10 MEQ tablet Take 10 mEq by mouth daily.   Yes Historical Provider, MD  risperiDONE (RISPERDAL) 0.25 MG tablet Take 0.25 mg by mouth at bedtime. 09/02/12  Yes Hurman Horn, MD  Vitamin D, Ergocalciferol, (DRISDOL) 50000 UNITS CAPS capsule Take 50,000 Units by mouth every Thursday.   Yes Historical Provider, MD  cephALEXin (KEFLEX) 500 MG capsule Take 1 capsule (500 mg total) by mouth 4 (four) times  daily. Patient not taking: Reported on 04/13/2014 03/09/14   Arthor Captain, PA-C   BP 166/58 mmHg  Pulse 59  Resp 16  SpO2 100% Physical Exam  Constitutional: She appears well-developed and well-nourished.  HENT:  Head: Normocephalic and atraumatic.  Eyes: Pupils are equal, round, and reactive to light.  Neck: Normal range of motion. Neck supple.  Cardiovascular: Normal rate, regular rhythm and normal heart sounds.   Pulmonary/Chest: Effort normal and breath sounds normal. No respiratory distress. She has no wheezes. She has no rales. She exhibits no tenderness.  No signs of external trauma to the chest wall  Abdominal: Soft. Bowel sounds are normal. There is no tenderness. There is no rebound and no guarding.  Musculoskeletal: Normal range of motion. She exhibits no edema.  Lymphadenopathy:    She has no cervical adenopathy.  Neurological: She is alert.  Alert but confused  Skin: Skin is warm and dry. No rash noted.  Psychiatric: She has a normal mood and affect.    ED Course  Procedures (including critical care time) Labs Review Labs Reviewed  CBC - Abnormal; Notable for the following:    RBC 3.71 (*)    Hemoglobin 11.4 (*)    HCT 35.4 (*)    All other components within normal limits  BASIC METABOLIC PANEL - Abnormal; Notable for the following:    GFR calc non Af Amer 45 (*)    GFR calc Af Amer 52 (*)    All other components within normal limits  TROPONIN I  TROPONIN I    Imaging Review Dg Chest Port 1 View  06/07/2014   CLINICAL DATA:  Chest pain.  EXAM: PORTABLE CHEST - 1 VIEW  COMPARISON:  09/02/2012 and 06/01/2009 and CT 03/09/2014  FINDINGS: The lungs are adequately inflated without consolidation or effusion. Cardiomediastinal silhouette is within normal. There are several old left posterior rib fractures. Evidence of a moderate compression deformity of L1 unchanged.  IMPRESSION: No acute cardiopulmonary.  Stable moderate L1 compression fracture.   Electronically  Signed   By: Elberta Fortis M.D.   On: 06/07/2014 13:14     EKG Interpretation   Date/Time:  Tuesday June 07 2014 14:57:00 EST Ventricular Rate:  64 PR Interval:  66 QRS Duration: 89 QT Interval:  497 QTC Calculation: 513 R Axis:   7 Text Interpretation:  Sinus rhythm Short PR interval Minimal ST elevation,  anterior leads Prolonged QT interval Artifact in lead(s) I II aVR aVL  similar to EKG from Nov 2014 Reconfirmed by Goldman Birchall  MD, Shawna Orleans [phone]) on  06/07/2014 3:09:03 PM      MDM   Final diagnoses:  Chest pain, unspecified chest pain type    Patient has an EKG 2 in the ED today but does not appear changed significantly from her prior EKGs. She's had delta troponin that have been negative. She has not been complaining of any  ongoing chest pain. She was discharged back to the nursing home.    Rolan BuccoMelanie Yesica Kemler, MD 06/07/14 [phone]

## 2014-06-07 NOTE — ED Notes (Signed)
[facility], per EMS report staff reports the pt c/o mid non radiating CP onset today, denies SOB, n/v/d, per EMS pt is at baseline with garbled speech, hx of dementia, alert to person & place, disoriented to time & event

## 2014-06-07 NOTE — ED Notes (Signed)
Called [facility] 

## 2014-06-07 NOTE — ED Notes (Addendum)
This nurse attempted to re-adjust the pts blood pressure cuff. The pt would not allow the cuff to be moved. Pt is more combative than she was earlier in the shift.

## 2014-06-08 ENCOUNTER — Encounter (HOSPITAL_COMMUNITY): Payer: Self-pay | Admitting: Emergency Medicine

## 2014-06-08 ENCOUNTER — Emergency Department (HOSPITAL_COMMUNITY)
Admission: EM | Admit: 2014-06-08 | Discharge: 2014-06-08 | Disposition: A | Discharge status: Home / Self Care | Payer: Medicare Other | Attending: Emergency Medicine | Admitting: Emergency Medicine

## 2014-06-08 ENCOUNTER — Emergency Department (HOSPITAL_COMMUNITY): Payer: Medicare Other

## 2014-06-08 DIAGNOSIS — Z8719 Personal history of other diseases of the digestive system: Secondary | ICD-10-CM | POA: Insufficient documentation

## 2014-06-08 DIAGNOSIS — I10 Essential (primary) hypertension: Secondary | ICD-10-CM | POA: Diagnosis not present

## 2014-06-08 DIAGNOSIS — Y9289 Other specified places as the place of occurrence of the external cause: Secondary | ICD-10-CM | POA: Insufficient documentation

## 2014-06-08 DIAGNOSIS — J449 Chronic obstructive pulmonary disease, unspecified: Secondary | ICD-10-CM | POA: Diagnosis not present

## 2014-06-08 DIAGNOSIS — Z79899 Other long term (current) drug therapy: Secondary | ICD-10-CM | POA: Diagnosis not present

## 2014-06-08 DIAGNOSIS — S50312A Abrasion of left elbow, initial encounter: Secondary | ICD-10-CM | POA: Insufficient documentation

## 2014-06-08 DIAGNOSIS — F329 Major depressive disorder, single episode, unspecified: Secondary | ICD-10-CM | POA: Diagnosis not present

## 2014-06-08 DIAGNOSIS — Z87448 Personal history of other diseases of urinary system: Secondary | ICD-10-CM | POA: Diagnosis not present

## 2014-06-08 DIAGNOSIS — F0280 Dementia in other diseases classified elsewhere without behavioral disturbance: Secondary | ICD-10-CM | POA: Insufficient documentation

## 2014-06-08 DIAGNOSIS — Y9389 Activity, other specified: Secondary | ICD-10-CM | POA: Diagnosis not present

## 2014-06-08 DIAGNOSIS — Y998 Other external cause status: Secondary | ICD-10-CM | POA: Insufficient documentation

## 2014-06-08 DIAGNOSIS — W19XXXA Unspecified fall, initial encounter: Secondary | ICD-10-CM | POA: Diagnosis not present

## 2014-06-08 DIAGNOSIS — S51012A Laceration without foreign body of left elbow, initial encounter: Secondary | ICD-10-CM

## 2014-06-08 DIAGNOSIS — G309 Alzheimer's disease, unspecified: Secondary | ICD-10-CM | POA: Insufficient documentation

## 2014-06-08 DIAGNOSIS — S59902A Unspecified injury of left elbow, initial encounter: Secondary | ICD-10-CM | POA: Diagnosis present

## 2014-06-08 NOTE — Discharge Instructions (Signed)
Dressing changes with bacitracin once daily.  Return to the emergency department for signs or symptoms of infection, including redness, pus straining from the wound, or increasing pain.   Skin Tear Care A skin tear is a wound in which the top layer of skin has peeled off. This is a common problem with aging because the skin becomes thinner and more fragile as a person gets older. In addition, some medicines, such as oral corticosteroids, can lead to skin thinning if taken for long periods of time.  A skin tear is often repaired with tape or skin adhesive strips. This keeps the skin that has been peeled off in contact with the healthier skin beneath. Depending on the location of the wound, a bandage (dressing) may be applied over the tape or skin adhesive strips. Sometimes, during the healing process, the skin turns black and dies. Even when this happens, the torn skin acts as a good dressing until the skin underneath gets healthier and repairs itself. HOME CARE INSTRUCTIONS   Change dressings once per day or as directed by your caregiver.  Gently clean the skin tear and the area around the tear using saline solution or mild soap and water.  Do not rub the injured skin dry. Let the area air dry.  Apply petroleum jelly or an antibiotic cream or ointment to keep the tear moist. This will help the wound heal. Do not allow a scab to form.  If the dressing sticks before the next dressing change, moisten it with warm soapy water and gently remove it.  Protect the injured skin until it has healed.  Only take over-the-counter or prescription medicines as directed by your caregiver.  Take showers or baths using warm soapy water. Apply a new dressing after the shower or bath.  Keep all follow-up appointments as directed by your caregiver.  SEEK IMMEDIATE MEDICAL CARE IF:   You have redness, swelling, or increasing pain in the skin tear.  You havepus coming from the skin tear.  You have  chills.  You have a red streak that goes away from the skin tear.  You have a bad smell coming from the tear or dressing.  You have a fever or persistent symptoms for more than 2-3 days.  You have a fever and your symptoms suddenly get worse. MAKE SURE YOU:  Understand these instructions.  Will watch this condition.  Will get help right away if your child is not doing well or gets worse. Document Released: 01/29/2001 Document Revised: 01/29/2012 Document Reviewed: 11/18/2011 [facility] Patient Information 2015 [place], [place]. This information is not intended to replace advice given to you by your health care provider. Make sure you discuss any questions you have with your health care provider.

## 2014-06-08 NOTE — ED Notes (Addendum)
[facility] found lying beside her bed with hematoma to her L elbow. Pt denies pain, hx Alzheimer's and dementia. Pt alert, oriented to baseline, per staff, [facility]. No head injury noted, and pt is not on any blood thinners.

## 2014-06-08 NOTE — ED Notes (Signed)
Bed: WA25 Expected date:  Expected time:  Means of arrival:  Comments: EMS fall 

## 2014-06-08 NOTE — ED Provider Notes (Signed)
CSN: [id number]     Arrival date & time 06/08/14  [id number] History   First MD Initiated Contact with Patient 06/08/14 0740     Chief Complaint  Patient presents with  . Fall     (Consider location/radiation/quality/duration/timing/severity/associated sxs/prior Treatment) HPI Comments: Patient is a 81 year old female with history of dementia. She is currently a resident of [facility]. She is brought by EMS this morning after a fall. She is found lying beside her bed with bleeding from her left elbow.  Patient adds little to history due to dementia.  Patient is a 81 y.o. female presenting with fall. The history is provided by the patient and the EMS personnel.  Fall This is a new problem. The current episode started 1 to 2 hours ago. The problem occurs constantly. The problem has not changed since onset.Pertinent negatives include no chest pain. Nothing aggravates the symptoms. Nothing relieves the symptoms.    Past Medical History  Diagnosis Date  . GERD (gastroesophageal reflux disease)   . Anxiety   . Dementia   . Alzheimer disease   . COPD (chronic obstructive pulmonary disease)   . Hypertension   . Renal disorder    History reviewed. No pertinent past surgical history. History reviewed. No pertinent family history. History  Substance Use Topics  . Smoking status: Never Smoker   . Smokeless tobacco: Not on file  . Alcohol Use: No   OB History    No data available     Review of Systems  Unable to perform ROS Cardiovascular: Negative for chest pain.      Allergies  Review of patient's allergies indicates no known allergies.  Home Medications   Prior to Admission medications   Medication Sig Start Date End Date Taking? Authorizing Provider  acetaminophen (TYLENOL) 500 MG tablet Take 500 mg by mouth every 6 (six) hours as needed for mild pain.   Yes Historical Provider, MD  ALPRAZolam (XANAX) 0.25 MG tablet Take 0.25 mg by mouth every 6  (six) hours as needed for anxiety (agitation). Not to exceed 6 tablets in 24 hours   Yes Historical Provider, MD  alum & mag hydroxide-simeth (MAALOX/MYLANTA) 200-200-20 MG/5ML suspension Take 30 mLs by mouth every 6 (six) hours as needed for indigestion or heartburn.   Yes Historical Provider, MD  bumetanide (BUMEX) 0.5 MG tablet Take 0.5 mg by mouth 2 (two) times a week. On Monday and Thursday   Yes Historical Provider, MD  citalopram (CELEXA) 10 MG tablet Take 10 mg by mouth daily.   Yes Historical Provider, MD  donepezil (ARICEPT) 10 MG tablet Take 10 mg by mouth at bedtime.   Yes Historical Provider, MD  ENSURE (ENSURE) Take 237 mLs by mouth 3 (three) times daily with meals.   Yes Historical Provider, MD  guaifenesin (ROBITUSSIN) 100 MG/5ML syrup Take 200 mg by mouth every 6 (six) hours as needed for cough (not to exceed 4 doses in 24 hours).   Yes Historical Provider, MD  lisinopril (PRINIVIL,ZESTRIL) 10 MG tablet Take 10 mg by mouth daily.   Yes Historical Provider, MD  loperamide (IMODIUM) 2 MG capsule Take 2 mg by mouth as needed for diarrhea or loose stools (not to exceed 8 doses in 24 hours).   Yes Historical Provider, MD  mirtazapine (REMERON) 30 MG tablet Take 30 mg by mouth at bedtime.   Yes Historical Provider, MD  neomycin-bacitracin-polymyxin (NEOSPORIN) [phone] ointment Apply 1 application topically as directed. After cleaning with normal saline, cover  with bandage or gauze and secure with tape. Change as needed until healed for skin tears, abrasions or minor irritations.   Yes Historical Provider, MD  potassium chloride (K-DUR,KLOR-CON) 10 MEQ tablet Take 10 mEq by mouth daily.   Yes Historical Provider, MD  Vitamin D, Ergocalciferol, (DRISDOL) 50000 UNITS CAPS capsule Take 50,000 Units by mouth every Thursday.   Yes Historical Provider, MD  cephALEXin (KEFLEX) 500 MG capsule Take 1 capsule (500 mg total) by mouth 4 (four) times daily. Patient not taking: Reported on 04/13/2014  03/09/14   Arthor Captain, PA-C   BP 196/63 mmHg  Pulse 69  Temp(Src) 98 F (36.7 C) (Oral)  Resp 16  Wt 100 lb (45.36 kg)  SpO2 99% Physical Exam  Constitutional: She is oriented to person, place, and time. She appears well-developed and well-nourished.  Patient is a thin, elderly female in no acute distress. She is pleasantly confused. She is alert and awake, however not oriented to person, place, time, or situation.  HENT:  Head: Normocephalic and atraumatic.  Mouth/Throat: Oropharynx is clear and moist.  Eyes: EOM are normal. Pupils are equal, round, and reactive to light.  Neck: Normal range of motion. Neck supple.  There is no cervical spine tenderness or step-off. She moves her head in all directions without any visible discomfort.  Cardiovascular: Normal rate, regular rhythm and intact distal pulses.   No murmur heard. Pulmonary/Chest: Effort normal and breath sounds normal. No respiratory distress. She has no wheezes.  Abdominal: Soft. Bowel sounds are normal. She exhibits no distension.  Musculoskeletal: Normal range of motion.  The left elbow is noted to have a 2 cm skin tear. Bleeding is controlled. There is mild swelling to the olecranon, however otherwise there is no obvious deformity. She has full range of motion without apparent discomfort. Distal ulnar and radial pulses are palpable. She is able to flex, extend, and oppose all fingers.  Neurological: She is alert and oriented to person, place, and time.  Skin: Skin is warm and dry.  Nursing note and vitals reviewed.   ED Course  Procedures (including critical care time) Labs Review Labs Reviewed - No data to display  Imaging Review Dg Chest [facility] 1 View  06/07/2014   CLINICAL DATA:  Chest pain.  EXAM: PORTABLE CHEST - 1 VIEW  COMPARISON:  09/02/2012 and 06/01/2009 and CT 03/09/2014  FINDINGS: The lungs are adequately inflated without consolidation or effusion. Cardiomediastinal silhouette is within normal. There  are several old left posterior rib fractures. Evidence of a moderate compression deformity of L1 unchanged.  IMPRESSION: No acute cardiopulmonary.  Stable moderate L1 compression fracture.   Electronically Signed   By: Elberta Fortis M.D.   On: 06/07/2014 13:14     EKG Interpretation None      MDM   Final diagnoses:  Fall    Patient with history of dementia sent from dementia care unit for evaluation of a fall. She has a skin tear to her left elbow. Bleeding is controlled and x-rays are negative for fracture. The surrounding tissue is extremely thin and I do not feel will hold a suture or staple. She will be placed in a bacitracin dressing and returned to her present living environment.    Geoffery Lyons, MD 06/08/14 [phone]

## 2014-06-08 NOTE — ED Notes (Signed)
Steri strips placed on left elbow. Gauze and kerlix placed. Dressing clean dry and intact.

## 2014-06-10 ENCOUNTER — Encounter (HOSPITAL_COMMUNITY): Payer: Self-pay | Admitting: Emergency Medicine

## 2014-06-10 ENCOUNTER — Emergency Department (HOSPITAL_COMMUNITY)
Admission: EM | Admit: 2014-06-10 | Discharge: 2014-06-10 | Disposition: A | Discharge status: Home / Self Care | Payer: Medicare Other | Attending: Emergency Medicine | Admitting: Emergency Medicine

## 2014-06-10 DIAGNOSIS — G309 Alzheimer's disease, unspecified: Secondary | ICD-10-CM | POA: Diagnosis not present

## 2014-06-10 DIAGNOSIS — I10 Essential (primary) hypertension: Secondary | ICD-10-CM | POA: Diagnosis not present

## 2014-06-10 DIAGNOSIS — Y92129 Unspecified place in nursing home as the place of occurrence of the external cause: Secondary | ICD-10-CM | POA: Insufficient documentation

## 2014-06-10 DIAGNOSIS — Y998 Other external cause status: Secondary | ICD-10-CM | POA: Insufficient documentation

## 2014-06-10 DIAGNOSIS — W19XXXA Unspecified fall, initial encounter: Secondary | ICD-10-CM

## 2014-06-10 DIAGNOSIS — W1839XA Other fall on same level, initial encounter: Secondary | ICD-10-CM | POA: Diagnosis not present

## 2014-06-10 DIAGNOSIS — Z87448 Personal history of other diseases of urinary system: Secondary | ICD-10-CM | POA: Diagnosis not present

## 2014-06-10 DIAGNOSIS — F0390 Unspecified dementia without behavioral disturbance: Secondary | ICD-10-CM

## 2014-06-10 DIAGNOSIS — J449 Chronic obstructive pulmonary disease, unspecified: Secondary | ICD-10-CM | POA: Insufficient documentation

## 2014-06-10 DIAGNOSIS — K219 Gastro-esophageal reflux disease without esophagitis: Secondary | ICD-10-CM | POA: Insufficient documentation

## 2014-06-10 DIAGNOSIS — Z043 Encounter for examination and observation following other accident: Secondary | ICD-10-CM | POA: Diagnosis present

## 2014-06-10 DIAGNOSIS — Y9301 Activity, walking, marching and hiking: Secondary | ICD-10-CM | POA: Diagnosis not present

## 2014-06-10 DIAGNOSIS — F0281 Dementia in other diseases classified elsewhere with behavioral disturbance: Secondary | ICD-10-CM | POA: Insufficient documentation

## 2014-06-10 DIAGNOSIS — Z79899 Other long term (current) drug therapy: Secondary | ICD-10-CM | POA: Diagnosis not present

## 2014-06-10 DIAGNOSIS — F419 Anxiety disorder, unspecified: Secondary | ICD-10-CM | POA: Diagnosis not present

## 2014-06-10 LAB — URINALYSIS, ROUTINE W REFLEX MICROSCOPIC
BILIRUBIN URINE: NEGATIVE
GLUCOSE, UA: NEGATIVE mg/dL
Hgb urine dipstick: NEGATIVE
Ketones, ur: NEGATIVE mg/dL
Leukocytes, UA: NEGATIVE
Nitrite: NEGATIVE
Protein, ur: NEGATIVE mg/dL
Specific Gravity, Urine: 1.027 (ref 1.005–1.030)
Urobilinogen, UA: 0.2 mg/dL (ref 0.0–1.0)
pH: 5 (ref 5.0–8.0)

## 2014-06-10 NOTE — ED Notes (Signed)
Report called to Ms. Tharon AquasGeraldine Posadas of receiving facility.

## 2014-06-10 NOTE — ED Notes (Signed)
Pt. Ready for discharge PT[facility] called for transport.

## 2014-06-10 NOTE — ED Notes (Signed)
Per EMS , [facility] complaint of fall at 7pm this evening while waking in the hallway with her walker , no LOC reported, pt. Has dementia , been reported of increasing falls within this week. On RA , no s/s of distress reported.

## 2014-06-10 NOTE — Progress Notes (Signed)
  CARE MANAGEMENT ED NOTE 06/10/2014  Patient:  [name],[name]   Account Number:  [id number]  Date Initiated:  06/10/2014  Documentation initiated by:  Radford PaxFERRERO,Zoe Creasman  Subjective/Objective Assessment:   Patient presents to Ed post fall at nursing facility     Subjective/Objective Assessment Detail:   [facility]  Patient with pmhx of Alzheimer's disease.    Patient noted to have visited the ED 7 times within the last six months with frequent falls.     Action/Plan:   Action/Plan Detail:   Anticipated DC Date:  06/10/2014     Status Recommendation to Physician:   Result of Recommendation:    Other ED Services  Consult Working Plan    DC Planning Services  Other  PCP issues    Choice offered to / List presented to:            Status of service:  Completed, signed off  ED Comments:   ED Comments Detail:  EDCM spoke topatient at bedisde with EDSW.  Patient awake, alert, pleasant, oriented to self only.  [facility]  "I don't know where I live."  Patient unable to tell EDCM month, day of the week or time of day.  Patient requires she needs assistance with ADL's.  Patient to be transferred back to nursing facility.  PCP listed on file is Dr. Ron ParkerSamuel Ramos. No further EDCM needs at this time.

## 2014-06-10 NOTE — ED Provider Notes (Signed)
CSN: [id number]     Arrival date & time 06/10/14  2040 History   First MD Initiated Contact with Patient 06/10/14 2042     Chief Complaint  Patient presents with  . Fall     (Consider location/radiation/quality/duration/timing/severity/associated sxs/prior Treatment) HPI  A LEVEL 5 CAVEAT PERTAINS DUE TO DEMENTIA Per nursing home and EMS report patient has had 3 falls this week.  Nursing home is concerned about UTI and would like UA to be checked.  Pt had fall this evening at 7pm while walking with her walker.  No LOC.  No head injury.  Pt is otherwise at her baseline.  She has no complaints of pain.    Past Medical History  Diagnosis Date  . GERD (gastroesophageal reflux disease)   . Anxiety   . Dementia   . Alzheimer disease   . COPD (chronic obstructive pulmonary disease)   . Hypertension   . Renal disorder    History reviewed. No pertinent past surgical history. History reviewed. No pertinent family history. History  Substance Use Topics  . Smoking status: Never Smoker   . Smokeless tobacco: Not on file  . Alcohol Use: No   OB History    No data available     Review of Systems  UNABLE TO OBTAIN ROS DUE TO LEVEL 5 CAVEAT    Allergies  Review of patient's allergies indicates no known allergies.  Home Medications   Prior to Admission medications   Medication Sig Start Date End Date Taking? Authorizing Provider  ALPRAZolam (XANAX) 0.25 MG tablet Take 0.25 mg by mouth every 6 (six) hours as needed for anxiety (agitation). Not to exceed 6 tablets in 24 hours   Yes Historical Provider, MD  bumetanide (BUMEX) 0.5 MG tablet Take 0.5 mg by mouth 2 (two) times a week. On Monday and Thursday   Yes Historical Provider, MD  citalopram (CELEXA) 10 MG tablet Take 10 mg by mouth daily.   Yes Historical Provider, MD  donepezil (ARICEPT) 10 MG tablet Take 10 mg by mouth at bedtime.   Yes Historical Provider, MD  ENSURE (ENSURE) Take 237 mLs by mouth 3 (three) times daily with  meals.   Yes Historical Provider, MD  lisinopril (PRINIVIL,ZESTRIL) 10 MG tablet Take 10 mg by mouth daily.   Yes Historical Provider, MD  mirtazapine (REMERON) 30 MG tablet Take 30 mg by mouth at bedtime.   Yes Historical Provider, MD  potassium chloride (K-DUR,KLOR-CON) 10 MEQ tablet Take 10 mEq by mouth daily.   Yes Historical Provider, MD  risperiDONE (RISPERDAL) 0.25 MG tablet Take 0.25 mg by mouth at bedtime.   Yes Historical Provider, MD  Vitamin D, Ergocalciferol, (DRISDOL) 50000 UNITS CAPS capsule Take 50,000 Units by mouth every Thursday.   Yes Historical Provider, MD  acetaminophen (TYLENOL) 500 MG tablet Take 500 mg by mouth every 6 (six) hours as needed for mild pain.    Historical Provider, MD  alum & mag hydroxide-simeth (MAALOX/MYLANTA) 200-200-20 MG/5ML suspension Take 30 mLs by mouth every 6 (six) hours as needed for indigestion or heartburn.    Historical Provider, MD  cephALEXin (KEFLEX) 500 MG capsule Take 1 capsule (500 mg total) by mouth 4 (four) times daily. Patient not taking: Reported on 04/13/2014 03/09/14   Arthor CaptainAbigail Harris, PA-C  guaifenesin (ROBITUSSIN) 100 MG/5ML syrup Take 200 mg by mouth every 6 (six) hours as needed for cough (not to exceed 4 doses in 24 hours).    Historical Provider, MD  loperamide (IMODIUM) 2 MG  capsule Take 2 mg by mouth as needed for diarrhea or loose stools (not to exceed 8 doses in 24 hours).    Historical Provider, MD  neomycin-bacitracin-polymyxin (NEOSPORIN) [phone] ointment Apply 1 application topically as directed. After cleaning with normal saline, cover with bandage or gauze and secure with tape. Change as needed until healed for skin tears, abrasions or minor irritations.    Historical Provider, MD   BP 138/52 mmHg  Pulse 70  Temp(Src) 98.8 F (37.1 C) (Oral)  Resp 17  SpO2 98%  Vitals reviewed Physical Exam  Physical Examination: General appearance - alert, well appearing, and in no distress Mental status - alert, oriented to  person not to time or place Eyes - pupils equal and reactive, extraocular eye movements intact Head- NCAT Neck - no midline tenderness to palpation Chest - clear to auscultation, no wheezes, rales or rhonchi, symmetric air entry Heart - normal rate, regular rhythm, normal S1, S2, no murmurs, rubs, clicks or gallops Abdomen - soft, nontender, nondistended, no masses or organomegaly Back exam - no midline tenderness to palpation, no CVA tenderness Neurological - alert, oriented x 1, moving all extremities, not able to cooperate with remainder of neuro exam Musculoskeletal - no joint tenderness, deformity or swelling, no bony point tenderness Extremities - peripheral pulses normal, no pedal edema, no clubbing or cyanosis Skin - normal coloration and turgor, no rashes, no bruising  ED Course  Procedures (including critical care time) Labs Review Labs Reviewed  URINALYSIS, ROUTINE W REFLEX MICROSCOPIC    Imaging Review No results found.   EKG Interpretation None      MDM   Final diagnoses:  Fall, initial encounter  Dementia, without behavioral disturbance    Pt presenting with c/o mechanical fall tonight while walker with walker.  No current complaints -no findings of new trauma on exam.  Urinalysis reassuring.  Discharged with strict return precautions.  Pt agreeable with plan.    Ethelda Chick, MD 06/10/14 2236

## 2014-06-10 NOTE — ED Notes (Signed)
Bed: ZO10WA05 Expected date:  Expected time:  Means of arrival:  Comments: 40F increased falls

## 2014-06-10 NOTE — Discharge Instructions (Signed)
Return to the ED with any concerns including vomiting, seizure activity, decreased mental status, or any other alarming symptoms

## 2014-06-10 NOTE — Progress Notes (Signed)
CSW met with patient at bedside with Nurse CM. Per notes, patient is from [facility] and presents to the ED due to c/o falling. Patient was not able to verbally confirm this information. Patient stated that she did not remember where she lives or if she fell today. However, patient was able to tell CSW that she needs assistance completing her ADL's.  Willette Brace [phone] ED CSW 06/10/2014 9:46 PM

## 2014-06-17 ENCOUNTER — Emergency Department (HOSPITAL_COMMUNITY)
Admission: EM | Admit: 2014-06-17 | Discharge: 2014-06-17 | Disposition: A | Discharge status: Home / Self Care | Payer: Medicare Other | Attending: Emergency Medicine | Admitting: Emergency Medicine

## 2014-06-17 ENCOUNTER — Encounter (HOSPITAL_COMMUNITY): Payer: Self-pay | Admitting: Emergency Medicine

## 2014-06-17 ENCOUNTER — Emergency Department (HOSPITAL_COMMUNITY): Payer: Medicare Other

## 2014-06-17 DIAGNOSIS — Z8719 Personal history of other diseases of the digestive system: Secondary | ICD-10-CM | POA: Diagnosis not present

## 2014-06-17 DIAGNOSIS — G309 Alzheimer's disease, unspecified: Secondary | ICD-10-CM | POA: Diagnosis not present

## 2014-06-17 DIAGNOSIS — Z792 Long term (current) use of antibiotics: Secondary | ICD-10-CM | POA: Diagnosis not present

## 2014-06-17 DIAGNOSIS — Z8742 Personal history of other diseases of the female genital tract: Secondary | ICD-10-CM | POA: Diagnosis not present

## 2014-06-17 DIAGNOSIS — R4182 Altered mental status, unspecified: Secondary | ICD-10-CM | POA: Diagnosis present

## 2014-06-17 DIAGNOSIS — Z79899 Other long term (current) drug therapy: Secondary | ICD-10-CM | POA: Insufficient documentation

## 2014-06-17 DIAGNOSIS — F419 Anxiety disorder, unspecified: Secondary | ICD-10-CM | POA: Diagnosis not present

## 2014-06-17 DIAGNOSIS — I10 Essential (primary) hypertension: Secondary | ICD-10-CM | POA: Diagnosis not present

## 2014-06-17 DIAGNOSIS — F0280 Dementia in other diseases classified elsewhere without behavioral disturbance: Secondary | ICD-10-CM | POA: Insufficient documentation

## 2014-06-17 DIAGNOSIS — R4689 Other symptoms and signs involving appearance and behavior: Secondary | ICD-10-CM

## 2014-06-17 DIAGNOSIS — F919 Conduct disorder, unspecified: Secondary | ICD-10-CM | POA: Insufficient documentation

## 2014-06-17 DIAGNOSIS — J3489 Other specified disorders of nose and nasal sinuses: Secondary | ICD-10-CM | POA: Diagnosis not present

## 2014-06-17 DIAGNOSIS — J449 Chronic obstructive pulmonary disease, unspecified: Secondary | ICD-10-CM | POA: Diagnosis not present

## 2014-06-17 LAB — URINALYSIS, ROUTINE W REFLEX MICROSCOPIC
BILIRUBIN URINE: NEGATIVE
Glucose, UA: NEGATIVE mg/dL
Hgb urine dipstick: NEGATIVE
KETONES UR: NEGATIVE mg/dL
LEUKOCYTES UA: NEGATIVE
Nitrite: NEGATIVE
Protein, ur: NEGATIVE mg/dL
Specific Gravity, Urine: 1.018 (ref 1.005–1.030)
UROBILINOGEN UA: 0.2 mg/dL (ref 0.0–1.0)
pH: 7 (ref 5.0–8.0)

## 2014-06-17 LAB — CBC WITH DIFFERENTIAL/PLATELET
BASOS ABS: 0 10*3/uL (ref 0.0–0.1)
BASOS PCT: 1 % (ref 0–1)
EOS PCT: 2 % (ref 0–5)
Eosinophils Absolute: 0.2 10*3/uL (ref 0.0–0.7)
HCT: 39.2 % (ref 36.0–46.0)
Hemoglobin: 12.7 g/dL (ref 12.0–15.0)
LYMPHS ABS: 1.3 10*3/uL (ref 0.7–4.0)
Lymphocytes Relative: 16 % (ref 12–46)
MCH: 30.9 pg (ref 26.0–34.0)
MCHC: 32.4 g/dL (ref 30.0–36.0)
MCV: 95.4 fL (ref 78.0–100.0)
MONO ABS: 0.9 10*3/uL (ref 0.1–1.0)
Monocytes Relative: 11 % (ref 3–12)
NEUTROS ABS: 5.7 10*3/uL (ref 1.7–7.7)
NEUTROS PCT: 70 % (ref 43–77)
PLATELETS: 240 10*3/uL (ref 150–400)
RBC: 4.11 MIL/uL (ref 3.87–5.11)
RDW: 13.1 % (ref 11.5–15.5)
WBC: 8.1 10*3/uL (ref 4.0–10.5)

## 2014-06-17 LAB — COMPREHENSIVE METABOLIC PANEL
ALK PHOS: 77 U/L (ref 39–117)
ALT: 12 U/L (ref 0–35)
AST: 21 U/L (ref 0–37)
Albumin: 3.7 g/dL (ref 3.5–5.2)
Anion gap: 6 (ref 5–15)
BUN: 20 mg/dL (ref 6–23)
CO2: 29 mmol/L (ref 19–32)
CREATININE: 1.07 mg/dL (ref 0.50–1.10)
Calcium: 8.9 mg/dL (ref 8.4–10.5)
Chloride: 107 mmol/L (ref 96–112)
GFR calc non Af Amer: 43 mL/min — ABNORMAL LOW (ref >90)
GFR, EST AFRICAN AMERICAN: 50 mL/min — AB (ref >90)
Glucose, Bld: 76 mg/dL (ref 70–99)
POTASSIUM: 4.5 mmol/L (ref 3.5–5.1)
Sodium: 142 mmol/L (ref 135–145)
Total Bilirubin: 0.4 mg/dL (ref 0.3–1.2)
Total Protein: 7 g/dL (ref 6.0–8.3)

## 2014-06-17 MED ORDER — SODIUM CHLORIDE 0.9 % IV BOLUS (SEPSIS)
500.0000 mL | Freq: Once | INTRAVENOUS | Status: AC
  Administered 2014-06-17: 500 mL via INTRAVENOUS

## 2014-06-17 NOTE — ED Notes (Signed)
Bed: WA18 Expected date:  Expected time:  Means of arrival:  Comments: [facility] 

## 2014-06-17 NOTE — ED Notes (Signed)
Attempt to do in and out on patient and patient had no urine in bladder.  Nurse was in the room with me when I attempt.

## 2014-06-17 NOTE — ED Notes (Signed)
Per [facility], hx of dementia oriented to name only. Pt was recently diagnosed with bronchitis. Facility staff have tried to get a chest x ray with no success. Pt has become acutely aggitated over past 24 hours. Pt calm on arrival. PTAR reports pt was biting, kicking, scratching and hitting when ever she was touched. Facility reports pt had fever of 103.9 yesterday which resolved with tylenol.

## 2014-06-17 NOTE — ED Provider Notes (Addendum)
CSN: [id number]     Arrival date & time 06/17/14  1156 History   First MD Initiated Contact with Patient 06/17/14 1213     Chief Complaint  Patient presents with  . Altered Mental Status  . Aggressive Behavior     (Consider location/radiation/quality/duration/timing/severity/associated sxs/prior Treatment) Patient is a 81 y.o. female presenting with altered mental status. The history is provided by the patient.  Altered Mental Status level 5 caveat due to dementia.   Sent in for fever and altered mental status. Reported fevers up to 103. Reportedly has been unable to get cxr. Baseline dementia.   Past Medical History  Diagnosis Date  . GERD (gastroesophageal reflux disease)   . Anxiety   . Dementia   . Alzheimer disease   . COPD (chronic obstructive pulmonary disease)   . Hypertension   . Renal disorder    History reviewed. No pertinent past surgical history. History reviewed. No pertinent family history. History  Substance Use Topics  . Smoking status: Never Smoker   . Smokeless tobacco: Not on file  . Alcohol Use: No   OB History    No data available     Review of Systems  Unable to perform ROS     Allergies  Review of patient's allergies indicates no known allergies.  Home Medications   Prior to Admission medications   Medication Sig Start Date End Date Taking? Authorizing Provider  acetaminophen (TYLENOL) 500 MG tablet Take 500 mg by mouth every 6 (six) hours as needed for mild pain.   Yes Historical Provider, MD  ALPRAZolam (XANAX) 0.25 MG tablet Take 0.25 mg by mouth every 6 (six) hours as needed for anxiety (agitation). Not to exceed 6 tablets in 24 hours   Yes Historical Provider, MD  alum & mag hydroxide-simeth (MAALOX/MYLANTA) 200-200-20 MG/5ML suspension Take 30 mLs by mouth every 6 (six) hours as needed for indigestion or heartburn.   Yes Historical Provider, MD  bumetanide (BUMEX) 0.5 MG tablet Take 0.5 mg by mouth 2 (two) times a week. On Monday  and Thursday   Yes Historical Provider, MD  citalopram (CELEXA) 10 MG tablet Take 10 mg by mouth daily.   Yes Historical Provider, MD  donepezil (ARICEPT) 10 MG tablet Take 10 mg by mouth at bedtime.   Yes Historical Provider, MD  ENSURE (ENSURE) Take 237 mLs by mouth 3 (three) times daily with meals.   Yes Historical Provider, MD  guaiFENesin (MUCINEX) 600 MG 12 hr tablet Take 600 mg by mouth 2 (two) times daily.   Yes Historical Provider, MD  levofloxacin (LEVAQUIN) 500 MG tablet Take 500 mg by mouth daily.   Yes Historical Provider, MD  lisinopril (PRINIVIL,ZESTRIL) 10 MG tablet Take 10 mg by mouth daily.   Yes Historical Provider, MD  loperamide (IMODIUM) 2 MG capsule Take 2 mg by mouth as needed for diarrhea or loose stools (not to exceed 8 doses in 24 hours).   Yes Historical Provider, MD  mirtazapine (REMERON) 30 MG tablet Take 30 mg by mouth at bedtime.   Yes Historical Provider, MD  montelukast (SINGULAIR) 10 MG tablet Take 10 mg by mouth at bedtime.   Yes Historical Provider, MD  potassium chloride (K-DUR,KLOR-CON) 10 MEQ tablet Take 10 mEq by mouth daily.   Yes Historical Provider, MD  risperiDONE (RISPERDAL) 0.25 MG tablet Take 0.25 mg by mouth at bedtime.   Yes Historical Provider, MD  Vitamin D, Ergocalciferol, (DRISDOL) 50000 UNITS CAPS capsule Take 50,000 Units by mouth every Thursday.  Yes Historical Provider, MD  cephALEXin (KEFLEX) 500 MG capsule Take 1 capsule (500 mg total) by mouth 4 (four) times daily. Patient not taking: Reported on 04/13/2014 03/09/14   Arthor CaptainAbigail Harris, PA-C   BP 185/64 mmHg  Pulse 81  Temp(Src) 97.7 F (36.5 C) (Oral)  Resp 20  SpO2 99% Physical Exam  Constitutional: She appears well-developed.  HENT:  Head: Atraumatic.  Purulent nasal drainage  Cardiovascular: Normal rate and regular rhythm.   Pulmonary/Chest: Effort normal and breath sounds normal.  Abdominal: Soft. There is no tenderness.  Neurological: She is alert.  Patient is somewhat  uncooperative. Moves all extremities.  Skin: Skin is warm.    ED Course  Procedures (including critical care time) Labs Review Labs Reviewed  COMPREHENSIVE METABOLIC PANEL - Abnormal; Notable for the following:    GFR calc non Af Amer 43 (*)    GFR calc Af Amer 50 (*)    All other components within normal limits  CBC WITH DIFFERENTIAL/PLATELET  URINALYSIS, ROUTINE W REFLEX MICROSCOPIC    Imaging Review Dg Chest Portable 1 View  06/17/2014   CLINICAL DATA:  Altered mental status. Aggressive behavior. Dementia.  EXAM: PORTABLE CHEST - 1 VIEW  COMPARISON:  06/07/2014  FINDINGS: The cardiac silhouette, mediastinal and hilar contours are within normal limits and stable. Chronic emphysematous and bronchitic lung changes with upper lobe scarring in particular but no acute pulmonary findings. No definite infiltrates or effusions. The bony thorax is intact.  IMPRESSION: Chronic lung changes without definite acute overlying pulmonary process.   Electronically Signed   By: Loralie ChampagneMark  Gallerani M.D.   On: 06/17/2014 13:26     EKG Interpretation None      MDM   Final diagnoses:  Aggressive behavior    Patient sent in for reported fever and aggressive behavior. Chest x-ray and lab work and urinalysis reassuring. Will discharge back to nursing home. No fever.    Juliet RudeNathan R. Rubin PayorPickering, MD 06/17/14 1549  Juliet RudeNathan R. Rubin PayorPickering, MD 06/17/14 1549

## 2014-07-02 ENCOUNTER — Emergency Department (HOSPITAL_COMMUNITY)
Admission: EM | Admit: 2014-07-02 | Discharge: 2014-07-02 | Disposition: A | Discharge status: Home / Self Care | Payer: Medicare Other | Attending: Emergency Medicine | Admitting: Emergency Medicine

## 2014-07-02 ENCOUNTER — Emergency Department (HOSPITAL_COMMUNITY): Payer: Medicare Other

## 2014-07-02 ENCOUNTER — Encounter (HOSPITAL_COMMUNITY): Payer: Self-pay | Admitting: Emergency Medicine

## 2014-07-02 DIAGNOSIS — I10 Essential (primary) hypertension: Secondary | ICD-10-CM | POA: Insufficient documentation

## 2014-07-02 DIAGNOSIS — J449 Chronic obstructive pulmonary disease, unspecified: Secondary | ICD-10-CM | POA: Diagnosis not present

## 2014-07-02 DIAGNOSIS — K5900 Constipation, unspecified: Secondary | ICD-10-CM | POA: Diagnosis not present

## 2014-07-02 DIAGNOSIS — F0280 Dementia in other diseases classified elsewhere without behavioral disturbance: Secondary | ICD-10-CM | POA: Insufficient documentation

## 2014-07-02 DIAGNOSIS — Z79899 Other long term (current) drug therapy: Secondary | ICD-10-CM | POA: Insufficient documentation

## 2014-07-02 DIAGNOSIS — Z87442 Personal history of urinary calculi: Secondary | ICD-10-CM | POA: Diagnosis not present

## 2014-07-02 DIAGNOSIS — Z792 Long term (current) use of antibiotics: Secondary | ICD-10-CM | POA: Insufficient documentation

## 2014-07-02 DIAGNOSIS — K219 Gastro-esophageal reflux disease without esophagitis: Secondary | ICD-10-CM | POA: Insufficient documentation

## 2014-07-02 DIAGNOSIS — R52 Pain, unspecified: Secondary | ICD-10-CM

## 2014-07-02 DIAGNOSIS — F419 Anxiety disorder, unspecified: Secondary | ICD-10-CM | POA: Insufficient documentation

## 2014-07-02 DIAGNOSIS — G309 Alzheimer's disease, unspecified: Secondary | ICD-10-CM | POA: Diagnosis not present

## 2014-07-02 MED ORDER — MINERAL OIL RE ENEM: ENEMA | RECTAL | Status: DC

## 2014-07-02 MED ORDER — MINERAL OIL RE ENEM
1.0000 | ENEMA | Freq: Once | RECTAL | Status: AC
  Administered 2014-07-02: 1 via RECTAL
  Filled 2014-07-02: qty 1

## 2014-07-02 MED ORDER — POLYETHYLENE GLYCOL 3350 17 G PO PACK: 17.0000 g | PACK | Freq: Every day | ORAL | Status: DC

## 2014-07-02 NOTE — ED Notes (Signed)
Patient transported to X-ray 

## 2014-07-02 NOTE — ED Provider Notes (Signed)
CSN: [id number]     Arrival date & time 07/02/14  1229 History   First MD Initiated Contact with Patient 07/02/14 1252     Chief Complaint  Patient presents with  . Constipation     (Consider location/radiation/quality/duration/timing/severity/associated sxs/prior Treatment) Patient is a 81 y.o. female presenting with constipation. The history is provided by the patient. The history is limited by the absence of a caregiver (pt has severe dementia). No language interpreter was used.  Constipation Severity:  Unable to specify Timing:  Constant Progression:  Unable to specify Pt sent here from nursing home.   They report they think pt is constipated.   They feel like pt has swollen abdomen.  Pt unable to give me any history.  She told nurse she has a baby in her.    Past Medical History  Diagnosis Date  . GERD (gastroesophageal reflux disease)   . Anxiety   . Dementia   . Alzheimer disease   . COPD (chronic obstructive pulmonary disease)   . Hypertension   . Renal disorder    History reviewed. No pertinent past surgical history. No family history on file. History  Substance Use Topics  . Smoking status: Never Smoker   . Smokeless tobacco: Not on file  . Alcohol Use: No   OB History    No data available     Review of Systems  Unable to perform ROS: Dementia  Gastrointestinal: Positive for constipation.      Allergies  Review of patient's allergies indicates no known allergies.  Home Medications   Prior to Admission medications   Medication Sig Start Date End Date Taking? Authorizing Provider  acetaminophen (TYLENOL) 500 MG tablet Take 500 mg by mouth every 6 (six) hours as needed for mild pain.   Yes Historical Provider, MD  ALPRAZolam (XANAX) 0.25 MG tablet Take 0.25 mg by mouth every 6 (six) hours as needed for anxiety (agitation). Not to exceed 6 tablets in 24 hours   Yes Historical Provider, MD  alum & mag hydroxide-simeth (MAALOX/MYLANTA) 200-200-20 MG/5ML  suspension Take 30 mLs by mouth every 6 (six) hours as needed for indigestion or heartburn.   Yes Historical Provider, MD  azelastine (OPTIVAR) 0.05 % ophthalmic solution Place 1 drop into both eyes 2 (two) times daily.   Yes Historical Provider, MD  bumetanide (BUMEX) 1 MG tablet Take 0.5 mg by mouth 2 (two) times a week. Monday and Thursday.   Yes Historical Provider, MD  citalopram (CELEXA) 10 MG tablet Take 10 mg by mouth daily.   Yes Historical Provider, MD  donepezil (ARICEPT) 10 MG tablet Take 10 mg by mouth at bedtime.   Yes Historical Provider, MD  ENSURE (ENSURE) Take 237 mLs by mouth 3 (three) times daily with meals.   Yes Historical Provider, MD  guaifenesin (ROBITUSSIN) 100 MG/5ML syrup Take 200 mg by mouth every 6 (six) hours. @@ 12am, 6 am, 12pm, 6pm   Yes Historical Provider, MD  lisinopril (PRINIVIL,ZESTRIL) 10 MG tablet Take 10 mg by mouth daily.   Yes Historical Provider, MD  loperamide (IMODIUM) 2 MG capsule Take 2 mg by mouth as needed for diarrhea or loose stools (not to exceed 8 doses in 24 hours).   Yes Historical Provider, MD  mirtazapine (REMERON) 30 MG tablet Take 30 mg by mouth at bedtime.   Yes Historical Provider, MD  Neomycin-Bacitracin-Polymyxin (TRIPLE ANTIBIOTIC EX) Apply 1 application topically. Apply after cleaning with normal saline, cover with bandaid or gauze and secure with tape.  Change as needed until healed for skin tears, abrasions, or minor irritations.   Yes Historical Provider, MD  potassium chloride (K-DUR,KLOR-CON) 10 MEQ tablet Take 10 mEq by mouth daily.   Yes Historical Provider, MD  risperiDONE (RISPERDAL) 0.25 MG tablet Take 0.25 mg by mouth at bedtime.   Yes Historical Provider, MD  Vitamin D, Ergocalciferol, (DRISDOL) 50000 UNITS CAPS capsule Take 50,000 Units by mouth every Thursday.   Yes Historical Provider, MD  levofloxacin (LEVAQUIN) 500 MG tablet Take 500 mg by mouth daily.    Historical Provider, MD   BP 133/76 mmHg  Pulse 95  Temp(Src)  98.7 F (37.1 C) (Rectal)  Resp 20  SpO2 100% Physical Exam  Constitutional: She appears well-developed and well-nourished.  HENT:  Head: Normocephalic.  Cardiovascular: Normal rate.   Pulmonary/Chest: Effort normal.  Abdominal: Soft. There is no tenderness. There is no rebound.  Genitourinary:  Rectal exam   Disimpacted distal stool   Musculoskeletal: Normal range of motion.  Neurological: She is alert.  Skin: Skin is warm.    ED Course  Procedures (including critical care time) Labs Review Labs Reviewed - No data to display  Imaging Review No results found.   EKG Interpretation None      MDM   Final diagnoses:  Pain  Constipation, unspecified constipation type    miralax fleets    Lonia SkinnerLeslie K St. FlorianSofia, [place] JerseyPA-C 07/02/14 1438  Merrie RoofJohn David Wofford III, MD 07/03/14 [phone]

## 2014-07-02 NOTE — ED Notes (Addendum)
[facility] with complaints of constipation. Unable to have bowl movement in 3 days. Eating and drinking well. Denies nausea and vomiting.

## 2014-07-02 NOTE — ED Notes (Signed)
Report called to Alvira PhilipsGeraldine, Charity fundraiserN at [facility].

## 2014-07-02 NOTE — ED Notes (Signed)
[facility]

## 2014-07-02 NOTE — ED Notes (Signed)
[facility] called to take pt back to [facility].  

## 2014-07-02 NOTE — Discharge Instructions (Signed)

## 2014-07-02 NOTE — ED Notes (Signed)
Bed: WA08 Expected date: 07/02/14 Expected time: 12:12 PM Means of arrival: Ambulance Comments: Constipation

## 2014-08-22 ENCOUNTER — Emergency Department (HOSPITAL_COMMUNITY)
Admission: EM | Admit: 2014-08-22 | Discharge: 2014-08-22 | Disposition: A | Discharge status: Home / Self Care | Payer: Medicare Other | Attending: Emergency Medicine | Admitting: Emergency Medicine

## 2014-08-22 ENCOUNTER — Encounter (HOSPITAL_COMMUNITY): Payer: Self-pay | Admitting: Emergency Medicine

## 2014-08-22 DIAGNOSIS — S0101XA Laceration without foreign body of scalp, initial encounter: Secondary | ICD-10-CM | POA: Diagnosis present

## 2014-08-22 DIAGNOSIS — Z87448 Personal history of other diseases of urinary system: Secondary | ICD-10-CM | POA: Diagnosis not present

## 2014-08-22 DIAGNOSIS — W1839XA Other fall on same level, initial encounter: Secondary | ICD-10-CM | POA: Diagnosis not present

## 2014-08-22 DIAGNOSIS — I10 Essential (primary) hypertension: Secondary | ICD-10-CM | POA: Diagnosis not present

## 2014-08-22 DIAGNOSIS — J449 Chronic obstructive pulmonary disease, unspecified: Secondary | ICD-10-CM | POA: Diagnosis not present

## 2014-08-22 DIAGNOSIS — Y9389 Activity, other specified: Secondary | ICD-10-CM | POA: Diagnosis not present

## 2014-08-22 DIAGNOSIS — F0280 Dementia in other diseases classified elsewhere without behavioral disturbance: Secondary | ICD-10-CM | POA: Insufficient documentation

## 2014-08-22 DIAGNOSIS — G309 Alzheimer's disease, unspecified: Secondary | ICD-10-CM | POA: Insufficient documentation

## 2014-08-22 DIAGNOSIS — Y998 Other external cause status: Secondary | ICD-10-CM | POA: Insufficient documentation

## 2014-08-22 DIAGNOSIS — Z8719 Personal history of other diseases of the digestive system: Secondary | ICD-10-CM | POA: Diagnosis not present

## 2014-08-22 DIAGNOSIS — Y9289 Other specified places as the place of occurrence of the external cause: Secondary | ICD-10-CM | POA: Diagnosis not present

## 2014-08-22 DIAGNOSIS — F419 Anxiety disorder, unspecified: Secondary | ICD-10-CM | POA: Insufficient documentation

## 2014-08-22 DIAGNOSIS — Z792 Long term (current) use of antibiotics: Secondary | ICD-10-CM | POA: Diagnosis not present

## 2014-08-22 DIAGNOSIS — W19XXXA Unspecified fall, initial encounter: Secondary | ICD-10-CM

## 2014-08-22 NOTE — Discharge Instructions (Signed)
Staple Care and Removal °Your caregiver has used staples today to repair your wound. Staples are used to help a wound heal faster by holding the edges of the wound together. The staples can be removed when the wound has healed well enough to stay together after the staples are removed. A dressing (wound covering), depending on the location of the wound, may have been applied. This may be changed once per day or as instructed. If the dressing sticks, it may be soaked off with soapy water or hydrogen peroxide. °Only take over-the-counter or prescription medicines for pain, discomfort, or fever as directed by your caregiver.  °If you did not receive a tetanus shot today because you did not recall when your last one was given, check with your caregiver when you have your staples removed to determine if one is needed. °Return to your caregiver's office in 1 week or as suggested to have your staples removed. °SEEK IMMEDIATE MEDICAL CARE IF:  °· You have redness, swelling, or increasing pain in the wound. °· You have pus coming from the wound. °· You have a fever. °· You notice a bad smell coming from the wound or dressing. °· Your wound edges break open after staples have been removed. °Document Released: 01/29/2001 Document Revised: 07/29/2011 Document Reviewed: 02/13/2005 °ExitCare® Patient Information ©2015 [employer]. This information is not intended to replace advice given to you by your health care provider. Make sure you discuss any questions you have with your health care provider. ° °

## 2014-08-22 NOTE — ED Notes (Addendum)
Patient wound on head was cleaned with saline, with the assist of Nurse, because patient was trying to scratch us, hit and trying to bite us, and also screaming. Patient removed gown, I placed warm blankets over patient.

## 2014-08-22 NOTE — ED Notes (Addendum)
Patient carm and sleeping at this time.

## 2014-08-22 NOTE — ED Notes (Signed)
CALLED [facility] FOR PT TRANSPORT BACK TO RESIDENCE

## 2014-08-22 NOTE — ED Notes (Signed)
Pt arrives from [facility] with advanced dementia and fall this AM. Pt with laceration to posterior head. Bleeding controlled. Unknown last tetanus. Pt disoriented x4, baseline per EMS.

## 2014-08-22 NOTE — ED Notes (Addendum)
I am sitting at patient bedside,because she tried to get up, pt. Very agitated.Unable to get repeat V/S at this time.

## 2014-08-22 NOTE — ED Provider Notes (Signed)
CSN: [id number]     Arrival date & time 08/22/14  0753 History   First MD Initiated Contact with Patient 08/22/14 [phone]     Chief Complaint  Patient presents with  . Fall    HPI The patient arrived to the emergency room from [facility] Patient reportedly had a fall this morning and sustained an injury to the back of her head.  The patient has advanced dementia. She is unable to tell me what occurred this morning or what is specifically bothering her. The patient is actually very easily agitated. Any interaction with her causes her to start screaming and yelling. She states that we are trying to harm her.  Past Medical History  Diagnosis Date  . GERD (gastroesophageal reflux disease)   . Anxiety   . Dementia   . Alzheimer disease   . COPD (chronic obstructive pulmonary disease)   . Hypertension   . Renal disorder    History reviewed. No pertinent past surgical history. History reviewed. No pertinent family history. History  Substance Use Topics  . Smoking status: Never Smoker   . Smokeless tobacco: Not on file  . Alcohol Use: No   OB History    No data available     Review of Systems  Unable to perform ROS: Dementia      Allergies  Review of patient's allergies indicates no known allergies.  Home Medications   Prior to Admission medications   Medication Sig Start Date End Date Taking? Authorizing Provider  acetaminophen (TYLENOL) 500 MG tablet Take 500 mg by mouth every 6 (six) hours as needed for mild pain.    Historical Provider, MD  ALPRAZolam Prudy Feeler) 0.25 MG tablet Take 0.25 mg by mouth every 6 (six) hours as needed for anxiety (agitation). Not to exceed 6 tablets in 24 hours    Historical Provider, MD  alum & mag hydroxide-simeth (MAALOX/MYLANTA) 200-200-20 MG/5ML suspension Take 30 mLs by mouth every 6 (six) hours as needed for indigestion or heartburn.    Historical Provider, MD  azelastine (OPTIVAR) 0.05 % ophthalmic solution Place 1 drop into  both eyes 2 (two) times daily.    Historical Provider, MD  bumetanide (BUMEX) 1 MG tablet Take 0.5 mg by mouth 2 (two) times a week. Monday and Thursday.    Historical Provider, MD  citalopram (CELEXA) 10 MG tablet Take 10 mg by mouth daily.    Historical Provider, MD  donepezil (ARICEPT) 10 MG tablet Take 10 mg by mouth at bedtime.    Historical Provider, MD  ENSURE (ENSURE) Take 237 mLs by mouth 3 (three) times daily with meals.    Historical Provider, MD  guaifenesin (ROBITUSSIN) 100 MG/5ML syrup Take 200 mg by mouth every 6 (six) hours. @@ 12am, 6 am, 12pm, 6pm    Historical Provider, MD  levofloxacin (LEVAQUIN) 500 MG tablet Take 500 mg by mouth daily.    Historical Provider, MD  lisinopril (PRINIVIL,ZESTRIL) 10 MG tablet Take 10 mg by mouth daily.    Historical Provider, MD  loperamide (IMODIUM) 2 MG capsule Take 2 mg by mouth as needed for diarrhea or loose stools (not to exceed 8 doses in 24 hours).    Historical Provider, MD  mineral oil enema One enema tomorrow if constipation persist.  May repeat daily for 2 days if needed 07/02/14   Elson Areas, PA-C  mirtazapine (REMERON) 30 MG tablet Take 30 mg by mouth at bedtime.    Historical Provider, MD  Neomycin-Bacitracin-Polymyxin (TRIPLE ANTIBIOTIC  EX) Apply 1 application topically. Apply after cleaning with normal saline, cover with bandaid or gauze and secure with tape. Change as needed until healed for skin tears, abrasions, or minor irritations.    Historical Provider, MD  polyethylene glycol (MIRALAX) packet Take 17 g by mouth daily. 07/02/14   Elson AreasLeslie K Sofia, PA-C  potassium chloride (K-DUR,KLOR-CON) 10 MEQ tablet Take 10 mEq by mouth daily.    Historical Provider, MD  risperiDONE (RISPERDAL) 0.25 MG tablet Take 0.25 mg by mouth at bedtime.    Historical Provider, MD  Vitamin D, Ergocalciferol, (DRISDOL) 50000 UNITS CAPS capsule Take 50,000 Units by mouth every Thursday.    Historical Provider, MD   BP 160/104 mmHg  Pulse 90   Temp(Src) 98.4 F (36.9 C) (Temporal)  Resp 20  SpO2 100% Physical Exam  Constitutional: No distress.  HENT:  Head: Normocephalic.  Right Ear: External ear normal.  Left Ear: External ear normal.  Small abrasion posterior scalp, no active bleeding, no hematoma  Eyes: Conjunctivae are normal. Right eye exhibits no discharge. Left eye exhibits no discharge. No scleral icterus.  Neck: Neck supple. No tracheal deviation present.  No spinal tenderness  Cardiovascular: Normal rate, regular rhythm and intact distal pulses.   Pulmonary/Chest: Effort normal and breath sounds normal. No stridor. No respiratory distress. She has no wheezes. She has no rales.  Abdominal: Soft. Bowel sounds are normal. She exhibits no distension. There is no tenderness. There is no rebound and no guarding.  Musculoskeletal: She exhibits no edema or tenderness.  No tenderness palpation of the cervical thoracic and lumbar spine, patient is able to move both her arms without difficulty, unable to move her hips and she does not appear to have any complaints of pain or discomfort  Neurological: She has normal strength. She is not disoriented. No cranial nerve deficit (no facial droop, extraocular movements intact, no slurred speech) or sensory deficit. She exhibits normal muscle tone. She displays no seizure activity. Coordination normal.  Patient is awake and alert sitting up in the bed, she will communicate with us and answer some questions although not necessarily appropriately, the patient gets very angry and attempts to scratch us and grab us whenever we examine her  Skin: Skin is warm and dry. No rash noted. She is not diaphoretic.  Psychiatric: She has a normal mood and affect.  Nursing note and vitals reviewed.   ED Course  LACERATION REPAIR Date/Time: 08/22/2014 8:46 AM Performed by: Linwood DibblesKNAPP, Dillinger Aston Authorized by: Linwood DibblesKNAPP, Lavaris Sexson Consent: Verbal consent not obtained. Time out: Immediately prior to procedure a "time out"  was called to verify the correct patient, procedure, equipment, support staff and site/side marked as required. Body area: head/neck Location details: scalp Laceration length: 1.5 cm Tendon involvement: none Nerve involvement: none Vascular damage: no Patient sedated: no Preparation: Patient was prepped and draped in the usual sterile fashion. Irrigation solution: saline Irrigation method: jet lavage Amount of cleaning: standard Skin closure: staples (2) Number of sutures: 2 Approximation: close Patient tolerance: Patient tolerated the procedure well with no immediate complications   (including critical care time) Labs Review Labs Reviewed - No data to display  Imaging Review No results found.   EKG Interpretation None      MDM   Final diagnoses:  Fall, initial encounter  Scalp laceration, initial encounter    The patient's vital signs are otherwise unremarkable. I see just a minor abrasion to the back of her scalp. Do not see any evidence of any other  significant injuries. Patient is very aggressive with any sort of interaction with her. In order to proceed with any more examination she would require sedation. Overall I doubt any significant injuries or acute illness. I do not think the benefit of sedating her and obtaining additional studies outweighs the risk of this. Attempted to call the patient's family members telephone. I left a message.  Pt will be discharged back to her nursing facility for observation.    Linwood Dibbles, MD 08/22/14 [phone]

## 2014-10-29 ENCOUNTER — Emergency Department (HOSPITAL_COMMUNITY): Payer: Medicare Other

## 2014-10-29 ENCOUNTER — Encounter (HOSPITAL_COMMUNITY): Payer: Self-pay | Admitting: Emergency Medicine

## 2014-10-29 ENCOUNTER — Emergency Department (HOSPITAL_COMMUNITY)
Admission: EM | Admit: 2014-10-29 | Discharge: 2014-10-29 | Disposition: A | Discharge status: Nursing Home (Includes ICF) | Payer: Medicare Other | Attending: Emergency Medicine | Admitting: Emergency Medicine

## 2014-10-29 DIAGNOSIS — W19XXXA Unspecified fall, initial encounter: Secondary | ICD-10-CM | POA: Insufficient documentation

## 2014-10-29 DIAGNOSIS — F0390 Unspecified dementia without behavioral disturbance: Secondary | ICD-10-CM | POA: Diagnosis not present

## 2014-10-29 DIAGNOSIS — K219 Gastro-esophageal reflux disease without esophagitis: Secondary | ICD-10-CM | POA: Diagnosis not present

## 2014-10-29 DIAGNOSIS — Y92002 Bathroom of unspecified non-institutional (private) residence single-family (private) house as the place of occurrence of the external cause: Secondary | ICD-10-CM | POA: Insufficient documentation

## 2014-10-29 DIAGNOSIS — Z79899 Other long term (current) drug therapy: Secondary | ICD-10-CM | POA: Diagnosis not present

## 2014-10-29 DIAGNOSIS — J449 Chronic obstructive pulmonary disease, unspecified: Secondary | ICD-10-CM | POA: Insufficient documentation

## 2014-10-29 DIAGNOSIS — Y9389 Activity, other specified: Secondary | ICD-10-CM | POA: Insufficient documentation

## 2014-10-29 DIAGNOSIS — I10 Essential (primary) hypertension: Secondary | ICD-10-CM | POA: Insufficient documentation

## 2014-10-29 DIAGNOSIS — F419 Anxiety disorder, unspecified: Secondary | ICD-10-CM | POA: Diagnosis not present

## 2014-10-29 DIAGNOSIS — S60811A Abrasion of right wrist, initial encounter: Secondary | ICD-10-CM | POA: Diagnosis not present

## 2014-10-29 DIAGNOSIS — Y998 Other external cause status: Secondary | ICD-10-CM | POA: Diagnosis not present

## 2014-10-29 DIAGNOSIS — S50811A Abrasion of right forearm, initial encounter: Secondary | ICD-10-CM | POA: Diagnosis not present

## 2014-10-29 DIAGNOSIS — Z87448 Personal history of other diseases of urinary system: Secondary | ICD-10-CM | POA: Diagnosis not present

## 2014-10-29 DIAGNOSIS — S6991XA Unspecified injury of right wrist, hand and finger(s), initial encounter: Secondary | ICD-10-CM | POA: Diagnosis present

## 2014-10-29 LAB — CBG MONITORING, ED: GLUCOSE-CAPILLARY: 92 mg/dL (ref 65–99)

## 2014-10-29 NOTE — ED Provider Notes (Signed)
CSN: [id number]     Arrival date & time 10/29/14  0258 History  This chart was scribed for Loren Racer, MD by Abel Presto, ED Scribe. This patient was seen in room A04C/A04C and the patient's care was started at 3:09 AM.    Chief Complaint  Patient presents with  . Fall   LEVEL 5 CAVEAT- DEMENTIA  The history is provided by the EMS personnel, medical records and the patient. No language interpreter was used.   HPI Comments: [name] is a 81 y.o. female brought in by ambulance, with PMHx of Alzheimer disease and dementia COPD, HTN, and renal disorder who presents to the Emergency Department after being found on floor from unwitnessed fall. Pt lives in assisted living and was found by staff on floor of bathroom. Pt presenting in towel "C-collar". Per EMS pt attempted to eat foam C-collar. Pt presenting with abrasion to right forearm and skin tear to right wrist. Pt responsive to some stimuli but sleeping in exam room. At mental baseline per EMS.   Past Medical History  Diagnosis Date  . GERD (gastroesophageal reflux disease)   . Anxiety   . Dementia   . Alzheimer disease   . COPD (chronic obstructive pulmonary disease)   . Hypertension   . Renal disorder    History reviewed. No pertinent past surgical history. No family history on file. History  Substance Use Topics  . Smoking status: Never Smoker   . Smokeless tobacco: Not on file  . Alcohol Use: No   OB History    No data available     Review of Systems  Unable to perform ROS: Dementia  All other systems reviewed and are negative.     Allergies  Review of patient's allergies indicates no known allergies.  Home Medications   Prior to Admission medications   Medication Sig Start Date End Date Taking? Authorizing Provider  acetaminophen (TYLENOL) 500 MG tablet Take 500 mg by mouth every 6 (six) hours as needed for mild pain.    Historical Provider, MD  ALPRAZolam Prudy Feeler) 0.25 MG tablet Take 0.25 mg by  mouth every 6 (six) hours as needed for anxiety (agitation). Not to exceed 6 tablets in 24 hours    Historical Provider, MD  alum & mag hydroxide-simeth (MAALOX/MYLANTA) 200-200-20 MG/5ML suspension Take 30 mLs by mouth every 6 (six) hours as needed for indigestion or heartburn.    Historical Provider, MD  azelastine (OPTIVAR) 0.05 % ophthalmic solution Place 1 drop into both eyes 2 (two) times daily.    Historical Provider, MD  bumetanide (BUMEX) 1 MG tablet Take 0.5 mg by mouth 2 (two) times a week. Monday and Thursday.    Historical Provider, MD  citalopram (CELEXA) 10 MG tablet Take 10 mg by mouth daily.    Historical Provider, MD  donepezil (ARICEPT) 10 MG tablet Take 10 mg by mouth at bedtime.    Historical Provider, MD  ENSURE (ENSURE) Take 237 mLs by mouth 3 (three) times daily with meals.    Historical Provider, MD  guaifenesin (ROBITUSSIN) 100 MG/5ML syrup Take 200 mg by mouth every 6 (six) hours. @@ 12am, 6 am, 12pm, 6pm    Historical Provider, MD  levofloxacin (LEVAQUIN) 500 MG tablet Take 500 mg by mouth daily.    Historical Provider, MD  lisinopril (PRINIVIL,ZESTRIL) 10 MG tablet Take 10 mg by mouth daily.    Historical Provider, MD  loperamide (IMODIUM) 2 MG capsule Take 2 mg by mouth as needed for diarrhea or  loose stools (not to exceed 8 doses in 24 hours).    Historical Provider, MD  mineral oil enema One enema tomorrow if constipation persist.  May repeat daily for 2 days if needed 07/02/14   Elson Areas, PA-C  mirtazapine (REMERON) 30 MG tablet Take 30 mg by mouth at bedtime.    Historical Provider, MD  Neomycin-Bacitracin-Polymyxin (TRIPLE ANTIBIOTIC EX) Apply 1 application topically. Apply after cleaning with normal saline, cover with bandaid or gauze and secure with tape. Change as needed until healed for skin tears, abrasions, or minor irritations.    Historical Provider, MD  polyethylene glycol (MIRALAX) packet Take 17 g by mouth daily. 07/02/14   Elson Areas, PA-C   potassium chloride (K-DUR,KLOR-CON) 10 MEQ tablet Take 10 mEq by mouth daily.    Historical Provider, MD  risperiDONE (RISPERDAL) 0.25 MG tablet Take 0.25 mg by mouth at bedtime.    Historical Provider, MD  Vitamin D, Ergocalciferol, (DRISDOL) 50000 UNITS CAPS capsule Take 50,000 Units by mouth every Thursday.    Historical Provider, MD   BP 157/120 mmHg  Pulse 65  Temp(Src) 98.1 F (36.7 C) (Oral)  Resp 37  SpO2 98% Physical Exam  Constitutional: She appears well-developed and well-nourished. No distress.  HENT:  Head: Normocephalic and atraumatic.  Mouth/Throat: Oropharynx is clear and moist.  No obvious head injury  Eyes: EOM are normal. Pupils are equal, round, and reactive to light.  Neck: Normal range of motion. Neck supple.  No posterior midline cervical tenderness or evidence of injury  Cardiovascular: Normal rate and regular rhythm.   Pulmonary/Chest: Effort normal and breath sounds normal. No respiratory distress. She has no wheezes. She has no rales. She exhibits no tenderness.  Abdominal: Soft. Bowel sounds are normal. She exhibits no distension and no mass. There is no tenderness. There is no rebound and no guarding.  Musculoskeletal: Normal range of motion. She exhibits no edema or tenderness.  Cog wheel rigidity in all extremities. No obvious deformity or swelling to any joint. Distal pulses intact.  Neurological:  Patient opens eyes to voice. She follows very simple commands. Appears to be moving all extremities.  Skin: Skin is warm and dry. No rash noted. No erythema.  Small abrasion noted to the ulnar surface of the right wrist. No active bleeding.  Nursing note and vitals reviewed.   ED Course  Procedures (including critical care time) DIAGNOSTIC STUDIES: Oxygen Saturation is 98% on room air, normal by my interpretation.    COORDINATION OF CARE: 3:12 AM Discussed treatment plan with patient at beside, the patient agrees with the plan and has no further  questions at this time.   Labs Review Labs Reviewed  CBG MONITORING, ED    Imaging Review Ct Head Wo Contrast  10/29/2014   CLINICAL DATA:  Found on floor.  Presumed unwitnessed fall.  EXAM: CT HEAD WITHOUT CONTRAST  TECHNIQUE: Contiguous axial images were obtained from the base of the skull through the vertex without intravenous contrast.  COMPARISON:  07/19/2013  FINDINGS: There is no intracranial hemorrhage or extra-axial fluid collection. There is moderately severe generalized atrophy. There is hemispheric white matter hypodensity consistent with chronic small vessel ischemic disease, also involving the central pons.  Skull base and calvarium are intact. There is fluid throughout the right mastoid air cells, also in the aditus ad antrum and in the middle ear. This is new.  IMPRESSION: 1. Negative for acute intracranial traumatic injury. 2. Moderately severe atrophy and chronic small vessel ischemic  disease. 3. New right mastoid effusion, as well as fluid in the middle ear and aditus.   Electronically Signed   By: Ellery Plunk M.D.   On: 10/29/2014 04:11     EKG Interpretation None      MDM   Final diagnoses:  Fall, initial encounter  Abrasion of wrist, right, initial encounter    I personally performed the services described in this documentation, which was scribed in my presence. The recorded information has been reviewed and is accurate.  CT head without evidence of injury. Wrist abrasion is dressed. Will discharge back to the nursing home. Return precautions given.    Loren Racer, MD 10/29/14 [phone]

## 2014-10-29 NOTE — Discharge Instructions (Signed)

## 2014-10-29 NOTE — ED Notes (Signed)
Pt arrives from [facility] assisted living via [place], EMS reports pt found on floor of bathroom by staff.  Hx advanced dementia.  EMS reports pt "tried to eat foam of c-collar", pt neck braced with cloth towels.  Skin tear noted to Rt wrist, gauze bandage in place from EMS, bleeding controlled at this time.  NAD noted at this time.  Resp e/u.

## 2014-11-18 ENCOUNTER — Encounter (HOSPITAL_COMMUNITY): Payer: Self-pay | Admitting: Oncology

## 2014-11-18 ENCOUNTER — Emergency Department (HOSPITAL_COMMUNITY)
Admission: EM | Admit: 2014-11-18 | Discharge: 2014-11-19 | Disposition: A | Discharge status: Home / Self Care | Payer: Medicare Other | Attending: Emergency Medicine | Admitting: Emergency Medicine

## 2014-11-18 DIAGNOSIS — W06XXXA Fall from bed, initial encounter: Secondary | ICD-10-CM | POA: Diagnosis not present

## 2014-11-18 DIAGNOSIS — Y9389 Activity, other specified: Secondary | ICD-10-CM | POA: Insufficient documentation

## 2014-11-18 DIAGNOSIS — F0280 Dementia in other diseases classified elsewhere without behavioral disturbance: Secondary | ICD-10-CM | POA: Diagnosis not present

## 2014-11-18 DIAGNOSIS — Y998 Other external cause status: Secondary | ICD-10-CM | POA: Insufficient documentation

## 2014-11-18 DIAGNOSIS — F419 Anxiety disorder, unspecified: Secondary | ICD-10-CM | POA: Insufficient documentation

## 2014-11-18 DIAGNOSIS — Z043 Encounter for examination and observation following other accident: Secondary | ICD-10-CM | POA: Insufficient documentation

## 2014-11-18 DIAGNOSIS — Z87448 Personal history of other diseases of urinary system: Secondary | ICD-10-CM | POA: Insufficient documentation

## 2014-11-18 DIAGNOSIS — G309 Alzheimer's disease, unspecified: Secondary | ICD-10-CM | POA: Diagnosis not present

## 2014-11-18 DIAGNOSIS — Y92128 Other place in nursing home as the place of occurrence of the external cause: Secondary | ICD-10-CM | POA: Diagnosis not present

## 2014-11-18 DIAGNOSIS — I10 Essential (primary) hypertension: Secondary | ICD-10-CM | POA: Diagnosis not present

## 2014-11-18 DIAGNOSIS — Z8719 Personal history of other diseases of the digestive system: Secondary | ICD-10-CM | POA: Diagnosis not present

## 2014-11-18 DIAGNOSIS — W19XXXA Unspecified fall, initial encounter: Secondary | ICD-10-CM

## 2014-11-18 DIAGNOSIS — Z79899 Other long term (current) drug therapy: Secondary | ICD-10-CM | POA: Insufficient documentation

## 2014-11-18 NOTE — ED Notes (Signed)
Bed: [id number] Expected date:  Expected time:  Means of arrival:  Comments: EMS 43F fall head vs bed, no injury noted

## 2014-11-18 NOTE — ED Notes (Signed)
Per EMS pt slid out of bed unwitnessed.  Pt has a hx of dementia and cannot say if she hit her head or had any LOC.  [facility]  Per EMS no pain upon standing.  Pt does not take anticoagulants.

## 2014-11-19 ENCOUNTER — Emergency Department (HOSPITAL_COMMUNITY): Payer: Medicare Other

## 2014-11-19 DIAGNOSIS — Z043 Encounter for examination and observation following other accident: Secondary | ICD-10-CM | POA: Diagnosis not present

## 2014-11-19 NOTE — Discharge Instructions (Signed)
Fall Prevention in Hospitals As a hospital patient, your condition and the treatments you receive can increase your risk for falls. Some additional risk factors for falls in a hospital include:  Being in an unfamiliar environment.  Being on bed rest.  Your surgery.  Taking certain medicines.  Your tubing requirements, such as intravenous (IV) therapy or catheters. It is important that you learn how to decrease fall risks while at the hospital. Below are important tips that can help prevent falls. SAFETY TIPS FOR PREVENTING FALLS Talk about your risk of falling.  Ask your caregiver why you are at risk for falling. Is it your medicine, illness, tubing placement, or something else?  Make a plan with your caregiver to keep you safe from falls.  Ask your caregiver or pharmacist about side effect of your medicines. Some medicines can make you dizzy or affect your coordination. Ask for help.  Ask for help before getting out of bed. You may need to press your call button.  Ask for assistance in getting you safely to the toilet.  Ask for a walker or cane to be put at your bedside. Ask that most of the side rails on your bed be placed up before your caregiver leaves the room.  Ask family or friends to sit with you.  Ask for things that are out of your reach, such as your glasses, hearing aids, telephone, bedside table, or call button. Follow these tips to avoid falling:  Stay lying or seated, rather than standing, while waiting for help.  Wear rubber-soled slippers or shoes whenever you walk in the hospital.  Avoid quick, sudden movements.  Change positions slowly.  Sit on the side of your bed before standing.  Stand up slowly and wait before you start to walk.  Let your caregiver know if there is a spill on the floor.  Pay careful attention to the medical equipment, electrical cords, and tubes around you.  When you need help, use your call button by your bed or in the  bathroom. Wait for one of your caregivers to help you.  If you feel dizzy or unsure of your footing, return to bed and wait for assistance.  Avoid being distracted by the TV, telephone, or another person in your room.  Do not lean or support yourself on rolling objects, such as IV poles or bedside tables. Document Released: 05/03/2000 Document Revised: 04/22/2012 Document Reviewed: 01/12/2012 [facility] Patient Information 2015 [employer] [place]. This information is not intended to replace advice given to you by your health care provider. Make sure you discuss any questions you have with your health care provider. There is no fracture or dislocation.  No intracranial bleeding on the spines CT scan of her head and neck

## 2014-11-19 NOTE — ED Provider Notes (Signed)
CSN: [id number]     Arrival date & time 11/18/14  2313 History   First MD Initiated Contact with Patient 11/18/14 2352     Chief Complaint  Patient presents with  . Fall     (Consider location/radiation/quality/duration/timing/severity/associated sxs/prior Treatment) HPI Comments: This is a 81 year old female who lives in an Alzheimer's care unit, was found beside her bed.  It is assumed she slipped out of her bed and later has a history of frequent falls.  She was last seen here June 11 after a fall. She is nonverbal but she smiles pleasantly to questions.  She has a history of COPD, hypertension, mental disorder and Alzheimer's  Patient is a 81 y.o. female presenting with fall. The history is provided by the nursing home. The history is limited by the condition of the patient and the absence of a caregiver.  Fall This is a recurrent problem. The current episode started today. The problem has been unchanged. Pertinent negatives include no fever or vomiting.    Past Medical History  Diagnosis Date  . GERD (gastroesophageal reflux disease)   . Anxiety   . Dementia   . Alzheimer disease   . COPD (chronic obstructive pulmonary disease)   . Hypertension   . Renal disorder    History reviewed. No pertinent past surgical history. No family history on file. History  Substance Use Topics  . Smoking status: Never Smoker   . Smokeless tobacco: Not on file  . Alcohol Use: No   OB History    No data available     Review of Systems  Unable to perform ROS: Patient nonverbal  Constitutional: Negative for fever.  Gastrointestinal: Negative for vomiting.  Skin: Negative for wound.  All other systems reviewed and are negative.     Allergies  Review of patient's allergies indicates no known allergies.  Home Medications   Prior to Admission medications   Medication Sig Start Date End Date Taking? Authorizing Provider  bumetanide (BUMEX) 1 MG tablet Take 0.5 mg by mouth 2 (two)  times a week. Monday and Thursday.   Yes Historical Provider, MD  citalopram (CELEXA) 10 MG tablet Take 10 mg by mouth daily.   Yes Historical Provider, MD  ENSURE (ENSURE) Take 237 mLs by mouth 3 (three) times daily with meals.   Yes Historical Provider, MD  hydrocerin (EUCERIN) CREA Apply 1 application topically 2 (two) times daily.   Yes Historical Provider, MD  lisinopril (PRINIVIL,ZESTRIL) 10 MG tablet Take 10 mg by mouth daily.   Yes Historical Provider, MD  mirtazapine (REMERON) 30 MG tablet Take 30 mg by mouth at bedtime.   Yes Historical Provider, MD  potassium chloride 20 MEQ/15ML (10%) SOLN Take 10 mEq by mouth daily.   Yes Historical Provider, MD  risperiDONE (RISPERDAL) 0.25 MG tablet Take 0.25 mg by mouth daily.    Yes Historical Provider, MD  risperiDONE (RISPERDAL) 0.5 MG tablet Take 0.5 mg by mouth at bedtime.   Yes Historical Provider, MD  acetaminophen (TYLENOL) 500 MG tablet Take 500 mg by mouth every 6 (six) hours as needed for mild pain.    Historical Provider, MD  ALPRAZolam Prudy Feeler(XANAX) 0.25 MG tablet Take 0.25 mg by mouth every 6 (six) hours as needed for anxiety (agitation). Not to exceed 6 tablets in 24 hours    Historical Provider, MD  alum & mag hydroxide-simeth (MAALOX/MYLANTA) 200-200-20 MG/5ML suspension Take 30 mLs by mouth every 6 (six) hours as needed for indigestion or heartburn.  Historical Provider, MD  azelastine (OPTIVAR) 0.05 % ophthalmic solution Place 1 drop into both eyes 2 (two) times daily.    Historical Provider, MD  donepezil (ARICEPT) 10 MG tablet Take 10 mg by mouth at bedtime.    Historical Provider, MD  guaifenesin (ROBITUSSIN) 100 MG/5ML syrup Take 200 mg by mouth every 6 (six) hours.     Historical Provider, MD  levofloxacin (LEVAQUIN) 500 MG tablet Take 500 mg by mouth daily.    Historical Provider, MD  loperamide (IMODIUM) 2 MG capsule Take 2 mg by mouth as needed for diarrhea or loose stools (not to exceed 8 doses in 24 hours).    Historical  Provider, MD  mineral oil enema One enema tomorrow if constipation persist.  May repeat daily for 2 days if needed 07/02/14   Elson Areas, PA-C  Neomycin-Bacitracin-Polymyxin (TRIPLE ANTIBIOTIC EX) Apply 1 application topically. Apply after cleaning with normal saline, cover with bandaid or gauze and secure with tape. Change as needed until healed for skin tears, abrasions, or minor irritations.    Historical Provider, MD  polyethylene glycol (MIRALAX) packet Take 17 g by mouth daily. 07/02/14   Elson Areas, PA-C  potassium chloride (K-DUR,KLOR-CON) 10 MEQ tablet Take 10 mEq by mouth daily.    Historical Provider, MD  Vitamin D, Ergocalciferol, (DRISDOL) 50000 UNITS CAPS capsule Take 50,000 Units by mouth every Thursday.    Historical Provider, MD   BP 189/77 mmHg  Pulse 64  Temp(Src) 98.7 F (37.1 C) (Oral)  Resp 16  SpO2 96% Physical Exam  Constitutional: She appears well-developed and well-nourished.  HENT:  Head: Normocephalic.  Right Ear: External ear normal.  Neck: Normal range of motion. No spinous process tenderness and no muscular tenderness present.  Cardiovascular: Normal rate.   Pulmonary/Chest: Effort normal.  Musculoskeletal: Normal range of motion. She exhibits no edema or tenderness.  This is moving all extremities freely.  She does not have any pain with pelvic rock.  There is no shortening or deformity of the legs.  There are no skin tears or bruises on physical examination  Skin: Skin is warm. No erythema.  No apparent discomfort.  Her extremities are moved passively  Nursing note and vitals reviewed.   ED Course  Procedures (including critical care time) Labs Review Labs Reviewed - No data to display  Imaging Review Ct Head Wo Contrast  11/19/2014   CLINICAL DATA:  Status post unwitnessed fall. Concern for head or cervical spine injury. Initial encounter.  EXAM: CT HEAD WITHOUT CONTRAST  CT CERVICAL SPINE WITHOUT CONTRAST  TECHNIQUE: Multidetector CT imaging  of the head and cervical spine was performed following the standard protocol without intravenous contrast. Multiplanar CT image reconstructions of the cervical spine were also generated.  COMPARISON:  CT of the head performed 10/29/2014, and CT of the head and cervical spine performed 04/25/2013  FINDINGS: CT HEAD FINDINGS  There is no evidence of acute infarction, mass lesion, or intra- or extra-axial hemorrhage on CT.  Prominence of the ventricles and sulci reflects moderate cortical volume loss. Cerebellar atrophy is noted. Diffuse periventricular and subcortical white matter change likely reflects small vessel ischemic microangiopathy. Chronic ischemic change is noted at the basal ganglia bilaterally.  The brainstem and fourth ventricle are within normal limits. The cerebral hemispheres demonstrate grossly normal gray-white differentiation. No mass effect or midline shift is seen.  There is no evidence of fracture; visualized osseous structures are unremarkable in appearance. The orbits are within normal limits. There  is complete opacification of the right mastoid air cells. The paranasal sinuses and left mastoid air cells are well-aerated. No significant soft tissue abnormalities are seen.  CT CERVICAL SPINE FINDINGS  There is no evidence of acute fracture or subluxation. Vertebral bodies demonstrate normal height. There is stable grade 1 retrolisthesis of C3 on C4, and stable grade 1 anterolisthesis of C5 on C6 and of C6 on C7. Disc space narrowing is noted at C3-C4 and C6-C7. Mild underlying facet disease is noted. Prevertebral soft tissues are within normal limits.  The thyroid gland is unremarkable in appearance. Prominent scarring is noted at the lung apices, with associated calcification. Mild calcification is seen at the carotid bifurcations bilaterally.  IMPRESSION: 1. No evidence of traumatic intracranial injury or fracture. 2. No evidence of acute fracture or subluxation along the cervical spine. 3.  Moderate cortical volume loss and diffuse small vessel ischemic microangiopathy. Chronic ischemic change at the basal ganglia bilaterally. 4. Complete opacification of the right mastoid air cells. 5. Mild degenerative change along the cervical spine. 6. Prominent scarring at the lung apices, with associated calcification. 7. Mild calcification at the carotid bifurcations bilaterally.   Electronically Signed   By: Roanna Raider M.D.   On: 11/19/2014 01:19   Ct Cervical Spine Wo Contrast  11/19/2014   CLINICAL DATA:  Status post unwitnessed fall. Concern for head or cervical spine injury. Initial encounter.  EXAM: CT HEAD WITHOUT CONTRAST  CT CERVICAL SPINE WITHOUT CONTRAST  TECHNIQUE: Multidetector CT imaging of the head and cervical spine was performed following the standard protocol without intravenous contrast. Multiplanar CT image reconstructions of the cervical spine were also generated.  COMPARISON:  CT of the head performed 10/29/2014, and CT of the head and cervical spine performed 04/25/2013  FINDINGS: CT HEAD FINDINGS  There is no evidence of acute infarction, mass lesion, or intra- or extra-axial hemorrhage on CT.  Prominence of the ventricles and sulci reflects moderate cortical volume loss. Cerebellar atrophy is noted. Diffuse periventricular and subcortical white matter change likely reflects small vessel ischemic microangiopathy. Chronic ischemic change is noted at the basal ganglia bilaterally.  The brainstem and fourth ventricle are within normal limits. The cerebral hemispheres demonstrate grossly normal gray-white differentiation. No mass effect or midline shift is seen.  There is no evidence of fracture; visualized osseous structures are unremarkable in appearance. The orbits are within normal limits. There is complete opacification of the right mastoid air cells. The paranasal sinuses and left mastoid air cells are well-aerated. No significant soft tissue abnormalities are seen.  CT CERVICAL  SPINE FINDINGS  There is no evidence of acute fracture or subluxation. Vertebral bodies demonstrate normal height. There is stable grade 1 retrolisthesis of C3 on C4, and stable grade 1 anterolisthesis of C5 on C6 and of C6 on C7. Disc space narrowing is noted at C3-C4 and C6-C7. Mild underlying facet disease is noted. Prevertebral soft tissues are within normal limits.  The thyroid gland is unremarkable in appearance. Prominent scarring is noted at the lung apices, with associated calcification. Mild calcification is seen at the carotid bifurcations bilaterally.  IMPRESSION: 1. No evidence of traumatic intracranial injury or fracture. 2. No evidence of acute fracture or subluxation along the cervical spine. 3. Moderate cortical volume loss and diffuse small vessel ischemic microangiopathy. Chronic ischemic change at the basal ganglia bilaterally. 4. Complete opacification of the right mastoid air cells. 5. Mild degenerative change along the cervical spine. 6. Prominent scarring at the lung apices, with associated  calcification. 7. Mild calcification at the carotid bifurcations bilaterally.   Electronically Signed   By: Roanna Raider M.D.   On: 11/19/2014 01:19     EKG Interpretation None      MDM   Final diagnoses:  Fall  Fall         Earley Favor, NP 11/19/14 0205  Loren Racer, MD 11/19/14 [id number]

## 2014-11-19 NOTE — ED Notes (Signed)
[facility]

## 2014-11-19 NOTE — ED Notes (Signed)
[facility].

## 2014-12-05 ENCOUNTER — Encounter (HOSPITAL_COMMUNITY): Payer: Self-pay | Admitting: *Deleted

## 2014-12-05 ENCOUNTER — Emergency Department (HOSPITAL_COMMUNITY): Payer: Medicare Other

## 2014-12-05 ENCOUNTER — Inpatient Hospital Stay (HOSPITAL_COMMUNITY)
Admission: EM | Admit: 2014-12-05 | Discharge: 2014-12-07 | DRG: 378 | Disposition: A | Discharge status: Skilled Nursing Facility | Payer: Medicare Other | Attending: Internal Medicine | Admitting: Internal Medicine

## 2014-12-05 DIAGNOSIS — F419 Anxiety disorder, unspecified: Secondary | ICD-10-CM

## 2014-12-05 DIAGNOSIS — J449 Chronic obstructive pulmonary disease, unspecified: Secondary | ICD-10-CM

## 2014-12-05 DIAGNOSIS — K219 Gastro-esophageal reflux disease without esophagitis: Secondary | ICD-10-CM | POA: Diagnosis present

## 2014-12-05 DIAGNOSIS — Z79899 Other long term (current) drug therapy: Secondary | ICD-10-CM

## 2014-12-05 DIAGNOSIS — K922 Gastrointestinal hemorrhage, unspecified: Secondary | ICD-10-CM | POA: Diagnosis present

## 2014-12-05 DIAGNOSIS — F0280 Dementia in other diseases classified elsewhere without behavioral disturbance: Secondary | ICD-10-CM

## 2014-12-05 DIAGNOSIS — D72829 Elevated white blood cell count, unspecified: Secondary | ICD-10-CM | POA: Diagnosis not present

## 2014-12-05 DIAGNOSIS — N289 Disorder of kidney and ureter, unspecified: Secondary | ICD-10-CM

## 2014-12-05 DIAGNOSIS — Z66 Do not resuscitate: Secondary | ICD-10-CM | POA: Diagnosis not present

## 2014-12-05 DIAGNOSIS — D62 Acute posthemorrhagic anemia: Secondary | ICD-10-CM | POA: Insufficient documentation

## 2014-12-05 DIAGNOSIS — K5731 Diverticulosis of large intestine without perforation or abscess with bleeding: Principal | ICD-10-CM | POA: Diagnosis present

## 2014-12-05 DIAGNOSIS — G309 Alzheimer's disease, unspecified: Secondary | ICD-10-CM | POA: Diagnosis present

## 2014-12-05 DIAGNOSIS — N179 Acute kidney failure, unspecified: Secondary | ICD-10-CM

## 2014-12-05 DIAGNOSIS — K5900 Constipation, unspecified: Secondary | ICD-10-CM | POA: Diagnosis present

## 2014-12-05 DIAGNOSIS — I10 Essential (primary) hypertension: Secondary | ICD-10-CM

## 2014-12-05 LAB — COMPREHENSIVE METABOLIC PANEL
ALT: 16 U/L (ref 14–54)
ANION GAP: 7 (ref 5–15)
AST: 23 U/L (ref 15–41)
Albumin: 3.6 g/dL (ref 3.5–5.0)
Alkaline Phosphatase: 63 U/L (ref 38–126)
BUN: 32 mg/dL — ABNORMAL HIGH (ref 6–20)
CO2: 25 mmol/L (ref 22–32)
CREATININE: 1.71 mg/dL — AB (ref 0.44–1.00)
Calcium: 9.1 mg/dL (ref 8.9–10.3)
Chloride: 109 mmol/L (ref 101–111)
GFR calc non Af Amer: 24 mL/min — ABNORMAL LOW (ref >60)
GFR, EST AFRICAN AMERICAN: 28 mL/min — AB (ref >60)
Glucose, Bld: 133 mg/dL — ABNORMAL HIGH (ref 65–99)
Potassium: 4.4 mmol/L (ref 3.5–5.1)
Sodium: 141 mmol/L (ref 135–145)
Total Bilirubin: 0.4 mg/dL (ref 0.3–1.2)
Total Protein: 6.3 g/dL — ABNORMAL LOW (ref 6.5–8.1)

## 2014-12-05 LAB — RETICULOCYTES
RBC.: 3.9 MIL/uL (ref 3.87–5.11)
RETIC COUNT ABSOLUTE: 50.7 10*3/uL (ref 19.0–186.0)
Retic Ct Pct: 1.3 % (ref 0.4–3.1)

## 2014-12-05 LAB — CBC WITH DIFFERENTIAL/PLATELET
BASOS PCT: 0 % (ref 0–1)
Basophils Absolute: 0 10*3/uL (ref 0.0–0.1)
EOS PCT: 0 % (ref 0–5)
Eosinophils Absolute: 0 10*3/uL (ref 0.0–0.7)
HEMATOCRIT: 36.5 % (ref 36.0–46.0)
HEMOGLOBIN: 11.6 g/dL — AB (ref 12.0–15.0)
Lymphocytes Relative: 5 % — ABNORMAL LOW (ref 12–46)
Lymphs Abs: 0.7 10*3/uL (ref 0.7–4.0)
MCH: 29.7 pg (ref 26.0–34.0)
MCHC: 31.8 g/dL (ref 30.0–36.0)
MCV: 93.6 fL (ref 78.0–100.0)
MONO ABS: 1 10*3/uL (ref 0.1–1.0)
MONOS PCT: 7 % (ref 3–12)
NEUTROS ABS: 12.3 10*3/uL — AB (ref 1.7–7.7)
Neutrophils Relative %: 88 % — ABNORMAL HIGH (ref 43–77)
PLATELETS: 218 10*3/uL (ref 150–400)
RBC: 3.9 MIL/uL (ref 3.87–5.11)
RDW: 14 % (ref 11.5–15.5)
WBC: 14 10*3/uL — AB (ref 4.0–10.5)

## 2014-12-05 LAB — APTT: APTT: 23 s — AB (ref 24–37)

## 2014-12-05 LAB — PROTIME-INR
INR: 1.07 (ref 0.00–1.49)
PROTHROMBIN TIME: 14.1 s (ref 11.6–15.2)

## 2014-12-05 LAB — TYPE AND SCREEN
ABO/RH(D): A NEG
Antibody Screen: NEGATIVE

## 2014-12-05 MED ORDER — FAMOTIDINE IN NACL 20-0.9 MG/50ML-% IV SOLN
20.0000 mg | INTRAVENOUS | Status: AC
  Administered 2014-12-05: 20 mg via INTRAVENOUS
  Filled 2014-12-05: qty 50

## 2014-12-05 MED ORDER — PANTOPRAZOLE SODIUM 40 MG IV SOLR
40.0000 mg | INTRAVENOUS | Status: AC
  Administered 2014-12-05: 40 mg via INTRAVENOUS
  Filled 2014-12-05: qty 40

## 2014-12-05 NOTE — H&P (Signed)
PCP:   Ron ParkerBOWEN,SAMUEL, MD   Chief Complaint:  GI bleeding  HPI: All history obtained from ER physician as this patient has severe dementia. She is sent here from nursing home because of GI bleeding. In the ER she is grossly bloody with semidark stool. Her hemoglobin is slightly diminished. There is no report of any history of GI bleeding. No NSAIDs on her MAR  Review of Systems:  Unable to obtain d/t patients dementia  Past Medical History: Past Medical History  Diagnosis Date  . GERD (gastroesophageal reflux disease)   . Anxiety   . Dementia   . Alzheimer disease   . COPD (chronic obstructive pulmonary disease)   . Hypertension   . Renal disorder    History reviewed. Unable to obtain d/t patients dementia  Medications: Prior to Admission medications   Medication Sig Start Date End Date Taking? Authorizing Provider  ciprofloxacin (CIPRO) 250 MG tablet Take 250 mg by mouth 2 (two) times daily.   Yes Historical Provider, MD  citalopram (CELEXA) 10 MG tablet Take 10 mg by mouth daily.   Yes Historical Provider, MD  ENSURE (ENSURE) Take 237 mLs by mouth 3 (three) times daily with meals.   Yes Historical Provider, MD  hydrocerin (EUCERIN) CREA Apply 1 application topically 2 (two) times daily.   Yes Historical Provider, MD  lisinopril (PRINIVIL,ZESTRIL) 10 MG tablet Take 10 mg by mouth daily.   Yes Historical Provider, MD  mirtazapine (REMERON) 30 MG tablet Take 30 mg by mouth at bedtime.   Yes Historical Provider, MD  potassium chloride (K-DUR,KLOR-CON) 10 MEQ tablet Take 10 mEq by mouth daily.   Yes Historical Provider, MD  potassium chloride 20 MEQ/15ML (10%) SOLN Take 10 mEq by mouth daily.   Yes Historical Provider, MD  risperiDONE (RISPERDAL) 0.25 MG tablet Take 0.25 mg by mouth daily.    Yes Historical Provider, MD  risperiDONE (RISPERDAL) 0.5 MG tablet Take 0.5 mg by mouth at bedtime.   Yes Historical Provider, MD  acetaminophen (TYLENOL) 500 MG tablet Take 500 mg by mouth  every 6 (six) hours as needed for mild pain.    Historical Provider, MD  ALPRAZolam Prudy Feeler(XANAX) 0.25 MG tablet Take 0.25 mg by mouth every 6 (six) hours as needed for anxiety (agitation). Not to exceed 6 tablets in 24 hours    Historical Provider, MD  alum & mag hydroxide-simeth (MAALOX/MYLANTA) 200-200-20 MG/5ML suspension Take 30 mLs by mouth every 6 (six) hours as needed for indigestion or heartburn.    Historical Provider, MD  azelastine (OPTIVAR) 0.05 % ophthalmic solution Place 1 drop into both eyes 2 (two) times daily.    Historical Provider, MD  bumetanide (BUMEX) 1 MG tablet Take 0.5 mg by mouth 2 (two) times a week. Monday and Thursday.    Historical Provider, MD  donepezil (ARICEPT) 10 MG tablet Take 10 mg by mouth at bedtime.    Historical Provider, MD  guaifenesin (ROBITUSSIN) 100 MG/5ML syrup Take 200 mg by mouth every 6 (six) hours as needed for cough.     Historical Provider, MD  levofloxacin (LEVAQUIN) 500 MG tablet Take 500 mg by mouth daily.    Historical Provider, MD  loperamide (IMODIUM) 2 MG capsule Take 2 mg by mouth as needed for diarrhea or loose stools (not to exceed 8 doses in 24 hours).    Historical Provider, MD  mineral oil enema One enema tomorrow if constipation persist.  May repeat daily for 2 days if needed Patient not taking: Reported on  12/05/2014 07/02/14   Elson Areas, PA-C  Neomycin-Bacitracin-Polymyxin (TRIPLE ANTIBIOTIC EX) Apply 1 application topically. Apply after cleaning with normal saline, cover with bandaid or gauze and secure with tape. Change as needed until healed for skin tears, abrasions, or minor irritations.    Historical Provider, MD  polyethylene glycol (MIRALAX) packet Take 17 g by mouth daily. Patient not taking: Reported on 12/05/2014 07/02/14   Elson Areas, PA-C  Vitamin D, Ergocalciferol, (DRISDOL) 50000 UNITS CAPS capsule Take 50,000 Units by mouth every Thursday.    Historical Provider, MD    Allergies:  No Known Allergies  Social  History:  reports that she has never smoked. She does not have any smokeless tobacco history on file. She reports that she does not drink alcohol or use illicit drugs.  Family History: Unable to obtain d/t patients dementia  Physical Exam: Filed Vitals:   12/05/14 2126  BP: 150/83  Pulse: 90  Temp: 98.5 F (36.9 C)  Resp: 18  SpO2: 100%    General:  Pleasantly demented female Eyes: PERRLA, pink conjunctiva, no scleral icterus ENT: dry oral mucosa, neck supple, no thyromegaly Lungs: clear to ascultation, no wheeze, no crackles, no use of accessory muscles Cardiovascular: regular rate and rhythm, no regurgitation, no gallops, no murmurs. No carotid bruits, no JVD Abdomen: soft, positive BS, non-tender, non-distended, no organomegaly, not an acute abdomen GU: not examined Neuro: unable to evaluate d/t patients dementia Musculoskeletal: unable to evaluate d/t patients dementia Skin: no rash, no subcutaneous crepitation, no decubitus Psych: demented female   Labs on Admission:   Recent Labs  12/05/14 2136  NA 141  K 4.4  CL 109  CO2 25  GLUCOSE 133*  BUN 32*  CREATININE 1.71*  CALCIUM 9.1    Recent Labs  12/05/14 2136  AST 23  ALT 16  ALKPHOS 63  BILITOT 0.4  PROT 6.3*  ALBUMIN 3.6   No results for input(s): LIPASE, AMYLASE in the last 72 hours.  Recent Labs  12/05/14 2136  WBC 14.0*  NEUTROABS 12.3*  HGB 11.6*  HCT 36.5  MCV 93.6  PLT 218   No results for input(s): CKTOTAL, CKMB, CKMBINDEX, TROPONINI in the last 72 hours. Invalid input(s): POCBNP No results for input(s): DDIMER in the last 72 hours. No results for input(s): HGBA1C in the last 72 hours. No results for input(s): CHOL, HDL, LDLCALC, TRIG, CHOLHDL, LDLDIRECT in the last 72 hours. No results for input(s): TSH, T4TOTAL, T3FREE, THYROIDAB in the last 72 hours.  Invalid input(s): FREET3  Recent Labs  12/05/14 2136  RETICCTPCT 1.3    Micro Results: No results found for this or any  previous visit (from the past 240 hour(s)).   Radiological Exams on Admission: No results found.  Assessment/Plan Present on Admission:  . Acute GI bleeding -Admit to MedSurg -Serial H&H -Nothing by mouth, IV fluid hydration -IV Protonix ordered  -GI consulted by ER physician  Acute kidney injury  -IV fluid hydration, beat repeat BMP in a.m.  -Bumex d/ced, lisinopril held  Leukocytosis -Unclear etiology -Urinalysis and chest x-ray ordered  -For now no antibiotic ordered  Dementia -Patient unable to give any history due to severe dementia  Hypertension -Lisinopril held, when necessary blood pressure medications [place] COPD Anxiety -Stable home medications resumed   [name] 12/05/2014, 11:27 PM

## 2014-12-05 NOTE — ED Notes (Signed)
[facility] had blood in her stool when changing her diaper today. Pt is alert to verbal, oriented to name. Staff state pt is at baseline. VSS en route to hospital. CBG 225.

## 2014-12-05 NOTE — ED Provider Notes (Signed)
CSN: [id number]     Arrival date & time 12/05/14  2123 History   First MD Initiated Contact with Patient 12/05/14 2126     Chief Complaint  Patient presents with  . Rectal Bleeding     (Consider location/radiation/quality/duration/timing/severity/associated sxs/prior Treatment) HPI Comments: 81 year old female with no known gastrointestinal history other than acid reflux, presents to the hospital with complaint of gastrointestinal bleeding. She has dementia, she cannot answer questions, nurses at her nursing home reported a large amount of reddish stool in the diaper. She has not had fevers vomiting or any other complaints.  Patient is a 81 y.o. female presenting with hematochezia. The history is provided by the patient.  Rectal Bleeding   Past Medical History  Diagnosis Date  . GERD (gastroesophageal reflux disease)   . Anxiety   . Dementia   . Alzheimer disease   . COPD (chronic obstructive pulmonary disease)   . Hypertension   . Renal disorder    History reviewed. No pertinent past surgical history. No family history on file. History  Substance Use Topics  . Smoking status: Never Smoker   . Smokeless tobacco: Not on file  . Alcohol Use: No   OB History    No data available     Review of Systems  Unable to perform ROS: Dementia  Gastrointestinal: Positive for hematochezia.      Allergies  Review of patient's allergies indicates no known allergies.  Home Medications   Prior to Admission medications   Medication Sig Start Date End Date Taking? Authorizing Provider  ciprofloxacin (CIPRO) 250 MG tablet Take 250 mg by mouth 2 (two) times daily.   Yes Historical Provider, MD  citalopram (CELEXA) 10 MG tablet Take 10 mg by mouth daily.   Yes Historical Provider, MD  ENSURE (ENSURE) Take 237 mLs by mouth 3 (three) times daily with meals.   Yes Historical Provider, MD  hydrocerin (EUCERIN) CREA Apply 1 application topically 2 (two) times daily.   Yes Historical  Provider, MD  lisinopril (PRINIVIL,ZESTRIL) 10 MG tablet Take 10 mg by mouth daily.   Yes Historical Provider, MD  mirtazapine (REMERON) 30 MG tablet Take 30 mg by mouth at bedtime.   Yes Historical Provider, MD  potassium chloride (K-DUR,KLOR-CON) 10 MEQ tablet Take 10 mEq by mouth daily.   Yes Historical Provider, MD  potassium chloride 20 MEQ/15ML (10%) SOLN Take 10 mEq by mouth daily.   Yes Historical Provider, MD  risperiDONE (RISPERDAL) 0.25 MG tablet Take 0.25 mg by mouth daily.    Yes Historical Provider, MD  risperiDONE (RISPERDAL) 0.5 MG tablet Take 0.5 mg by mouth at bedtime.   Yes Historical Provider, MD  acetaminophen (TYLENOL) 500 MG tablet Take 500 mg by mouth every 6 (six) hours as needed for mild pain.    Historical Provider, MD  ALPRAZolam Prudy Feeler) 0.25 MG tablet Take 0.25 mg by mouth every 6 (six) hours as needed for anxiety (agitation). Not to exceed 6 tablets in 24 hours    Historical Provider, MD  alum & mag hydroxide-simeth (MAALOX/MYLANTA) 200-200-20 MG/5ML suspension Take 30 mLs by mouth every 6 (six) hours as needed for indigestion or heartburn.    Historical Provider, MD  azelastine (OPTIVAR) 0.05 % ophthalmic solution Place 1 drop into both eyes 2 (two) times daily.    Historical Provider, MD  bumetanide (BUMEX) 1 MG tablet Take 0.5 mg by mouth 2 (two) times a week. Monday and Thursday.    Historical Provider, MD  donepezil (ARICEPT) 10 MG  tablet Take 10 mg by mouth at bedtime.    Historical Provider, MD  guaifenesin (ROBITUSSIN) 100 MG/5ML syrup Take 200 mg by mouth every 6 (six) hours as needed for cough.     Historical Provider, MD  levofloxacin (LEVAQUIN) 500 MG tablet Take 500 mg by mouth daily.    Historical Provider, MD  loperamide (IMODIUM) 2 MG capsule Take 2 mg by mouth as needed for diarrhea or loose stools (not to exceed 8 doses in 24 hours).    Historical Provider, MD  mineral oil enema One enema tomorrow if constipation persist.  May repeat daily for 2 days if  needed Patient not taking: Reported on 12/05/2014 07/02/14   Elson Areas, PA-C  Neomycin-Bacitracin-Polymyxin (TRIPLE ANTIBIOTIC EX) Apply 1 application topically. Apply after cleaning with normal saline, cover with bandaid or gauze and secure with tape. Change as needed until healed for skin tears, abrasions, or minor irritations.    Historical Provider, MD  polyethylene glycol (MIRALAX) packet Take 17 g by mouth daily. Patient not taking: Reported on 12/05/2014 07/02/14   Elson Areas, PA-C  Vitamin D, Ergocalciferol, (DRISDOL) 50000 UNITS CAPS capsule Take 50,000 Units by mouth every Thursday.    Historical Provider, MD   BP 150/83 mmHg  Pulse 90  Temp(Src) 98.5 F (36.9 C)  Resp 18  SpO2 100% Physical Exam  Constitutional: She appears well-developed and well-nourished. No distress.  HENT:  Head: Normocephalic and atraumatic.  Mouth/Throat: Oropharynx is clear and moist. No oropharyngeal exudate.  Eyes: Conjunctivae and EOM are normal. Pupils are equal, round, and reactive to light. Right eye exhibits no discharge. Left eye exhibits no discharge. No scleral icterus.  Neck: Normal range of motion. Neck supple. No JVD present. No thyromegaly present.  Cardiovascular: Normal rate, regular rhythm, normal heart sounds and intact distal pulses.  Exam reveals no gallop and no friction rub.   No murmur heard. Pulmonary/Chest: Effort normal and breath sounds normal. No respiratory distress. She has no wheezes. She has no rales.  Abdominal: Soft. Bowel sounds are normal. She exhibits no distension and no mass. There is no tenderness.  Genitourinary:  Rectal exam with voluminous amounts of bloody stool in the diaper and the rectal vault. No fissures or hemorrhoids present. Chaperone present  Musculoskeletal: Normal range of motion. She exhibits no edema or tenderness.  Lymphadenopathy:    She has no cervical adenopathy.  Neurological: She is alert. Coordination normal.  Skin: Skin is warm and  dry. No rash noted. No erythema.  Psychiatric: She has a normal mood and affect. Her behavior is normal.  Nursing note and vitals reviewed.   ED Course  Procedures (including critical care time) Labs Review Labs Reviewed  CBC WITH DIFFERENTIAL/PLATELET - Abnormal; Notable for the following:    WBC 14.0 (*)    Hemoglobin 11.6 (*)    Neutrophils Relative % 88 (*)    Neutro Abs 12.3 (*)    Lymphocytes Relative 5 (*)    All other components within normal limits  COMPREHENSIVE METABOLIC PANEL - Abnormal; Notable for the following:    Glucose, Bld 133 (*)    BUN 32 (*)    Creatinine, Ser 1.71 (*)    Total Protein 6.3 (*)    GFR calc non Af Amer 24 (*)    GFR calc Af Amer 28 (*)    All other components within normal limits  APTT - Abnormal; Notable for the following:    aPTT 23 (*)    All  other components within normal limits  PROTIME-INR  RETICULOCYTES  VITAMIN B12  FOLATE  IRON AND TIBC  FERRITIN  TYPE AND SCREEN  ABO/RH    Imaging Review No results found.    MDM   Final diagnoses:  Lower GI bleed  Renal insufficiency    Acute gastrointestinal bleeding, likely lower, will give medications, fluids, labs, anticipate admission. The patient is not on anticoagulate.  GI bleeding present, hemoglobin slightly lower than normal, BUN is elevated compared to normal as is renal function at creatinine of 1.7. Discussed with the hospitalist will see the patient in the emergency department for admission.  Will discussed with gastroenterology to obtain consultation. The patient has normal vital signs, she appears hemodynamically stable.  Meds given in ED:  Medications  famotidine (PEPCID) IVPB 20 mg premix (20 mg Intravenous New Bag/Given 12/05/14 2158)  pantoprazole (PROTONIX) injection 40 mg (40 mg Intravenous Given 12/05/14 2159)      Eber HongBrian Althia Egolf, MD 12/05/14 2321

## 2014-12-05 NOTE — ED Notes (Signed)
Bed: ZO10WA25 Expected date:  Expected time:  Means of arrival:  Comments: [age] yoF Bloody Stools

## 2014-12-06 DIAGNOSIS — D62 Acute posthemorrhagic anemia: Secondary | ICD-10-CM | POA: Diagnosis not present

## 2014-12-06 DIAGNOSIS — G309 Alzheimer's disease, unspecified: Secondary | ICD-10-CM | POA: Diagnosis present

## 2014-12-06 DIAGNOSIS — F419 Anxiety disorder, unspecified: Secondary | ICD-10-CM | POA: Diagnosis present

## 2014-12-06 DIAGNOSIS — S51012A Laceration without foreign body of left elbow, initial encounter: Secondary | ICD-10-CM | POA: Diagnosis not present

## 2014-12-06 DIAGNOSIS — Y92128 Other place in nursing home as the place of occurrence of the external cause: Secondary | ICD-10-CM | POA: Diagnosis not present

## 2014-12-06 DIAGNOSIS — N179 Acute kidney failure, unspecified: Secondary | ICD-10-CM | POA: Diagnosis present

## 2014-12-06 DIAGNOSIS — J449 Chronic obstructive pulmonary disease, unspecified: Secondary | ICD-10-CM | POA: Diagnosis present

## 2014-12-06 DIAGNOSIS — K219 Gastro-esophageal reflux disease without esophagitis: Secondary | ICD-10-CM | POA: Diagnosis present

## 2014-12-06 DIAGNOSIS — Y9389 Activity, other specified: Secondary | ICD-10-CM | POA: Diagnosis not present

## 2014-12-06 DIAGNOSIS — Y998 Other external cause status: Secondary | ICD-10-CM | POA: Diagnosis not present

## 2014-12-06 DIAGNOSIS — Z79899 Other long term (current) drug therapy: Secondary | ICD-10-CM | POA: Diagnosis not present

## 2014-12-06 DIAGNOSIS — F4322 Adjustment disorder with anxiety: Secondary | ICD-10-CM

## 2014-12-06 DIAGNOSIS — Z66 Do not resuscitate: Secondary | ICD-10-CM | POA: Diagnosis not present

## 2014-12-06 DIAGNOSIS — S51001A Unspecified open wound of right elbow, initial encounter: Secondary | ICD-10-CM | POA: Diagnosis not present

## 2014-12-06 DIAGNOSIS — S59911A Unspecified injury of right forearm, initial encounter: Secondary | ICD-10-CM | POA: Diagnosis present

## 2014-12-06 DIAGNOSIS — D72829 Elevated white blood cell count, unspecified: Secondary | ICD-10-CM | POA: Diagnosis present

## 2014-12-06 DIAGNOSIS — K922 Gastrointestinal hemorrhage, unspecified: Secondary | ICD-10-CM | POA: Diagnosis present

## 2014-12-06 DIAGNOSIS — K5731 Diverticulosis of large intestine without perforation or abscess with bleeding: Secondary | ICD-10-CM | POA: Diagnosis present

## 2014-12-06 DIAGNOSIS — I10 Essential (primary) hypertension: Secondary | ICD-10-CM | POA: Diagnosis present

## 2014-12-06 DIAGNOSIS — K5900 Constipation, unspecified: Secondary | ICD-10-CM | POA: Diagnosis present

## 2014-12-06 DIAGNOSIS — Z87448 Personal history of other diseases of urinary system: Secondary | ICD-10-CM | POA: Diagnosis not present

## 2014-12-06 DIAGNOSIS — F0280 Dementia in other diseases classified elsewhere without behavioral disturbance: Secondary | ICD-10-CM | POA: Diagnosis present

## 2014-12-06 DIAGNOSIS — W06XXXA Fall from bed, initial encounter: Secondary | ICD-10-CM | POA: Diagnosis not present

## 2014-12-06 LAB — IRON AND TIBC
IRON: 30 ug/dL (ref 28–170)
Saturation Ratios: 10 % — ABNORMAL LOW (ref 10.4–31.8)
TIBC: 309 ug/dL (ref 250–450)
UIBC: 279 ug/dL

## 2014-12-06 LAB — BASIC METABOLIC PANEL
ANION GAP: 5 (ref 5–15)
BUN: 30 mg/dL — ABNORMAL HIGH (ref 6–20)
CO2: 26 mmol/L (ref 22–32)
CREATININE: 1.44 mg/dL — AB (ref 0.44–1.00)
Calcium: 8.7 mg/dL — ABNORMAL LOW (ref 8.9–10.3)
Chloride: 110 mmol/L (ref 101–111)
GFR calc Af Amer: 34 mL/min — ABNORMAL LOW (ref >60)
GFR calc non Af Amer: 30 mL/min — ABNORMAL LOW (ref >60)
Glucose, Bld: 102 mg/dL — ABNORMAL HIGH (ref 65–99)
POTASSIUM: 4.5 mmol/L (ref 3.5–5.1)
SODIUM: 141 mmol/L (ref 135–145)

## 2014-12-06 LAB — FOLATE: Folate: 21.9 ng/mL (ref >5.9)

## 2014-12-06 LAB — HEMOGLOBIN AND HEMATOCRIT, BLOOD
HCT: 34.1 % — ABNORMAL LOW (ref 36.0–46.0)
HCT: 32.3 % — ABNORMAL LOW (ref 36.0–46.0)
HCT: 32.6 % — ABNORMAL LOW (ref 36.0–46.0)
HEMOGLOBIN: 10.3 g/dL — AB (ref 12.0–15.0)
Hemoglobin: 10.9 g/dL — ABNORMAL LOW (ref 12.0–15.0)
Hemoglobin: 10.2 g/dL — ABNORMAL LOW (ref 12.0–15.0)

## 2014-12-06 LAB — URINALYSIS, ROUTINE W REFLEX MICROSCOPIC
Bilirubin Urine: NEGATIVE
GLUCOSE, UA: NEGATIVE mg/dL
Ketones, ur: NEGATIVE mg/dL
Nitrite: NEGATIVE
Protein, ur: NEGATIVE mg/dL
Specific Gravity, Urine: 1.014 (ref 1.005–1.030)
Urobilinogen, UA: 0.2 mg/dL (ref 0.0–1.0)
pH: 5.5 (ref 5.0–8.0)

## 2014-12-06 LAB — URINE MICROSCOPIC-ADD ON

## 2014-12-06 LAB — VITAMIN B12: Vitamin B-12: 266 pg/mL (ref 180–914)

## 2014-12-06 LAB — MRSA PCR SCREENING: MRSA by PCR: NEGATIVE

## 2014-12-06 LAB — FERRITIN: FERRITIN: 45 ng/mL (ref 11–307)

## 2014-12-06 LAB — ABO/RH: ABO/RH(D): A NEG

## 2014-12-06 MED ORDER — RISPERIDONE 0.25 MG PO TABS
0.2500 mg | ORAL_TABLET | Freq: Every day | ORAL | Status: DC
  Administered 2014-12-06 – 2014-12-07 (×2): 0.25 mg via ORAL
  Filled 2014-12-06 (×2): qty 1

## 2014-12-06 MED ORDER — CITALOPRAM HYDROBROMIDE 10 MG PO TABS
10.0000 mg | ORAL_TABLET | Freq: Every day | ORAL | Status: DC
  Administered 2014-12-06 – 2014-12-07 (×2): 10 mg via ORAL
  Filled 2014-12-06 (×2): qty 1

## 2014-12-06 MED ORDER — PANTOPRAZOLE SODIUM 40 MG IV SOLR
40.0000 mg | INTRAVENOUS | Status: DC
  Administered 2014-12-06: 40 mg via INTRAVENOUS
  Filled 2014-12-06 (×2): qty 40

## 2014-12-06 MED ORDER — ENSURE ENLIVE PO LIQD
237.0000 mL | Freq: Three times a day (TID) | ORAL | Status: DC
  Administered 2014-12-06 – 2014-12-07 (×2): 237 mL via ORAL

## 2014-12-06 MED ORDER — SODIUM CHLORIDE 0.9 % IV SOLN
INTRAVENOUS | Status: DC
  Administered 2014-12-06 – 2014-12-07 (×2): via INTRAVENOUS

## 2014-12-06 NOTE — Progress Notes (Signed)
Clinical Social Work  CSW received referral due to patient being admitted from a facility. Conflicting information placed in chart on whether [facility] was [facility] or [facility]. CSW went to room to meet with patient but patient disoriented and unable to fully participate in assessment. CSW attempted to call son but had to leave a message. CSW called [facility] who reports patient is not a current patient at their facility. CSW tried call [facility] x3 but no answer. CSW completed FL2 and will continue to follow. Full assessment to follow.  HendersonHolly Bunny Lowdermilk, [place]CSW [phone]

## 2014-12-06 NOTE — Care Management Note (Signed)
Case Management Note  Patient Details  Name: Mariana ArnGeraldine Oyster MRN: [id number] Date of Birth: 03/19/1917  Subjective/Objective:                 Lower gi bleed   Action/Plan: snf   Expected Discharge Date:                  Expected Discharge Plan:  Skilled Nursing Facility  In-House Referral:  Clinical Social Work  Discharge planning Services  CM Consult  Post Acute Care Choice:  NA Choice offered to:  NA  DME Arranged:  N/A DME Agency:  NA  HH Arranged:  NA HH Agency:  NA  Status of Service:  In process, will continue to follow  Medicare Important Message Given:    Date Medicare IM Given:    Medicare IM give by:    Date Additional Medicare IM Given:    Additional Medicare Important Message give by:     If discussed at Long Length of Stay Meetings, dates discussed:    Additional Comments:  Golda AcreDavis, Vickee Mormino Lynn, RN 12/06/2014, 10:41 AM

## 2014-12-06 NOTE — Progress Notes (Addendum)
Patient ID: Sherri ArnGeraldine Fornes, female   DOB: 05/30/1916, 81 y.o.   MRN: [id number] [facility] PROGRESS NOTE  [name] WUJ:[id number] DOB: 06/28/1916 DOA: 12/05/2014 PCP: Ron ParkerBOWEN,SAMUEL, MD  Brief narrative:    81 -year-old female with past medical history of dementia from nursing home, COPD, hypertension who presented with blood found in her diaper. On admission, in ED digital exam showed copious blood but no further bleeding since then. Patient was seen by GI in consultation and plan is for observation at this time.  Anticipated discharge: Likely by 12/07/2014 if hemoglobin remains stable and no further bleed.  Assessment/Plan:    Principal Problem: Lower GI bleed / acute blood loss anemia / acute gastrointestinal hemorrhage - Probably diverticular type of bleed - Hemoglobin remains stable at 10.2 - No further bleed reported - Continue PPI therapy - GI has seen the patient in consultation and recommends observation  Active Problems:   Anxiety - Stable - Continue Celexa and risperidone    Alzheimer's dementia - Stable - No behavioral disturbance - Continue risperidone - Resume Aricept    Acute renal failure - Likely from prerenal etiology, GI bleed versus lisinopril  - Lisinopril placed on hold due to ARF - Creatinine is improving with IV fluids from 1.7 on admission down to 1.4 today    Essential hypertension - Lisinopril on hold because of acute renal failure - Blood pressure is stable, 140/76    DVT Prophylaxis  - SCD's bilaterally due to risk of bleeding    Code Status: DNR/DNI Family Communication:  Family not at the bedside Disposition Plan: to SNF likely 12/07/2014  IV access:  Peripheral IV  Procedures and diagnostic studies:    Dg Chest Port 1 View 12/06/2014   Mild cardiomegaly and COPD.     Medical Consultants:  Gastroenterology, Dr. Bernette RedbirdBuccini Robert   Other Consultants:  Physical therapy Nutrition  IAnti-Infectives:   None     Manson PasseyEVINE, Oksana Deberry, MD  [facility] Pager [phone]  Time spent in minutes: 15 minutes  If 7PM-7AM, please contact night-coverage [email or web address] Password [email or web address] 12/06/2014, 10:44 AM   LOS: 0 days    HPI/Subjective: No acute overnight events. Patient reports no nausea, vomiting.   Objective: Filed Vitals:   12/06/14 0102 12/06/14 0130 12/06/14 0600 12/06/14 0700  BP: 150/83 144/45  140/76  Pulse: 80 76  80  Temp:  98.4 F (36.9 C)  98.6 F (37 C)  TempSrc:  Oral  Oral  Resp: 12 16  16   Height:    5\' 5"  (1.651 m)  Weight:   51.7 kg (113 lb 15.7 oz)   SpO2: 98% 100%  100%    Intake/Output Summary (Last 24 hours) at 12/06/14 1044 Last data filed at 12/06/14 0900  Gross per 24 hour  Intake 241.25 ml  Output      0 ml  Net 241.25 ml    Exam:   General:  Pt is alert,  not in acute distress  Cardiovascular: Rate controlled, S1/S2 (+)  Respiratory: Clear to auscultation bilaterally, no wheezing, no crackles, no rhonchi  Abdomen: Soft, non tender, non distended, bowel sounds present  Extremities: No edema, pulses DP and PT palpable bilaterally  Neuro: Grossly nonfocal  Data Reviewed: Basic Metabolic Panel:  Recent Labs Lab 12/05/14 2136 12/06/14 0225  NA 141 141  K 4.4 4.5  CL 109 110  CO2 25 26  GLUCOSE 133* 102*  BUN 32* 30*  CREATININE 1.71* 1.44*  CALCIUM 9.1 8.7*   Liver Function Tests:  Recent Labs Lab 12/05/14 2136  AST 23  ALT 16  ALKPHOS 63  BILITOT 0.4  PROT 6.3*  ALBUMIN 3.6   No results for input(s): LIPASE, AMYLASE in the last 168 hours. No results for input(s): AMMONIA in the last 168 hours. CBC:  Recent Labs Lab 12/05/14 2136 12/06/14 0123 12/06/14 0925  WBC 14.0*  --   --   NEUTROABS 12.3*  --   --   HGB 11.6* 10.9* 10.2*  HCT 36.5 34.1* 32.3*  MCV 93.6  --   --   PLT 218  --   --    Cardiac Enzymes: No results for input(s): CKTOTAL, CKMB, CKMBINDEX, TROPONINI in the last 168 hours. BNP: Invalid input(s):  POCBNP CBG: No results for input(s): GLUCAP in the last 168 hours.  Recent Results (from the past 240 hour(s))  MRSA PCR Screening     Status: None   Collection Time: 12/06/14  1:38 AM  Result Value Ref Range Status   MRSA by PCR NEGATIVE NEGATIVE Final     Scheduled Meds: . citalopram  10 mg Oral Daily  . feeding supplement (ENSURE ENLIVE)  237 mL Oral TID BM  . pantoprazole (PROTONIX) IV  40 mg Intravenous Q24H  . risperiDONE  0.25 mg Oral Daily   Continuous Infusions: . sodium chloride 75 mL/hr at 12/06/14 0127

## 2014-12-06 NOTE — Progress Notes (Signed)
Date:  December 06, 2014 U.R. performed for needs and level of care. Will continue to follow for Case Management needs.  Rhonda Davis, RN, BSN, CCM   [phone] 

## 2014-12-06 NOTE — Progress Notes (Signed)
Initial Nutrition Assessment  DOCUMENTATION CODES:   Not applicable  INTERVENTION:  - Will monitor for need for supplements if diet able to be advanced - RD will continue to monitor for needs  NUTRITION DIAGNOSIS:   Inadequate oral intake related to inability to eat as evidenced by NPO status.  GOAL:   Patient will meet greater than or equal to 90% of their needs  MONITOR:   Diet advancement, Weight trends, Labs  REASON FOR ASSESSMENT:   Malnutrition Screening Tool, Consult Diet education  ASSESSMENT:  Per H&P, All history obtained from ER physician as this patient has severe dementia. She is sent here from nursing home because of GI bleeding. In the ER she is grossly bloody with semidark stool. Her hemoglobin is slightly diminished. There is no report of any history of GI bleeding. No NSAIDs on her MAR.  Pt seen for MST and consult for education. Consult deferred at this time as pt with hx of dementia, no family present, and she resides in a facility. BMI indicates normal weight status.   She has been NPO since admission, unable to meet needs. No muscle or fat wasting noted at this time. Per weight hx review, pt has gained 13 lbs in the past 7 months which is beneficial. Medications reviewed. Labs reviewed; BUN/creatinine elevated but trending down, Ca: 8.7 mg/dL, GFR: 30.   Diet Order:  Diet regular Room service appropriate?: Yes; Fluid consistency:: Thin  Skin:  Reviewed, no issues  Last BM:  7/19  Height:   Ht Readings from Last 1 Encounters:  12/06/14 5\' 5"  (1.651 m)    Weight:   Wt Readings from Last 1 Encounters:  12/06/14 113 lb 15.7 oz (51.7 kg)    Ideal Body Weight:  56.82 kg (kg)  Wt Readings from Last 10 Encounters:  12/06/14 113 lb 15.7 oz (51.7 kg)  06/08/14 100 lb (45.36 kg)  06/07/14 100 lb (45.36 kg)    BMI:  Body mass index is 18.97 kg/(m^2).  Estimated Nutritional Needs:   Kcal:  1100-1300  Protein:  50-60 grams  Fluid:  1.7-2  L/day  EDUCATION NEEDS:   Education needs no appropriate at this time    Trenton GammonJessica Dontea Corlew, RD, LDN Inpatient Clinical Dietitian Pager # [phone] After hours/weekend pager # [phone]

## 2014-12-06 NOTE — Clinical Social Work Note (Signed)
Clinical Social Work Assessment  Patient Details  Name: Mariana ArnGeraldine Montilla MRN: [id number] Date of Birth: 04/20/1917  Date of referral:  12/06/14               Reason for consult:  Discharge Planning                Permission sought to share information with:  Family Supports Permission granted to share information::  No  Name::     Arboriculturistodney  Agency::     Relationship::  son  Contact Information:     Housing/Transportation Living arrangements for the past 2 months:  [facility] Source of Information:  Facility Patient Interpreter Needed:  None Criminal Activity/Legal Involvement Pertinent to Current Situation/Hospitalization:  No - Comment as needed Significant Relationships:  Adult Children Lives with:  Facility Resident Do you feel safe going back to the place where you live?  Yes Need for family participation in patient care:  Yes (Comment) (Patient unable to participate in assessment)  Care giving concerns:  Patient has been living at ALF. At this time, plan is for patient to return to ALF at DC.   Social Worker assessment / plan:  CSW following patient due to being admitted from a facility. CSW was able to speak with Nehemiah Sett[facility]Brooke [facility] who confirms that patient is a resident and able to return at DC. Per chart review, patient might be able to DC back to ALF tomorrow. CSW shared this information with ALF who reports no face-to-face needs to be completed but that CSW could fax FL2 and DC summary to [phone].  ALF reports that patient had a recent change in ambulation status and begun leaning to one side when ambulating. Patient requires at least 1 person assist with ambulation and uses a walker. Staff at ALF assists with dressing, bathing, and tolieting. ALF is able to accept patient back as long as she is at baseline and [facility].  CSW completed FL2 and still awaiting return call from son.  Employment status:  Retired Glass blower/designernsurance  information:  [place], [place] Recommend[place]:  Not assessed at this time Information / Referral to community resources:  Other (Comment Required) (To return to ALF)  Patient/Family's Response to care:  Patient unable to participate. No family participation at this time.  Patient/Family's Understanding of and Emotional Response to Diagnosis, Current Treatment, and Prognosis:  Facility knowledgeable and able to provide background information for patient. Facility open to patient returning and feel they can meet patient's needs at this time.  Emotional Assessment Appearance:  Appears stated age Attitude/Demeanor/Rapport:  Lethargic Affect (typically observed):  Unable to Assess Orientation:  Oriented to Self Alcohol / Substance use:  Not Applicable Psych involvement (Current and /or in the community):  No (Comment)  Discharge Needs  Concerns to be addressed:  No discharge needs identified Readmission within the last 30 days:  No Current discharge risk:  None Barriers to Discharge:  Continued Medical Work up   Levi Strausserber, Lowanda Cashaw Marie, LCSW 12/06/2014, 12:04 PM [phone]

## 2014-12-06 NOTE — Consult Note (Signed)
Referring Provider: Dr. Manson Passey Primary Care Physician:  Ron Parker, MD Primary Gastroenterologist:  None (unassigned)  Reason for Consultation:  Hematochezia  HPI: [name] is a 81 y.o. female with significant dementia, transferred from [facility] nursing home, where she has resided for the past several years, because of an apparently solitary episode of large volume hematochezia in her diaper. In the emergency room, digital exam showed copious blood. However, she has not had further bleeding overnight and her hemoglobin has remained essentially stable. Her current level this morning is 10.2, as compared to a previous level of 12.7 about 6 months ago.  From talking with her patient's son on the telephone [name]), it does not sound as though the patient has significant constipation at the nursing home to have caused stercoral ulceration. She does have a past history of constipation but that is managed with MiraLAX at the nursing home. She is not on aspirin or other blood thinners. Her BUN on admission was 30, as compared to a baseline of 20, but there has been a corresponding increase in her creatinine as well during that period of time.  A CT scan several years ago did show some sigmoid diverticulosis.   Past Medical History  Diagnosis Date  . GERD (gastroesophageal reflux disease)   . Anxiety   . Dementia   . Alzheimer disease   . COPD (chronic obstructive pulmonary disease)   . Hypertension   . Renal disorder     History reviewed. No pertinent past surgical history.  Prior to Admission medications   Medication Sig Start Date End Date Taking? Authorizing Provider  ciprofloxacin (CIPRO) 250 MG tablet Take 250 mg by mouth 2 (two) times daily.   Yes Historical Provider, MD  citalopram (CELEXA) 10 MG tablet Take 10 mg by mouth daily.   Yes Historical Provider, MD  ENSURE (ENSURE) Take 237 mLs by mouth 3 (three) times daily with meals.    Yes Historical Provider, MD  hydrocerin (EUCERIN) CREA Apply 1 application topically 2 (two) times daily.   Yes Historical Provider, MD  lisinopril (PRINIVIL,ZESTRIL) 10 MG tablet Take 10 mg by mouth daily.   Yes Historical Provider, MD  mirtazapine (REMERON) 30 MG tablet Take 30 mg by mouth at bedtime.   Yes Historical Provider, MD  potassium chloride (K-DUR,KLOR-CON) 10 MEQ tablet Take 10 mEq by mouth daily.   Yes Historical Provider, MD  potassium chloride 20 MEQ/15ML (10%) SOLN Take 10 mEq by mouth daily.   Yes Historical Provider, MD  risperiDONE (RISPERDAL) 0.25 MG tablet Take 0.25 mg by mouth daily.    Yes Historical Provider, MD  risperiDONE (RISPERDAL) 0.5 MG tablet Take 0.5 mg by mouth at bedtime.   Yes Historical Provider, MD  acetaminophen (TYLENOL) 500 MG tablet Take 500 mg by mouth every 6 (six) hours as needed for mild pain.    Historical Provider, MD  ALPRAZolam Prudy Feeler) 0.25 MG tablet Take 0.25 mg by mouth every 6 (six) hours as needed for anxiety (agitation). Not to exceed 6 tablets in 24 hours    Historical Provider, MD  alum & mag hydroxide-simeth (MAALOX/MYLANTA) 200-200-20 MG/5ML suspension Take 30 mLs by mouth every 6 (six) hours as needed for indigestion or heartburn.    Historical Provider, MD  azelastine (OPTIVAR) 0.05 % ophthalmic solution Place 1 drop into both eyes 2 (two) times daily.    Historical Provider, MD  bumetanide (BUMEX) 1 MG tablet Take 0.5 mg by mouth 2 (two) times a week.  Monday and Thursday.    Historical Provider, MD  donepezil (ARICEPT) 10 MG tablet Take 10 mg by mouth at bedtime.    Historical Provider, MD  guaifenesin (ROBITUSSIN) 100 MG/5ML syrup Take 200 mg by mouth every 6 (six) hours as needed for cough.     Historical Provider, MD  levofloxacin (LEVAQUIN) 500 MG tablet Take 500 mg by mouth daily.    Historical Provider, MD  loperamide (IMODIUM) 2 MG capsule Take 2 mg by mouth as needed for diarrhea or loose stools (not to exceed 8 doses in 24  hours).    Historical Provider, MD  mineral oil enema One enema tomorrow if constipation persist.  May repeat daily for 2 days if needed Patient not taking: Reported on 12/05/2014 07/02/14   Elson Areas, PA-C  Neomycin-Bacitracin-Polymyxin (TRIPLE ANTIBIOTIC EX) Apply 1 application topically. Apply after cleaning with normal saline, cover with bandaid or gauze and secure with tape. Change as needed until healed for skin tears, abrasions, or minor irritations.    Historical Provider, MD  polyethylene glycol (MIRALAX) packet Take 17 g by mouth daily. Patient not taking: Reported on 12/05/2014 07/02/14   Elson Areas, PA-C  Vitamin D, Ergocalciferol, (DRISDOL) 50000 UNITS CAPS capsule Take 50,000 Units by mouth every Thursday.    Historical Provider, MD    Current Facility-Administered Medications  Medication Dose Route Frequency Provider Last Rate Last Dose  . 0.9 %  sodium chloride infusion   Intravenous Continuous Debby Crosley, MD 75 mL/hr at 12/06/14 0127    . citalopram (CELEXA) tablet 10 mg  10 mg Oral Daily Debby Crosley, MD   10 mg at 12/06/14 0900  . feeding supplement (ENSURE ENLIVE) (ENSURE ENLIVE) liquid 237 mL  237 mL Oral TID BM Debby Crosley, MD   237 mL at 12/06/14 1000  . pantoprazole (PROTONIX) injection 40 mg  40 mg Intravenous Q24H Debby Crosley, MD      . risperiDONE (RISPERDAL) tablet 0.25 mg  0.25 mg Oral Daily Debby Crosley, MD   0.25 mg at 12/06/14 0900    Allergies as of 12/05/2014  . (No Known Allergies)    No family history on file.  History   Social History  . Marital Status: Divorced    Spouse Name: N/A  . Number of Children: N/A  . Years of Education: N/A   Occupational History  . Not on file.   Social History Main Topics  . Smoking status: Never Smoker   . Smokeless tobacco: Not on file  . Alcohol Use: No  . Drug Use: No  . Sexual Activity: Not on file   Other Topics Concern  . Not on file   Social History Narrative    Review of Systems:  Unobtainable, because the patient has severe dementia  Physical Exam: Vital signs in last 24 hours: Temp:  [98.4 F (36.9 C)-98.6 F (37 C)] 98.6 F (37 C) (07/19 0700) Pulse Rate:  [76-90] 80 (07/19 0700) Resp:  [12-18] 16 (07/19 0700) BP: (140-150)/(45-83) 140/76 mmHg (07/19 0700) SpO2:  [98 %-100 %] 100 % (07/19 0700) Weight:  [51.7 kg (113 lb 15.7 oz)] 51.7 kg (113 lb 15.7 oz) (07/19 0600) Last BM Date: 12/06/14 General:   Alert,  Well-developed, well-nourished, pleasant, noncommunicative, NAD Head:  Normocephalic and atraumatic. Eyes:  Sclera clear, no icterus.   Conjunctiva pink. Mouth:   Would not open mouth on request Neck:   No masses or thyromegaly. Lungs: No respiratory distress. Would not take deep breaths on  request. Therefore, could not really assess breath sounds. Heart:   Regular rate and rhythm; no murmurs, clicks, rubs,  or gallops. Abdomen:  Soft, nontender, nontympanitic, and nondistended. No masses, hepatosplenomegaly or ventral hernias noted. Normal bowel sounds, without bruits, guarding, or rebound.   Rectal:  I checked a rectal exam at this time, and the rectal ampulla is entirely empty. No masses are felt. There is a little perianal skin tag but no obvious hemorrhoidal disease. There is no stool whatsoever in the rectal ampulla, and the examining finger has no blood on it. Wiping the perianal area shows a very light trace of pink fluid on the paper.   Msk:   Symmetrical ? Leg contractures. Pulses:  Normal radial pulse is noted. Extremities:   Without clubbing, cyanosis, or edema. Neurologic: No obvious focal deficits, but is noncommunicative consistent with advanced dementia Skin:  Intact without significant lesions or rashes. Cervical Nodes:  No significant cervical adenopathy. Psych: Quiet, minimally agitated when examined or manipulated  Intake/Output from previous day: 07/18 0701 - 07/19 0700 In: 241.3 [I.V.:191.3; IV Piggyback:50] Out: -  Intake/Output  this shift:    Lab Results:  Recent Labs  12/05/14 2136 12/06/14 0123 12/06/14 0925  WBC 14.0*  --   --   HGB 11.6* 10.9* 10.2*  HCT 36.5 34.1* 32.3*  PLT 218  --   --    BMET  Recent Labs  12/05/14 2136 12/06/14 0225  NA 141 141  K 4.4 4.5  CL 109 110  CO2 25 26  GLUCOSE 133* 102*  BUN 32* 30*  CREATININE 1.71* 1.44*  CALCIUM 9.1 8.7*   LFT  Recent Labs  12/05/14 2136  PROT 6.3*  ALBUMIN 3.6  AST 23  ALT 16  ALKPHOS 63  BILITOT 0.4   PT/INR  Recent Labs  12/05/14 2136  LABPROT 14.1  INR 1.07    Studies/Results: Dg Chest Port 1 View  12/06/2014   CLINICAL DATA:  Blood in stool. History of COPD, hypertension, Alzheimer's/ dementia.  EXAM: PORTABLE CHEST - 1 VIEW  COMPARISON:  Chest radiograph July 02, 2014  FINDINGS: Cardiac silhouette appears mildly enlarged, unchanged. Tortuous calcified aorta. Mild chronic interstitial changes and increased lung volumes consistent with COPD without pleural effusion or focal consolidation. Apical pleural thickening bold LEFT lung base atelectasis/scarring. No pneumothorax. Patient is osteopenic. Old RIGHT humeral head and RIGHT mid clavicle fracture. Old bilateral rib fractures.  IMPRESSION: Mild cardiomegaly and COPD.   Electronically Signed   By: Awilda Metroourtnay  Bloomer M.D.   On: 12/06/2014 01:00    Impression: Lower GI bleed, currently quiescent, most likely of diverticular origin based on known diverticular disease and benign abdominal exam   Plan: Observation and supportive care. I have discussed the patient's case with her son on the telephone, and we both agree that she is too old to put through nonessential diagnostic testing. If she has brisk hemorrhage in the future, we could consider a bleeding scan.   LOS: 0 days   Tanay Massiah V  12/06/2014, 9:55 AM   Pager [phone] If no answer or after 5 PM call [phone]

## 2014-12-07 ENCOUNTER — Encounter (HOSPITAL_COMMUNITY): Payer: Self-pay

## 2014-12-07 ENCOUNTER — Emergency Department (HOSPITAL_COMMUNITY)
Admission: EM | Admit: 2014-12-07 | Discharge: 2014-12-08 | Disposition: A | Discharge status: Skilled Nursing Facility | Payer: Medicare Other | Attending: Emergency Medicine | Admitting: Emergency Medicine

## 2014-12-07 DIAGNOSIS — S51012A Laceration without foreign body of left elbow, initial encounter: Secondary | ICD-10-CM | POA: Insufficient documentation

## 2014-12-07 DIAGNOSIS — K219 Gastro-esophageal reflux disease without esophagitis: Secondary | ICD-10-CM | POA: Insufficient documentation

## 2014-12-07 DIAGNOSIS — S59911A Unspecified injury of right forearm, initial encounter: Secondary | ICD-10-CM | POA: Diagnosis not present

## 2014-12-07 DIAGNOSIS — D62 Acute posthemorrhagic anemia: Secondary | ICD-10-CM | POA: Insufficient documentation

## 2014-12-07 DIAGNOSIS — W19XXXA Unspecified fall, initial encounter: Secondary | ICD-10-CM

## 2014-12-07 DIAGNOSIS — I10 Essential (primary) hypertension: Secondary | ICD-10-CM | POA: Insufficient documentation

## 2014-12-07 DIAGNOSIS — M79631 Pain in right forearm: Secondary | ICD-10-CM

## 2014-12-07 DIAGNOSIS — F419 Anxiety disorder, unspecified: Secondary | ICD-10-CM | POA: Insufficient documentation

## 2014-12-07 DIAGNOSIS — Y92128 Other place in nursing home as the place of occurrence of the external cause: Secondary | ICD-10-CM | POA: Insufficient documentation

## 2014-12-07 DIAGNOSIS — W06XXXA Fall from bed, initial encounter: Secondary | ICD-10-CM | POA: Insufficient documentation

## 2014-12-07 DIAGNOSIS — S51001A Unspecified open wound of right elbow, initial encounter: Secondary | ICD-10-CM | POA: Insufficient documentation

## 2014-12-07 DIAGNOSIS — G309 Alzheimer's disease, unspecified: Secondary | ICD-10-CM | POA: Insufficient documentation

## 2014-12-07 DIAGNOSIS — Y9389 Activity, other specified: Secondary | ICD-10-CM | POA: Insufficient documentation

## 2014-12-07 DIAGNOSIS — Z87448 Personal history of other diseases of urinary system: Secondary | ICD-10-CM | POA: Insufficient documentation

## 2014-12-07 DIAGNOSIS — Y998 Other external cause status: Secondary | ICD-10-CM | POA: Insufficient documentation

## 2014-12-07 DIAGNOSIS — Z79899 Other long term (current) drug therapy: Secondary | ICD-10-CM | POA: Insufficient documentation

## 2014-12-07 DIAGNOSIS — F0280 Dementia in other diseases classified elsewhere without behavioral disturbance: Secondary | ICD-10-CM | POA: Insufficient documentation

## 2014-12-07 LAB — BASIC METABOLIC PANEL
ANION GAP: 9 (ref 5–15)
BUN: 15 mg/dL (ref 6–20)
CHLORIDE: 110 mmol/L (ref 101–111)
CO2: 23 mmol/L (ref 22–32)
CREATININE: 1.06 mg/dL — AB (ref 0.44–1.00)
Calcium: 8.4 mg/dL — ABNORMAL LOW (ref 8.9–10.3)
GFR, EST AFRICAN AMERICAN: 50 mL/min — AB (ref >60)
GFR, EST NON AFRICAN AMERICAN: 43 mL/min — AB (ref >60)
Glucose, Bld: 106 mg/dL — ABNORMAL HIGH (ref 65–99)
POTASSIUM: 3.9 mmol/L (ref 3.5–5.1)
Sodium: 142 mmol/L (ref 135–145)

## 2014-12-07 LAB — CBC
HCT: 36.6 % (ref 36.0–46.0)
Hemoglobin: 11.7 g/dL — ABNORMAL LOW (ref 12.0–15.0)
MCH: 30.1 pg (ref 26.0–34.0)
MCHC: 32 g/dL (ref 30.0–36.0)
MCV: 94.1 fL (ref 78.0–100.0)
Platelets: 218 10*3/uL (ref 150–400)
RBC: 3.89 MIL/uL (ref 3.87–5.11)
RDW: 14 % (ref 11.5–15.5)
WBC: 7.9 10*3/uL (ref 4.0–10.5)

## 2014-12-07 LAB — HEMOGLOBIN AND HEMATOCRIT, BLOOD
HCT: 32.8 % — ABNORMAL LOW (ref 36.0–46.0)
Hemoglobin: 10.7 g/dL — ABNORMAL LOW (ref 12.0–15.0)

## 2014-12-07 MED ORDER — RISPERIDONE 0.25 MG PO TABS: 0.2500 mg | ORAL_TABLET | Freq: Every day | ORAL | Status: AC

## 2014-12-07 MED ORDER — HYDRALAZINE HCL 10 MG PO TABS: 10.0000 mg | ORAL_TABLET | Freq: Two times a day (BID) | ORAL | Status: AC

## 2014-12-07 MED ORDER — RISPERIDONE 0.5 MG PO TABS: 0.5000 mg | ORAL_TABLET | Freq: Every day | ORAL | Status: AC

## 2014-12-07 MED ORDER — PANTOPRAZOLE SODIUM 40 MG PO TBEC: 40.0000 mg | DELAYED_RELEASE_TABLET | Freq: Two times a day (BID) | ORAL | Status: DC

## 2014-12-07 MED ORDER — ALPRAZOLAM 0.25 MG PO TABS: 0.2500 mg | ORAL_TABLET | Freq: Four times a day (QID) | ORAL | Status: DC | PRN

## 2014-12-07 NOTE — Progress Notes (Signed)
No active bleeding, per discussion with nurse.  Hemoglobin stable.  Patient is lying in bed, no distress, abdomen without overt tenderness.  Impression: Quiescent lower GI bleeding, probably diverticular in origin  Recommendation:  1. Okay to feed patient (diet ordered) 2. In view of the patient's age and the fact she's had colonoscopy within the past 10 years that showed diverticulosis and no evidence of cancer, I do not feel that sigmoidoscopic or colonoscopic evaluation is necessary or appropriate, as long as the bleeding remains quiescent. 3. Would consider discharge tomorrow if the patient remains without evidence of further bleeding.  I will sign off at this time, but please call me if I can be of further assistance in this patient's care.  Florencia Reasonsobert V. Sondi Desch, M.D. Pager [phone] If no answer or after 5 PM call [phone]

## 2014-12-07 NOTE — ED Notes (Signed)
[facility] where staff reports she rolled out of bed.  Found by EMS lying in the floor on right shoulder.  C/o right shoulder pain, relieved when she was removed from floor.  Upon arrival to ED, patient is sleeping and difficult to awaken.  She takes a sleep aid.

## 2014-12-07 NOTE — Progress Notes (Signed)
Discharge instructions accompanied pt, left the unit in stable condition, transported via ambulance to ALF.

## 2014-12-07 NOTE — ED Notes (Signed)
Bed: [id number] Expected date:  Expected time:  Means of arrival:  Comments: EMS 81 yo female from SNF-rolled out of bed on to floor/right shoulder pain

## 2014-12-07 NOTE — Clinical Documentation Improvement (Signed)
MD's,NP's and PA's  Noted patient's bmi 18.97, please document clinical condition if appropriate for this admission.  Thank you  Possible Clinical conditions  Underweight   Cachexia  Other condition  Cannot clinically determine   Risk Factors: dementia, alzheimer's, copd    Diagnostics: nutrition evaluation  Treatment: monitor  Thank You, Lavonda JumboLawanda J Samanda Buske ,RN Clinical Documentation Specialist:  [phone]  [facility]- Health Information Management

## 2014-12-07 NOTE — Discharge Instructions (Addendum)
Bloody Stools Bloody stools often mean that there is a problem in the digestive tract. Your caregiver may use the term "melena" to describe black, tarry, and bad smelling stools or "hematochezia" to describe red or maroon-colored stools. Blood seen in the stool can be caused by bleeding anywhere along the intestinal tract.  A black stool usually means that blood is coming from the upper part of the gastrointestinal tract (esophagus, stomach, or small bowel). Passing maroon-colored stools or bright red blood usually means that blood is coming from lower down in the large bowel or the rectum. However, sometimes massive bleeding in the stomach or small intestine can cause bright red bloody stools.  Consuming black licorice, lead, iron pills, medicines containing bismuth subsalicylate, or blueberries can also cause black stools. Your caregiver can test black stools to see if blood is present. It is important that the cause of the bleeding be found. Treatment can then be started, and the problem can be corrected. Rectal bleeding may not be serious, but you should not assume everything is okay until you know the cause.It is very important to follow up with your caregiver or a specialist in gastrointestinal problems. CAUSES  Blood in the stools can come from various underlying causes.Often, the cause is not found during your first visit. Testing is often needed to discover the cause of bleeding in the gastrointestinal tract. Causes range from simple to serious or even life-threatening.Possible causes include:  Hemorrhoids.These are veins that are full of blood (engorged) in the rectum. They cause pain, inflammation, and may bleed.  Anal fissures.These are areas of painful tearing which may bleed. They are often caused by passing hard stool.  Diverticulosis.These are pouches that form on the colon over time, with age, and may bleed significantly.  Diverticulitis.This is inflammation in areas with  diverticulosis. It can cause pain, fever, and bloody stools, although bleeding is rare.  Proctitis and colitis. These are inflamed areas of the rectum or colon. They may cause pain, fever, and bloody stools.  Polyps and cancer. Colon cancer is a leading cause of preventable cancer death.It often starts out as precancerous polyps that can be removed during a colonoscopy, preventing progression into cancer. Sometimes, polyps and cancer may cause rectal bleeding.  Gastritis and ulcers.Bleeding from the upper gastrointestinal tract (near the stomach) may travel through the intestines and produce black, sometimes tarry, often bad smelling stools. In certain cases, if the bleeding is fast enough, the stools may not be black, but red and the condition may be life-threatening. SYMPTOMS  You may have stools that are bright red and bloody, that are normal color with blood on them, or that are dark black and tarry. In some cases, you may only have blood in the toilet bowl. Any of these cases need medical care. You may also have:  Pain at the anus or anywhere in the rectum.  Lightheadedness or feeling faint.  Extreme weakness.  Nausea or vomiting.  Fever. DIAGNOSIS Your caregiver may use the following methods to find the cause of your bleeding:  Taking a medical history. Age is important. Older people tend to develop polyps and cancer more often. If there is anal pain and a hard, large stool associated with bleeding, a tear of the anus may be the cause. If blood drips into the toilet after a bowel movement, bleeding hemorrhoids may be the problem. The color and frequency of the bleeding are additional considerations. In most cases, the medical history provides clues, but seldom the final  answer.  A visual and finger (digital) exam. Your caregiver will inspect the anal area, looking for tears and hemorrhoids. A finger exam can provide information when there is tenderness or a growth inside. In men, the  prostate is also examined.  Endoscopy. Several types of small, long scopes (endoscopes) are used to view the colon.  In the office, your caregiver may use a rigid, or more commonly, a flexible viewing sigmoidoscope. This exam is called flexible sigmoidoscopy. It is performed in 5 to 10 minutes.  A more thorough exam is accomplished with a colonoscope. It allows your caregiver to view the entire 5 to 6 foot long colon. Medicine to help you relax (sedative) is usually given for this exam. Frequently, a bleeding lesion may be present beyond the reach of the sigmoidoscope. So, a colonoscopy may be the best exam to start with. Both exams are usually done on an outpatient basis. This means the patient does not stay overnight in the hospital or surgery center.  An upper endoscopy may be needed to examine your stomach. Sedation is used and a flexible endoscope is put in your mouth, down to your stomach.  A barium enema X-ray. This is an X-ray exam. It uses liquid barium inserted by enema into the rectum. This test alone may not identify an actual bleeding point. X-rays highlight abnormal shadows, such as those made by lumps (tumors), diverticuli, or colitis. TREATMENT  Treatment depends on the cause of your bleeding.   For bleeding from the stomach or colon, the caregiver doing your endoscopy or colonoscopy may be able to stop the bleeding as part of the procedure.  Inflammation or infection of the colon can be treated with medicines.  Many rectal problems can be treated with creams, suppositories, or warm baths.  Surgery is sometimes needed.  Blood transfusions are sometimes needed if you have lost a lot of blood.  For any bleeding problem, let your caregiver know if you take aspirin or other blood thinners regularly. HOME CARE INSTRUCTIONS   Take any medicines exactly as prescribed.  Keep your stools soft by eating a diet high in fiber. Prunes (1 to 3 a day) work well for many people.  Drink  enough water and fluids to keep your urine clear or pale yellow.  Take sitz baths if advised. A sitz bath is when you sit in a bathtub with warm water for 10 to 15 minutes to soak, soothe, and cleanse the rectal area.  If enemas or suppositories are advised, be sure you know how to use them. Tell your caregiver if you have problems with this.  Monitor your bowel movements to look for signs of improvement or worsening. SEEK MEDICAL CARE IF:   You do not improve in the time expected.  Your condition worsens after initial improvement.  You develop any new symptoms. SEEK IMMEDIATE MEDICAL CARE IF:   You develop severe or prolonged rectal bleeding.  You vomit blood.  You feel weak or faint.  You have a fever. MAKE SURE YOU:  Understand these instructions.  Will watch your condition.  Will get help right away if you are not doing well or get worse. Document Released: 04/26/2002 Document Revised: 07/29/2011 Document Reviewed: 09/21/2010 [facility] Patient Information 2015 [place], [place]. This information is not intended to replace advice given to you by your health care provider. Make sure you discuss any questions you have with your health care provider.  Pantoprazole tablets What is this medicine? PANTOPRAZOLE (pan TOE pra zole) prevents  the production of acid in the stomach. It is used to treat gastroesophageal reflux disease (GERD), inflammation of the esophagus, and Zollinger-Ellison syndrome. This medicine may be used for other purposes; ask your health care provider or pharmacist if you have questions. COMMON BRAND NAME(S): Protonix What should I tell my health care provider before I take this medicine? They need to know if you have any of these conditions: -liver disease -low levels of magnesium in the blood -an unusual or allergic reaction to omeprazole, lansoprazole, pantoprazole, rabeprazole, other medicines, foods, dyes, or preservatives -pregnant or trying to get  pregnant -breast-feeding How should I use this medicine? Take this medicine by mouth. Swallow the tablets whole with a drink of water. Follow the directions on the prescription label. Do not crush, break, or chew. Take your medicine at regular intervals. Do not take your medicine more often than directed. Talk to your pediatrician regarding the use of this medicine in children. While this drug may be prescribed for children as young as 5 years for selected conditions, precautions do apply. Overdosage: If you think you have taken too much of this medicine contact a poison control center or emergency room at once. NOTE: This medicine is only for you. Do not share this medicine with others. What if I miss a dose? If you miss a dose, take it as soon as you can. If it is almost time for your next dose, take only that dose. Do not take double or extra doses. What may interact with this medicine? Do not take this medicine with any of the following medications: -atazanavir -nelfinavir This medicine may also interact with the following medications: -ampicillin -delavirdine -digoxin -diuretics -iron salts -medicines for fungal infections like ketoconazole, itraconazole and voriconazole -warfarin This list may not describe all possible interactions. Give your health care provider a list of all the medicines, herbs, non-prescription drugs, or dietary supplements you use. Also tell them if you smoke, drink alcohol, or use illegal drugs. Some items may interact with your medicine. What should I watch for while using this medicine? It can take several days before your stomach pain gets better. Check with your doctor or health care professional if your condition does not start to get better, or if it gets worse. You may need blood work done while you are taking this medicine. What side effects may I notice from receiving this medicine? Side effects that you should report to your doctor or health care  professional as soon as possible: -allergic reactions like skin rash, itching or hives, swelling of the face, lips, or tongue -bone, muscle or joint pain -breathing problems -chest pain or chest tightness -dark yellow or brown urine -dizziness -fast, irregular heartbeat -feeling faint or lightheaded -fever or sore throat -muscle spasm -palpitations -redness, blistering, peeling or loosening of the skin, including inside the mouth -seizures -tremors -unusual bleeding or bruising -unusually weak or tired -yellowing of the eyes or skin Side effects that usually do not require medical attention (Report these to your doctor or health care professional if they continue or are bothersome.): -constipation -diarrhea -dry mouth -headache -nausea This list may not describe all possible side effects. Call your doctor for medical advice about side effects. You may report side effects to FDA at 1-800-FDA-1088. Where should I keep my medicine? Keep out of the reach of children. Store at room temperature between 15 and 30 degrees C (59 and 86 degrees F). Protect from light and moisture. Throw away any unused medicine after the  expiration date. NOTE: This sheet is a summary. It may not cover all possible information. If you have questions about this medicine, talk to your doctor, pharmacist, or health care provider.  2015, [employer] Standard. (2012-03-04 16:40:16)

## 2014-12-07 NOTE — Discharge Summary (Addendum)
Physician Discharge Summary  [name] ZOX:[id number] DOB: 02/18/1917 DOA: 12/05/2014  PCP: Ron Parker, MD  Admit date: 12/05/2014 Discharge date: 12/07/2014  Recommendations for Outpatient Follow-up:  1. Continue Protonix twice daily on discharge.  Discharge Diagnoses:  Principal Problem:   Acute GI bleeding Active Problems:   Anxiety   Alzheimer's dementia   COPD (chronic obstructive pulmonary disease)   HTN (hypertension)   Acute kidney injury   GI bleed    Discharge Condition: stable   Diet recommendation: as tolerated   History of present illness:  81 -year-old female with past medical history of dementia from nursing home, COPD, hypertension who presented with blood found in her diaper. On admission, in ED digital exam showed copious blood but no further bleeding since then. Patient was seen by GI in consultation and plan was for observation.   Assessment/Plan:    Principal Problem: Lower GI bleed / acute blood loss anemia / acute gastrointestinal hemorrhage - Probably diverticular type of bleed - Hemoglobin remains stable at 10.2, 11.7 - Continue PPI therapy - GI has seen the patient in consultation and recommends observation  Active Problems:  Anxiety - Stable - Continue Celexa and risperidone   Alzheimer's dementia - Stable - No behavioral disturbance - Continue risperidone and Aricept   Acute renal failure - Likely from prerenal etiology, GI bleed versus lisinopril  - Lisinopril placed on hold due to ARF --Creatinine improved with IV fluids   Essential hypertension - Lisinopril on hold because of acute renal failure - Added hydralazine twice a day 10 mg for blood pressure control      DVT Prophylaxis  - SCD's bilaterally due to risk of bleeding    Code Status: DNR/DNI Family Communication: Family not at the bedside; spoke with pt's son Rodeny over the phone at [phone]    IV access:  Peripheral IV  Procedures  and diagnostic studies:   Dg Chest Port 1 View 12/06/2014 Mild cardiomegaly and COPD.   Medical Consultants:  Gastroenterology, Dr. Bernette Redbird   Other Consultants:  Physical therapy Nutrition  IAnti-Infectives:   None      Signed:  Manson Passey, MD  [facility] 12/07/2014, 10:50 AM  Pager #: [phone]  Time spent in minutes: less than 30 minutes   Discharge Exam: Filed Vitals:   12/07/14 0524  BP: 161/76  Pulse: 82  Temp: 98.2 F (36.8 C)  Resp: 17   Filed Vitals:   12/06/14 0700 12/06/14 1702 12/06/14 2209 12/07/14 0524  BP: 140/76 158/73 156/96 161/76  Pulse: 80 73 110 82  Temp: 98.6 F (37 C) 97.9 F (36.6 C) 98.6 F (37 C) 98.2 F (36.8 C)  TempSrc: Oral Axillary Axillary Oral  Resp: 16 20  17   Height: 5\' 5"  (1.651 m)     Weight:      SpO2: 100% 98% 95% 100%    General: Pt is not in acute distress Cardiovascular: Rate controoled, S1/S2 + Respiratory: Clear to auscultation bilaterally, no wheezing, no crackles, no rhonchi Abdominal: Soft, non tender, non distended, bowel sounds +, no guarding Extremities: no edema, no cyanosis, pulses palpable bilaterally DP and PT Neuro: Grossly nonfocal  Discharge Instructions  Discharge Instructions    Call MD for:  difficulty breathing, headache or visual disturbances    Complete by:  As directed      Call MD for:  persistant nausea and vomiting    Complete by:  As directed      Call MD for:  severe uncontrolled pain  Complete by:  As directed      Diet - low sodium heart healthy    Complete by:  As directed      Discharge instructions    Complete by:  As directed   1. Continue Protonix twice daily on discharge.     Increase activity slowly    Complete by:  As directed             Medication List    STOP taking these medications        ciprofloxacin 250 MG tablet  Commonly known as:  CIPRO     levofloxacin 500 MG tablet  Commonly known as:  LEVAQUIN      lisinopril 10 MG tablet  Commonly known as:  PRINIVIL,ZESTRIL     mineral oil enema     polyethylene glycol packet  Commonly known as:  MIRALAX     potassium chloride 10 MEQ tablet  Commonly known as:  K-DUR,KLOR-CON      TAKE these medications        acetaminophen 500 MG tablet  Commonly known as:  TYLENOL  Take 500 mg by mouth every 6 (six) hours as needed for mild pain.     ALPRAZolam 0.25 MG tablet  Commonly known as:  XANAX  Take 1 tablet (0.25 mg total) by mouth every 6 (six) hours as needed for anxiety (agitation). Not to exceed 6 tablets in 24 hours     alum & mag hydroxide-simeth 200-200-20 MG/5ML suspension  Commonly known as:  MAALOX/MYLANTA  Take 30 mLs by mouth every 6 (six) hours as needed for indigestion or heartburn.     azelastine 0.05 % ophthalmic solution  Commonly known as:  OPTIVAR  Place 1 drop into both eyes 2 (two) times daily.     bumetanide 1 MG tablet  Commonly known as:  BUMEX  Take 0.5 mg by mouth 2 (two) times a week. Monday and Thursday.     citalopram 10 MG tablet  Commonly known as:  CELEXA  Take 10 mg by mouth daily.     donepezil 10 MG tablet  Commonly known as:  ARICEPT  Take 10 mg by mouth at bedtime.     ENSURE  Take 237 mLs by mouth 3 (three) times daily with meals.     guaifenesin 100 MG/5ML syrup  Commonly known as:  ROBITUSSIN  Take 200 mg by mouth every 6 (six) hours as needed for cough.     hydrALAZINE 10 MG tablet  Commonly known as:  APRESOLINE  Take 1 tablet (10 mg total) by mouth 2 (two) times daily.     hydrocerin Crea  Apply 1 application topically 2 (two) times daily.     loperamide 2 MG capsule  Commonly known as:  IMODIUM  Take 2 mg by mouth as needed for diarrhea or loose stools (not to exceed 8 doses in 24 hours).     mirtazapine 30 MG tablet  Commonly known as:  REMERON  Take 30 mg by mouth at bedtime.     pantoprazole 40 MG tablet  Commonly known as:  PROTONIX  Take 1 tablet (40 mg total) by  mouth 2 (two) times daily.     potassium chloride 20 MEQ/15ML (10%) Soln  Take 10 mEq by mouth daily.     risperiDONE 0.25 MG tablet  Commonly known as:  RISPERDAL  Take 1 tablet (0.25 mg total) by mouth daily.     risperiDONE 0.5 MG tablet  Commonly known as:  RISPERDAL  Take 1 tablet (0.5 mg total) by mouth at bedtime.     TRIPLE ANTIBIOTIC EX  Apply 1 application topically. Apply after cleaning with normal saline, cover with bandaid or gauze and secure with tape. Change as needed until healed for skin tears, abrasions, or minor irritations.     Vitamin D (Ergocalciferol) 50000 UNITS Caps capsule  Commonly known as:  DRISDOL  Take 50,000 Units by mouth every Thursday.            Follow-up Information    [facility] Schedule an appointment as soon as possible for a visit in 1 week.   Specialty:  Internal Medicine   Why:  Follow up appt after recent hospitalization       The results of significant diagnostics from this hospitalization (including imaging, microbiology, ancillary and laboratory) are listed below for reference.    Significant Diagnostic Studies: Ct Head Wo Contrast  11/19/2014   CLINICAL DATA:  Status post unwitnessed fall. Concern for head or cervical spine injury. Initial encounter.  EXAM: CT HEAD WITHOUT CONTRAST  CT CERVICAL SPINE WITHOUT CONTRAST  TECHNIQUE: Multidetector CT imaging of the head and cervical spine was performed following the standard protocol without intravenous contrast. Multiplanar CT image reconstructions of the cervical spine were also generated.  COMPARISON:  CT of the head performed 10/29/2014, and CT of the head and cervical spine performed 04/25/2013  FINDINGS: CT HEAD FINDINGS  There is no evidence of acute infarction, mass lesion, or intra- or extra-axial hemorrhage on CT.  Prominence of the ventricles and sulci reflects moderate cortical volume loss. Cerebellar atrophy is noted. Diffuse periventricular and  subcortical white matter change likely reflects small vessel ischemic microangiopathy. Chronic ischemic change is noted at the basal ganglia bilaterally.  The brainstem and fourth ventricle are within normal limits. The cerebral hemispheres demonstrate grossly normal gray-white differentiation. No mass effect or midline shift is seen.  There is no evidence of fracture; visualized osseous structures are unremarkable in appearance. The orbits are within normal limits. There is complete opacification of the right mastoid air cells. The paranasal sinuses and left mastoid air cells are well-aerated. No significant soft tissue abnormalities are seen.  CT CERVICAL SPINE FINDINGS  There is no evidence of acute fracture or subluxation. Vertebral bodies demonstrate normal height. There is stable grade 1 retrolisthesis of C3 on C4, and stable grade 1 anterolisthesis of C5 on C6 and of C6 on C7. Disc space narrowing is noted at C3-C4 and C6-C7. Mild underlying facet disease is noted. Prevertebral soft tissues are within normal limits.  The thyroid gland is unremarkable in appearance. Prominent scarring is noted at the lung apices, with associated calcification. Mild calcification is seen at the carotid bifurcations bilaterally.  IMPRESSION: 1. No evidence of traumatic intracranial injury or fracture. 2. No evidence of acute fracture or subluxation along the cervical spine. 3. Moderate cortical volume loss and diffuse small vessel ischemic microangiopathy. Chronic ischemic change at the basal ganglia bilaterally. 4. Complete opacification of the right mastoid air cells. 5. Mild degenerative change along the cervical spine. 6. Prominent scarring at the lung apices, with associated calcification. 7. Mild calcification at the carotid bifurcations bilaterally.   Electronically Signed   By: Roanna RaiderJeffery  Chang M.D.   On: 11/19/2014 01:19   Ct Cervical Spine Wo Contrast  11/19/2014   CLINICAL DATA:  Status post unwitnessed fall. Concern  for head or cervical spine injury. Initial encounter.  EXAM: CT HEAD WITHOUT CONTRAST  CT CERVICAL SPINE WITHOUT CONTRAST  TECHNIQUE: Multidetector CT imaging of the head and cervical spine was performed following the standard protocol without intravenous contrast. Multiplanar CT image reconstructions of the cervical spine were also generated.  COMPARISON:  CT of the head performed 10/29/2014, and CT of the head and cervical spine performed 04/25/2013  FINDINGS: CT HEAD FINDINGS  There is no evidence of acute infarction, mass lesion, or intra- or extra-axial hemorrhage on CT.  Prominence of the ventricles and sulci reflects moderate cortical volume loss. Cerebellar atrophy is noted. Diffuse periventricular and subcortical white matter change likely reflects small vessel ischemic microangiopathy. Chronic ischemic change is noted at the basal ganglia bilaterally.  The brainstem and fourth ventricle are within normal limits. The cerebral hemispheres demonstrate grossly normal gray-white differentiation. No mass effect or midline shift is seen.  There is no evidence of fracture; visualized osseous structures are unremarkable in appearance. The orbits are within normal limits. There is complete opacification of the right mastoid air cells. The paranasal sinuses and left mastoid air cells are well-aerated. No significant soft tissue abnormalities are seen.  CT CERVICAL SPINE FINDINGS  There is no evidence of acute fracture or subluxation. Vertebral bodies demonstrate normal height. There is stable grade 1 retrolisthesis of C3 on C4, and stable grade 1 anterolisthesis of C5 on C6 and of C6 on C7. Disc space narrowing is noted at C3-C4 and C6-C7. Mild underlying facet disease is noted. Prevertebral soft tissues are within normal limits.  The thyroid gland is unremarkable in appearance. Prominent scarring is noted at the lung apices, with associated calcification. Mild calcification is seen at the carotid bifurcations  bilaterally.  IMPRESSION: 1. No evidence of traumatic intracranial injury or fracture. 2. No evidence of acute fracture or subluxation along the cervical spine. 3. Moderate cortical volume loss and diffuse small vessel ischemic microangiopathy. Chronic ischemic change at the basal ganglia bilaterally. 4. Complete opacification of the right mastoid air cells. 5. Mild degenerative change along the cervical spine. 6. Prominent scarring at the lung apices, with associated calcification. 7. Mild calcification at the carotid bifurcations bilaterally.   Electronically Signed   By: Roanna Raider M.D.   On: 11/19/2014 01:19   Dg Chest Port 1 View  12/06/2014   CLINICAL DATA:  Blood in stool. History of COPD, hypertension, Alzheimer's/ dementia.  EXAM: PORTABLE CHEST - 1 VIEW  COMPARISON:  Chest radiograph July 02, 2014  FINDINGS: Cardiac silhouette appears mildly enlarged, unchanged. Tortuous calcified aorta. Mild chronic interstitial changes and increased lung volumes consistent with COPD without pleural effusion or focal consolidation. Apical pleural thickening bold LEFT lung base atelectasis/scarring. No pneumothorax. Patient is osteopenic. Old RIGHT humeral head and RIGHT mid clavicle fracture. Old bilateral rib fractures.  IMPRESSION: Mild cardiomegaly and COPD.   Electronically Signed   By: Awilda Metro M.D.   On: 12/06/2014 01:00    Microbiology: Recent Results (from the past 240 hour(s))  MRSA PCR Screening     Status: None   Collection Time: 12/06/14  1:38 AM  Result Value Ref Range Status   MRSA by PCR NEGATIVE NEGATIVE Final    Comment:        The GeneXpert MRSA Assay (FDA approved for NASAL specimens only), is one component of a comprehensive MRSA colonization surveillance program. It is not intended to diagnose MRSA infection nor to guide or monitor treatment for MRSA infections.      Labs: Basic Metabolic Panel:  Recent Labs Lab 12/05/14 2136 12/06/14 0225 12/07/14  0510   NA 141 141 142  K 4.4 4.5 3.9  CL 109 110 110  CO2 GLUCOSE 133* 102* 106*  BUN 32* 30* 15  CREATININE 1.71* 1.44* 1.06*  CALCIUM 9.1 8.7* 8.4*   Liver Function Tests:  Recent Labs Lab 12/05/14 2136  AST 23  ALT 16  ALKPHOS 63  BILITOT 0.4  PROT 6.3*  ALBUMIN 3.6   No results for input(s): LIPASE, AMYLASE in the last 168 hours. No results for input(s): AMMONIA in the last 168 hours. CBC:  Recent Labs Lab 12/05/14 2136 12/06/14 0123 12/06/14 0925 12/06/14 1737 12/07/14 0125 12/07/14 0510  WBC 14.0*  --   --   --   --  7.9  NEUTROABS 12.3*  --   --   --   --   --   HGB 11.6* 10.9* 10.2* 10.3* 10.7* 11.7*  HCT 36.5 34.1* 32.3* 32.6* 32.8* 36.6  MCV 93.6  --   --   --   --  94.1  PLT 218  --   --   --   --  218   Cardiac Enzymes: No results for input(s): CKTOTAL, CKMB, CKMBINDEX, TROPONINI in the last 168 hours. BNP: BNP (last 3 results) No results for input(s): BNP in the last 8760 hours.  ProBNP (last 3 results) No results for input(s): PROBNP in the last 8760 hours.  CBG: No results for input(s): GLUCAP in the last 168 hours.

## 2014-12-07 NOTE — Progress Notes (Signed)
Clinical Social Work  CSW faxed DC summary and FL2 to ALF who is agreeable to accept patient back today. ALF reports they do not have Ensure but uses [facility]. CSW spoke with MD who is agreeable to this change. CSW prepared DC packet with FL2, DNR, DC summary and hard scripts included. CSW spoke with son via phone who is happy that patient is returning to ALF so she can be in a familiar environment. Son requests PTAR for transportation and aware of no guarantee of payment.  PTAR request #: [id number].  CSW is signing off but available if needed.  AtlantisHolly Kamirah Shugrue, [place] [phone]

## 2014-12-08 ENCOUNTER — Emergency Department (HOSPITAL_COMMUNITY): Payer: Medicare Other

## 2014-12-08 NOTE — Discharge Instructions (Signed)
Skin Tear Care  A skin tear is a wound in which the top layer of skin has peeled off. This is a common problem with aging because the skin becomes thinner and more fragile as a person gets older. In addition, some medicines, such as oral corticosteroids, can lead to skin thinning if taken for long periods of time.   A skin tear is often repaired with tape or skin adhesive strips. This keeps the skin that has been peeled off in contact with the healthier skin beneath. Depending on the location of the wound, a bandage (dressing) may be applied over the tape or skin adhesive strips. Sometimes, during the healing process, the skin turns black and dies. Even when this happens, the torn skin acts as a good dressing until the skin underneath gets healthier and repairs itself.  HOME CARE INSTRUCTIONS   · Change dressings once per day or as directed by your caregiver.  ¨ Gently clean the skin tear and the area around the tear using saline solution or mild soap and water.  ¨ Do not rub the injured skin dry. Let the area air dry.  ¨ Apply petroleum jelly or an antibiotic cream or ointment to keep the tear moist. This will help the wound heal. Do not allow a scab to form.  ¨ If the dressing sticks before the next dressing change, moisten it with warm soapy water and gently remove it.  · Protect the injured skin until it has healed.  · Only take over-the-counter or prescription medicines as directed by your caregiver.  · Take showers or baths using warm soapy water. Apply a new dressing after the shower or bath.  · Keep all follow-up appointments as directed by your caregiver.    SEEK IMMEDIATE MEDICAL CARE IF:   · You have redness, swelling, or increasing pain in the skin tear.  · You have pus coming from the skin tear.  · You have chills.  · You have a red streak that goes away from the skin tear.  · You have a bad smell coming from the tear or dressing.  · You have a fever or persistent symptoms for more than 2-3 days.  · You  have a fever and your symptoms suddenly get worse.  MAKE SURE YOU:  · Understand these instructions.  · Will watch this condition.  · Will get help right away if your child is not doing well or gets worse.  Document Released: 01/29/2001 Document Revised: 01/29/2012 Document Reviewed: 11/18/2011  ExitCare® Patient Information ©2015 [employer]. This information is not intended to replace advice given to you by your health care provider. Make sure you discuss any questions you have with your health care provider.

## 2014-12-08 NOTE — ED Notes (Signed)
Bed: [facility] Expected date:  Expected time:  Means of arrival:  Comments: 

## 2014-12-08 NOTE — ED Notes (Signed)
Patient lying on stretcher, eyes closed.  Respirations even and unlabored.  Awaiting PTAR.

## 2014-12-08 NOTE — ED Provider Notes (Signed)
CSN: [id number]     Arrival date & time 12/07/14  2308 History   This chart was scribed for Sherri Mo, MD by Evon Slack, ED Scribe. This patient was seen in room WA16/WA16 and the patient's care was started at 12:44 AM.     Chief Complaint  Patient presents with  . Fall    The history is provided by the EMS personnel. No language interpreter was used.   HPI Comments: Level 5 caveat: Dementia Lacye Mccarn is a 81 y.o. female with PMHx listed below brought in by ambulance, who presents to the Emergency Department complaining of fall onset PTA. Per ems pt rolled out of bed and landed on her right side. Pt is tender on right arm with associated bruising.  Past Medical History  Diagnosis Date  . GERD (gastroesophageal reflux disease)   . Anxiety   . Dementia   . Alzheimer disease   . COPD (chronic obstructive pulmonary disease)   . Hypertension   . Renal disorder    History reviewed. No pertinent past surgical history. No family history on file. History  Substance Use Topics  . Smoking status: Never Smoker   . Smokeless tobacco: Not on file  . Alcohol Use: No   OB History    No data available      Review of Systems  Unable to perform ROS: Dementia      Allergies  Review of patient's allergies indicates no known allergies.  Home Medications   Prior to Admission medications   Medication Sig Start Date End Date Taking? Authorizing Provider  ciprofloxacin (CIPRO) 250 MG tablet Take 250 mg by mouth 2 (two) times daily. For 7 days   Yes Historical Provider, MD  citalopram (CELEXA) 10 MG tablet Take 10 mg by mouth daily.   Yes Historical Provider, MD  ENSURE (ENSURE) Take 237 mLs by mouth 3 (three) times daily with meals.   Yes Historical Provider, MD  hydrocerin (EUCERIN) CREA Apply 1 application topically 2 (two) times daily.   Yes Historical Provider, MD  mirtazapine (REMERON) 30 MG tablet Take 30 mg by mouth at bedtime.   Yes Historical Provider, MD   risperiDONE (RISPERDAL) 0.25 MG tablet Take 1 tablet (0.25 mg total) by mouth daily. 12/07/14  Yes Alison Murray, MD  risperiDONE (RISPERDAL) 0.5 MG tablet Take 1 tablet (0.5 mg total) by mouth at bedtime. 12/07/14  Yes Alison Murray, MD  acetaminophen (TYLENOL) 500 MG tablet Take 500 mg by mouth every 6 (six) hours as needed for mild pain.    Historical Provider, MD  ALPRAZolam Prudy Feeler) 0.25 MG tablet Take 1 tablet (0.25 mg total) by mouth every 6 (six) hours as needed for anxiety (agitation). Not to exceed 6 tablets in 24 hours 12/07/14   Alison Murray, MD  alum & mag hydroxide-simeth (MAALOX/MYLANTA) 200-200-20 MG/5ML suspension Take 30 mLs by mouth every 6 (six) hours as needed for indigestion or heartburn.    Historical Provider, MD  azelastine (OPTIVAR) 0.05 % ophthalmic solution Place 1 drop into both eyes 2 (two) times daily.    Historical Provider, MD  bumetanide (BUMEX) 1 MG tablet Take 0.5 mg by mouth 2 (two) times a week. Monday and Thursday.    Historical Provider, MD  donepezil (ARICEPT) 10 MG tablet Take 10 mg by mouth at bedtime.    Historical Provider, MD  guaifenesin (ROBITUSSIN) 100 MG/5ML syrup Take 200 mg by mouth every 6 (six) hours as needed for cough.  Historical Provider, MD  hydrALAZINE (APRESOLINE) 10 MG tablet Take 1 tablet (10 mg total) by mouth 2 (two) times daily. 12/07/14   Alison Murray, MD  lisinopril (PRINIVIL,ZESTRIL) 10 MG tablet Take 10 mg by mouth daily.    Historical Provider, MD  loperamide (IMODIUM) 2 MG capsule Take 2 mg by mouth as needed for diarrhea or loose stools (not to exceed 8 doses in 24 hours).    Historical Provider, MD  Neomycin-Bacitracin-Polymyxin (TRIPLE ANTIBIOTIC EX) Apply 1 application topically. Apply after cleaning with normal saline, cover with bandaid or gauze and secure with tape. Change as needed until healed for skin tears, abrasions, or minor irritations.    Historical Provider, MD  pantoprazole (PROTONIX) 40 MG tablet Take 1 tablet  (40 mg total) by mouth 2 (two) times daily. 12/07/14   Alison Murray, MD  potassium chloride 20 MEQ/15ML (10%) SOLN Take 10 mEq by mouth 2 (two) times daily.     Historical Provider, MD  Vitamin D, Ergocalciferol, (DRISDOL) 50000 UNITS CAPS capsule Take 50,000 Units by mouth every Thursday.    Historical Provider, MD   BP 158/58 mmHg  Pulse 84  Temp(Src) 98.5 F (36.9 C) (Oral)  Resp 18  SpO2 99%   Physical Exam  Constitutional: She appears well-developed and well-nourished.  HENT:  Head: Normocephalic and atraumatic.  Right Ear: External ear normal.  Left Ear: External ear normal.  Eyes: Conjunctivae and EOM are normal. Pupils are equal, round, and reactive to light.  Neck: Normal range of motion. Neck supple.  Cardiovascular: Normal rate, regular rhythm, normal heart sounds and intact distal pulses.   Pulmonary/Chest: Effort normal and breath sounds normal.  Abdominal: Soft. Bowel sounds are normal. There is no tenderness.  Musculoskeletal: Normal range of motion.       Right elbow: She exhibits swelling and laceration (skin tear). She exhibits normal range of motion.       Left elbow: She exhibits laceration (old skin tear).       Cervical back: Normal.       Thoracic back: Normal.       Lumbar back: Normal.  Neurological: She is alert.  Skin: Skin is warm and dry.  Vitals reviewed.   ED Course  Procedures (including critical care time) DIAGNOSTIC STUDIES: Oxygen Saturation is 99% on RA, normal by my interpretation.    COORDINATION OF CARE:   Labs Review Labs Reviewed - No data to display  Imaging Review No results found.   EKG Interpretation None      MDM   Final diagnoses:  None      81 y.o. female with pertinent PMH of dementia presents with right elbow tenderness after a fall out of bed. History extremely limited by dementia. Thorough exam here demonstrated only tenderness of the right arm. No tenderness of the spine, hips, other areas of the body.  Workup as above unremarkable. No neurologic deficits in context of limited exam. Discharged home in stable condition..    I have reviewed all laboratory and imaging studies if ordered as above  1. Fall, initial encounter   2. Right forearm pain           Sherri Mo, MD 12/08/14 [phone]

## 2014-12-08 NOTE — ED Notes (Signed)
Communications called and reminded of patient's waiting for [facility].

## 2014-12-08 NOTE — ED Notes (Signed)
Communications notified of patient's need for transport back to [facility].  

## 2014-12-08 NOTE — ED Notes (Signed)
Patient moved to hallway for better visualization by staff.

## 2014-12-08 NOTE — ED Notes (Signed)
Dr. Littie Deeds assessing patient.

## 2014-12-08 NOTE — ED Notes (Signed)
Patient transported to X-ray 

## 2014-12-10 ENCOUNTER — Observation Stay (HOSPITAL_COMMUNITY)
Admission: EM | Admit: 2014-12-10 | Discharge: 2014-12-12 | Disposition: A | Discharge status: Skilled Nursing Facility | Payer: Medicare Other | Attending: Family Medicine | Admitting: Family Medicine

## 2014-12-10 ENCOUNTER — Encounter (HOSPITAL_COMMUNITY): Payer: Self-pay | Admitting: Emergency Medicine

## 2014-12-10 DIAGNOSIS — Z792 Long term (current) use of antibiotics: Secondary | ICD-10-CM | POA: Insufficient documentation

## 2014-12-10 DIAGNOSIS — G309 Alzheimer's disease, unspecified: Secondary | ICD-10-CM | POA: Insufficient documentation

## 2014-12-10 DIAGNOSIS — K219 Gastro-esophageal reflux disease without esophagitis: Secondary | ICD-10-CM | POA: Diagnosis not present

## 2014-12-10 DIAGNOSIS — K922 Gastrointestinal hemorrhage, unspecified: Secondary | ICD-10-CM

## 2014-12-10 DIAGNOSIS — R197 Diarrhea, unspecified: Secondary | ICD-10-CM | POA: Diagnosis not present

## 2014-12-10 DIAGNOSIS — J449 Chronic obstructive pulmonary disease, unspecified: Secondary | ICD-10-CM | POA: Insufficient documentation

## 2014-12-10 DIAGNOSIS — Z79899 Other long term (current) drug therapy: Secondary | ICD-10-CM | POA: Insufficient documentation

## 2014-12-10 DIAGNOSIS — F419 Anxiety disorder, unspecified: Secondary | ICD-10-CM | POA: Insufficient documentation

## 2014-12-10 DIAGNOSIS — K2970 Gastritis, unspecified, without bleeding: Secondary | ICD-10-CM | POA: Diagnosis not present

## 2014-12-10 DIAGNOSIS — I10 Essential (primary) hypertension: Secondary | ICD-10-CM | POA: Diagnosis not present

## 2014-12-10 DIAGNOSIS — F0280 Dementia in other diseases classified elsewhere without behavioral disturbance: Secondary | ICD-10-CM | POA: Diagnosis not present

## 2014-12-10 DIAGNOSIS — N179 Acute kidney failure, unspecified: Secondary | ICD-10-CM | POA: Diagnosis not present

## 2014-12-10 DIAGNOSIS — K625 Hemorrhage of anus and rectum: Secondary | ICD-10-CM | POA: Diagnosis not present

## 2014-12-10 LAB — CBC
HCT: 31.8 % — ABNORMAL LOW (ref 36.0–46.0)
HEMATOCRIT: 36 % (ref 36.0–46.0)
Hemoglobin: 11.2 g/dL — ABNORMAL LOW (ref 12.0–15.0)
Hemoglobin: 10.4 g/dL — ABNORMAL LOW (ref 12.0–15.0)
MCH: 29.1 pg (ref 26.0–34.0)
MCH: 30.7 pg (ref 26.0–34.0)
MCHC: 31.1 g/dL (ref 30.0–36.0)
MCHC: 32.7 g/dL (ref 30.0–36.0)
MCV: 93.5 fL (ref 78.0–100.0)
MCV: 93.8 fL (ref 78.0–100.0)
PLATELETS: 231 10*3/uL (ref 150–400)
PLATELETS: 225 10*3/uL (ref 150–400)
RBC: 3.85 MIL/uL — ABNORMAL LOW (ref 3.87–5.11)
RBC: 3.39 MIL/uL — AB (ref 3.87–5.11)
RDW: 14 % (ref 11.5–15.5)
RDW: 14.1 % (ref 11.5–15.5)
WBC: 9.9 10*3/uL (ref 4.0–10.5)
WBC: 5.8 10*3/uL (ref 4.0–10.5)

## 2014-12-10 LAB — COMPREHENSIVE METABOLIC PANEL
ALBUMIN: 3.2 g/dL — AB (ref 3.5–5.0)
ALK PHOS: 56 U/L (ref 38–126)
ALT: 16 U/L (ref 14–54)
ANION GAP: 6 (ref 5–15)
AST: 17 U/L (ref 15–41)
BUN: 32 mg/dL — ABNORMAL HIGH (ref 6–20)
CO2: 25 mmol/L (ref 22–32)
Calcium: 8.6 mg/dL — ABNORMAL LOW (ref 8.9–10.3)
Chloride: 111 mmol/L (ref 101–111)
Creatinine, Ser: 1.35 mg/dL — ABNORMAL HIGH (ref 0.44–1.00)
GFR calc Af Amer: 37 mL/min — ABNORMAL LOW (ref >60)
GFR calc non Af Amer: 32 mL/min — ABNORMAL LOW (ref >60)
Glucose, Bld: 95 mg/dL (ref 65–99)
POTASSIUM: 4.4 mmol/L (ref 3.5–5.1)
Sodium: 142 mmol/L (ref 135–145)
TOTAL PROTEIN: 6.1 g/dL — AB (ref 6.5–8.1)
Total Bilirubin: 0.3 mg/dL (ref 0.3–1.2)

## 2014-12-10 LAB — TYPE AND SCREEN
ABO/RH(D): A NEG
Antibody Screen: NEGATIVE

## 2014-12-10 LAB — I-STAT CG4 LACTIC ACID, ED: Lactic Acid, Venous: 1.48 mmol/L (ref 0.5–2.0)

## 2014-12-10 LAB — POC OCCULT BLOOD, ED: Fecal Occult Bld: POSITIVE — AB

## 2014-12-10 MED ORDER — HYDRALAZINE HCL 10 MG PO TABS
10.0000 mg | ORAL_TABLET | Freq: Two times a day (BID) | ORAL | Status: DC
  Administered 2014-12-11 – 2014-12-12 (×2): 10 mg via ORAL
  Filled 2014-12-10 (×5): qty 1

## 2014-12-10 MED ORDER — ALPRAZOLAM 0.25 MG PO TABS
0.2500 mg | ORAL_TABLET | Freq: Four times a day (QID) | ORAL | Status: DC | PRN
  Filled 2014-12-10: qty 1

## 2014-12-10 MED ORDER — CITALOPRAM HYDROBROMIDE 10 MG PO TABS
10.0000 mg | ORAL_TABLET | Freq: Every day | ORAL | Status: DC
  Administered 2014-12-12: 10 mg via ORAL
  Filled 2014-12-10 (×2): qty 1

## 2014-12-10 MED ORDER — DONEPEZIL HCL 10 MG PO TABS
10.0000 mg | ORAL_TABLET | Freq: Every day | ORAL | Status: DC
  Administered 2014-12-11: 10 mg via ORAL
  Filled 2014-12-10 (×3): qty 1

## 2014-12-10 MED ORDER — SODIUM CHLORIDE 0.9 % IV SOLN
INTRAVENOUS | Status: DC
  Administered 2014-12-10: 18:00:00 via INTRAVENOUS

## 2014-12-10 MED ORDER — MIRTAZAPINE 30 MG PO TABS
30.0000 mg | ORAL_TABLET | Freq: Every day | ORAL | Status: DC
  Administered 2014-12-11: 30 mg via ORAL
  Filled 2014-12-10 (×3): qty 1

## 2014-12-10 MED ORDER — ENSURE ENLIVE PO LIQD
237.0000 mL | Freq: Three times a day (TID) | ORAL | Status: DC
  Administered 2014-12-10 – 2014-12-12 (×4): 237 mL via ORAL

## 2014-12-10 MED ORDER — ALBUTEROL SULFATE (2.5 MG/3ML) 0.083% IN NEBU: 2.5000 mg | INHALATION_SOLUTION | Freq: Four times a day (QID) | RESPIRATORY_TRACT | Status: DC | PRN

## 2014-12-10 MED ORDER — ACETAMINOPHEN 500 MG PO TABS: 500.0000 mg | ORAL_TABLET | Freq: Four times a day (QID) | ORAL | Status: DC | PRN

## 2014-12-10 MED ORDER — PANTOPRAZOLE SODIUM 40 MG PO TBEC
40.0000 mg | DELAYED_RELEASE_TABLET | Freq: Two times a day (BID) | ORAL | Status: DC
  Filled 2014-12-10 (×5): qty 1

## 2014-12-10 MED ORDER — RISPERIDONE 0.25 MG PO TABS
0.2500 mg | ORAL_TABLET | Freq: Every day | ORAL | Status: DC
  Administered 2014-12-12: 0.25 mg via ORAL
  Filled 2014-12-10 (×2): qty 1

## 2014-12-10 MED ORDER — RISPERIDONE 0.5 MG PO TABS
0.5000 mg | ORAL_TABLET | Freq: Every day | ORAL | Status: DC
  Administered 2014-12-11: 0.5 mg via ORAL
  Filled 2014-12-10 (×3): qty 1

## 2014-12-10 NOTE — ED Notes (Signed)
Bed: ZO10 Expected date: 12/10/14 Expected time: 12:11 PM Means of arrival: Ambulance Comments: Rm 22 "Coffee ground stool" reported from [facility], pt also lethargic

## 2014-12-10 NOTE — ED Notes (Signed)
Phlebotomy at bedside.

## 2014-12-10 NOTE — ED Notes (Signed)
Pt. Labs were unsuccessful x 2. Lab tech was called to room 22 for lab draw. Nurse was notified.

## 2014-12-10 NOTE — ED Provider Notes (Signed)
CSN: [id number]     Arrival date & time 12/10/14  1216 History   First MD Initiated Contact with Patient 12/10/14 1310     Chief Complaint  Patient presents with  . Rectal Bleeding     (Consider location/radiation/quality/duration/timing/severity/associated sxs/prior Treatment) HPI [name] is a 81 y.o. female with history of dementia, COPD, hypertension, diverticulosis, presents to emergency department from nursing home with complaint of diarrhea and bright red blood in stool. Patient's symptoms started today. Normal bowel movement yesterday. Patient denies of any complaints. She is confused at baseline. No other complaints. No nausea, vomiting. Hx of the same and recent admission. Pt is DNR  Past Medical History  Diagnosis Date  . GERD (gastroesophageal reflux disease)   . Anxiety   . Dementia   . Alzheimer disease   . COPD (chronic obstructive pulmonary disease)   . Hypertension   . Renal disorder    History reviewed. No pertinent past surgical history. No family history on file. History  Substance Use Topics  . Smoking status: Never Smoker   . Smokeless tobacco: Not on file  . Alcohol Use: No   OB History    No data available     Review of Systems  Unable to perform ROS: Dementia      Allergies  Review of patient's allergies indicates no known allergies.  Home Medications   Prior to Admission medications   Medication Sig Start Date End Date Taking? Authorizing Provider  acetaminophen (TYLENOL) 500 MG tablet Take 500 mg by mouth every 6 (six) hours as needed for mild pain.   Yes Historical Provider, MD  ALPRAZolam (XANAX) 0.25 MG tablet Take 1 tablet (0.25 mg total) by mouth every 6 (six) hours as needed for anxiety (agitation). Not to exceed 6 tablets in 24 hours 12/07/14  Yes Alison Murray, MD  alum & mag hydroxide-simeth (MAALOX/MYLANTA) 200-200-20 MG/5ML suspension Take 30 mLs by mouth every 6 (six) hours as needed for indigestion or heartburn.   Yes  Historical Provider, MD  azelastine (OPTIVAR) 0.05 % ophthalmic solution Place 1 drop into both eyes 2 (two) times daily.   Yes Historical Provider, MD  bumetanide (BUMEX) 1 MG tablet Take 0.5 mg by mouth 2 (two) times a week. Monday and Thursday.   Yes Historical Provider, MD  citalopram (CELEXA) 10 MG tablet Take 10 mg by mouth daily.   Yes Historical Provider, MD  donepezil (ARICEPT) 10 MG tablet Take 10 mg by mouth at bedtime.   Yes Historical Provider, MD  ENSURE (ENSURE) Take 237 mLs by mouth 3 (three) times daily with meals.   Yes Historical Provider, MD  guaifenesin (ROBITUSSIN) 100 MG/5ML syrup Take 200 mg by mouth every 6 (six) hours as needed for cough.    Yes Historical Provider, MD  hydrALAZINE (APRESOLINE) 10 MG tablet Take 1 tablet (10 mg total) by mouth 2 (two) times daily. 12/07/14  Yes Alison Murray, MD  hydrocerin (EUCERIN) CREA Apply 1 application topically 2 (two) times daily.   Yes Historical Provider, MD  loperamide (IMODIUM) 2 MG capsule Take 2 mg by mouth as needed for diarrhea or loose stools (not to exceed 8 doses in 24 hours).   Yes Historical Provider, MD  magnesium hydroxide (MILK OF MAGNESIA) 400 MG/5ML suspension Take 30 mLs by mouth daily as needed for mild constipation.   Yes Historical Provider, MD  mirtazapine (REMERON) 30 MG tablet Take 30 mg by mouth at bedtime.   Yes Historical Provider, MD  Neomycin-Bacitracin-Polymyxin (  TRIPLE ANTIBIOTIC EX) Apply 1 application topically. Apply after cleaning with normal saline, cover with bandaid or gauze and secure with tape. Change as needed until healed for skin tears, abrasions, or minor irritations.   Yes Historical Provider, MD  pantoprazole (PROTONIX) 40 MG tablet Take 1 tablet (40 mg total) by mouth 2 (two) times daily. 12/07/14  Yes Alison Murray, MD  potassium chloride 20 MEQ/15ML (10%) SOLN Take 10 mEq by mouth daily.    Yes Historical Provider, MD  risperiDONE (RISPERDAL) 0.25 MG tablet Take 1 tablet (0.25 mg total)  by mouth daily. 12/07/14  Yes Alison Murray, MD  risperiDONE (RISPERDAL) 0.5 MG tablet Take 1 tablet (0.5 mg total) by mouth at bedtime. 12/07/14  Yes Alison Murray, MD  Vitamin D, Ergocalciferol, (DRISDOL) 50000 UNITS CAPS capsule Take 50,000 Units by mouth every Thursday.   Yes Historical Provider, MD  ciprofloxacin (CIPRO) 250 MG tablet Take 250 mg by mouth 2 (two) times daily. For 7 days    Historical Provider, MD   BP 134/71 mmHg  Pulse 77  Temp(Src) 98.3 F (36.8 C) (Oral)  Resp 15  SpO2 100% Physical Exam  Constitutional: She appears well-developed and well-nourished. No distress.  HENT:  Head: Normocephalic.  Eyes: Conjunctivae are normal.  Neck: Neck supple.  Cardiovascular: Normal rate, regular rhythm and normal heart sounds.   Pulmonary/Chest: Effort normal and breath sounds normal. No respiratory distress. She has no wheezes. She has no rales.  Abdominal: Soft. Bowel sounds are normal. She exhibits no distension. There is no tenderness. There is no rebound.  Genitourinary:  Primary blood per rectum  Musculoskeletal: She exhibits no edema.  Neurological: She is alert.  confused  Skin: Skin is warm and dry.  Psychiatric: She has a normal mood and affect. Her behavior is normal.  Nursing note and vitals reviewed.   ED Course  Procedures (including critical care time) Labs Review Labs Reviewed  POC OCCULT BLOOD, ED - Abnormal; Notable for the following:    Fecal Occult Bld POSITIVE (*)    All other components within normal limits  COMPREHENSIVE METABOLIC PANEL  CBC  I-STAT CG4 LACTIC ACID, ED  TYPE AND SCREEN    Imaging Review No results found.   EKG Interpretation None      MDM   Final diagnoses:  Gastrointestinal hemorrhage, unspecified gastritis, unspecified gastrointestinal hemorrhage type     patient with bright red blood per rectum, from nursing home. Started today, recurrent blisters to the hospital for the same 4 days ago. At this time will get  labs, will monitor.   3:24 PM Hemoglobin at baseline. Vital signs are normal. Abdomen is benign. Will call medicine to monitor. Patient is altered at baseline.  Filed Vitals:   12/10/14 1226 12/10/14 1237  BP:  134/71  Pulse:  77  Temp:  98.3 F (36.8 C)  TempSrc:  Oral  Resp:  15  SpO2: 93% 100%     Jaynie Crumble, PA-C 12/10/14 1551  Rolan Bucco, MD 12/10/14 1604

## 2014-12-10 NOTE — ED Notes (Signed)
Main lab at bedside to collect labs

## 2014-12-10 NOTE — ED Notes (Signed)
Pt from [facility] via EMS-Per EMS, pt had diarrhea this am which staff reports as being "tarry with red spots." Pt has recently been treated for rectal bleeding at this facility. Pt is lethargic and will not respond to verbal commands but staff reports that this has been her baseline at facility, pt has hx of dementia. Pt in NAD. Pt has received 100 cc NS en route

## 2014-12-10 NOTE — ED Notes (Signed)
Attempted to obtain labs w/o success. Phlebotomy notified

## 2014-12-10 NOTE — H&P (Signed)
[facility] History and Physical  [name] ZOX:[id number] DOB: 06/01/1916 DOA: 12/10/2014  Referring personnel: ED PA Trellis Moment PCP: Ron Parker, MD   Chief Complaint: BRBPR  HPI: [name] is a 81 y.o. female  With history of COPD, anxiety, dementia, hypertension who presents to the hospital after she was found to have bright red blood per rectum. She had recent admission on 12/05/2014 with the same complaint. Patient is demented and is unable to provide meaningful history. Much of the history is obtained from ER personnel as well as EMR. Problem occurred today and was insidious. No other complaints reported.  We were consulted for further medical evaluation recommendations.   Review of Systems:  Unable to obtain due to dementia  Past Medical History  Diagnosis Date  . GERD (gastroesophageal reflux disease)   . Anxiety   . Dementia   . Alzheimer disease   . COPD (chronic obstructive pulmonary disease)   . Hypertension   . Renal disorder    History reviewed. No pertinent past surgical history. Social History:  reports that she has never smoked. She does not have any smokeless tobacco history on file. She reports that she does not drink alcohol or use illicit drugs.  No Known Allergies  Family history - unable to obtain due to Dementia  Prior to Admission medications   Medication Sig Start Date End Date Taking? Authorizing Provider  acetaminophen (TYLENOL) 500 MG tablet Take 500 mg by mouth every 6 (six) hours as needed for mild pain.   Yes Historical Provider, MD  ALPRAZolam (XANAX) 0.25 MG tablet Take 1 tablet (0.25 mg total) by mouth every 6 (six) hours as needed for anxiety (agitation). Not to exceed 6 tablets in 24 hours 12/07/14  Yes Alison Murray, MD  alum & mag hydroxide-simeth (MAALOX/MYLANTA) 200-200-20 MG/5ML suspension Take 30 mLs by mouth every 6 (six) hours as needed for indigestion or heartburn.   Yes Historical Provider, MD  azelastine  (OPTIVAR) 0.05 % ophthalmic solution Place 1 drop into both eyes 2 (two) times daily.   Yes Historical Provider, MD  bumetanide (BUMEX) 1 MG tablet Take 0.5 mg by mouth 2 (two) times a week. Monday and Thursday.   Yes Historical Provider, MD  citalopram (CELEXA) 10 MG tablet Take 10 mg by mouth daily.   Yes Historical Provider, MD  donepezil (ARICEPT) 10 MG tablet Take 10 mg by mouth at bedtime.   Yes Historical Provider, MD  ENSURE (ENSURE) Take 237 mLs by mouth 3 (three) times daily with meals.   Yes Historical Provider, MD  guaifenesin (ROBITUSSIN) 100 MG/5ML syrup Take 200 mg by mouth every 6 (six) hours as needed for cough.    Yes Historical Provider, MD  hydrALAZINE (APRESOLINE) 10 MG tablet Take 1 tablet (10 mg total) by mouth 2 (two) times daily. 12/07/14  Yes Alison Murray, MD  hydrocerin (EUCERIN) CREA Apply 1 application topically 2 (two) times daily.   Yes Historical Provider, MD  loperamide (IMODIUM) 2 MG capsule Take 2 mg by mouth as needed for diarrhea or loose stools (not to exceed 8 doses in 24 hours).   Yes Historical Provider, MD  magnesium hydroxide (MILK OF MAGNESIA) 400 MG/5ML suspension Take 30 mLs by mouth daily as needed for mild constipation.   Yes Historical Provider, MD  mirtazapine (REMERON) 30 MG tablet Take 30 mg by mouth at bedtime.   Yes Historical Provider, MD  Neomycin-Bacitracin-Polymyxin (TRIPLE ANTIBIOTIC EX) Apply 1 application topically. Apply after cleaning with normal saline, cover  with bandaid or gauze and secure with tape. Change as needed until healed for skin tears, abrasions, or minor irritations.   Yes Historical Provider, MD  pantoprazole (PROTONIX) 40 MG tablet Take 1 tablet (40 mg total) by mouth 2 (two) times daily. 12/07/14  Yes Alison Murray, MD  potassium chloride 20 MEQ/15ML (10%) SOLN Take 10 mEq by mouth daily.    Yes Historical Provider, MD  risperiDONE (RISPERDAL) 0.25 MG tablet Take 1 tablet (0.25 mg total) by mouth daily. 12/07/14  Yes Alison Murray, MD  risperiDONE (RISPERDAL) 0.5 MG tablet Take 1 tablet (0.5 mg total) by mouth at bedtime. 12/07/14  Yes Alison Murray, MD  Vitamin D, Ergocalciferol, (DRISDOL) 50000 UNITS CAPS capsule Take 50,000 Units by mouth every Thursday.   Yes Historical Provider, MD  ciprofloxacin (CIPRO) 250 MG tablet Take 250 mg by mouth 2 (two) times daily. For 7 days    Historical Provider, MD   Physical Exam: Filed Vitals:   12/10/14 1226 12/10/14 1237 12/10/14 1544  BP:  134/71 158/62  Pulse:  77 69  Temp:  98.3 F (36.8 C)   TempSrc:  Oral   Resp:  15 17  SpO2: 93% 100% 100%    Wt Readings from Last 3 Encounters:  12/06/14 51.7 kg (113 lb 15.7 oz)  06/08/14 45.36 kg (100 lb)  06/07/14 45.36 kg (100 lb)    General:  Appears calm and comfortable, in NAD Eyes: PERRL, normal lids, irises & conjunctiva ENT: grossly normal hearing, lips & tongue Neck: no LAD, masses or thyromegaly Cardiovascular: RRR, no m/r/g. No LE edema. Respiratory: CTA bilaterally, no w/r/r. Normal respiratory effort. Abdomen: soft, nt, nd Skin: no rash or induration seen on limited exam Musculoskeletal: grossly normal tone BUE/BLE Psychiatric: Limited exam due to limited cooperation in patient with dementia Neurologic: No facial asymmetry, moves extremities equally. Limited exam due to limited cooperation in patient with dementia          Labs on Admission:  Basic Metabolic Panel:  Recent Labs Lab 12/05/14 2136 12/06/14 0225 12/07/14 0510 12/10/14 1420  NA 141 141 142 142  K 4.4 4.5 3.9 4.4  CL 109 110 110 111  CO2 GLUCOSE 133* 102* 106* 95  BUN 32* 30* 15 32*  CREATININE 1.71* 1.44* 1.06* 1.35*  CALCIUM 9.1 8.7* 8.4* 8.6*   Liver Function Tests:  Recent Labs Lab 12/05/14 2136 12/10/14 1420  AST 23 17  ALT 16 16  ALKPHOS 63 56  BILITOT 0.4 0.3  PROT 6.3* 6.1*  ALBUMIN 3.6 3.2*   No results for input(s): LIPASE, AMYLASE in the last 168 hours. No results for input(s): AMMONIA in  the last 168 hours. CBC:  Recent Labs Lab 12/05/14 2136  12/06/14 0925 12/06/14 1737 12/07/14 0125 12/07/14 0510 12/10/14 1420  WBC 14.0*  --   --   --   --  7.9 9.9  NEUTROABS 12.3*  --   --   --   --   --   --   HGB 11.6*  < > 10.2* 10.3* 10.7* 11.7* 11.2*  HCT 36.5  < > 32.3* 32.6* 32.8* 36.6 36.0  MCV 93.6  --   --   --   --  94.1 93.5  PLT 218  --   --   --   --  218 231  < > = values in this interval not displayed. Cardiac Enzymes: No results for input(s): CKTOTAL, CKMB, CKMBINDEX, TROPONINI  in the last 168 hours.  BNP (last 3 results) No results for input(s): BNP in the last 8760 hours.  ProBNP (last 3 results) No results for input(s): PROBNP in the last 8760 hours.  CBG: No results for input(s): GLUCAP in the last 168 hours.  Radiological Exams on Admission: No results found.   Assessment/Plan Principal Problem:   BRBPR (bright red blood per rectum) - Patient evaluated recently by GI who recommended observation and supportive care. Per GI's last consult note:  I have discussed the patient's case with her son on the telephone, and we both agree that she is too old to put through nonessential diagnostic testing. If she has brisk hemorrhage in the future, we could consider a bleeding scan. - obtain serial CBC's q 8 hours - Continue twice-daily Protonix  Active Problems:    Anxiety - prn Xanax    Alzheimer's dementia -stable continue patient's home medication regimen aricept and risperdal    COPD (chronic obstructive pulmonary disease) - stable, will place on prn albuterol    HTN (hypertension) - continue hydralazine - continue to monitor    Acute kidney injury - Most likely prerenal etiology - gently hydrate and reassess next am.  Code Status: full DVT Prophylaxis:SCD's avoid heparin or lovenox Family Communication: no family at bedside Disposition Plan: Med surg in observation  Time spent: > 35 minutes  Penny Pia [facility] Pager [phone]

## 2014-12-11 ENCOUNTER — Encounter (HOSPITAL_COMMUNITY): Payer: Self-pay | Admitting: *Deleted

## 2014-12-11 DIAGNOSIS — K625 Hemorrhage of anus and rectum: Secondary | ICD-10-CM | POA: Diagnosis not present

## 2014-12-11 LAB — BASIC METABOLIC PANEL
Anion gap: 5 (ref 5–15)
BUN: 26 mg/dL — AB (ref 6–20)
CO2: 26 mmol/L (ref 22–32)
Calcium: 8.2 mg/dL — ABNORMAL LOW (ref 8.9–10.3)
Chloride: 110 mmol/L (ref 101–111)
Creatinine, Ser: 1.16 mg/dL — ABNORMAL HIGH (ref 0.44–1.00)
GFR calc Af Amer: 45 mL/min — ABNORMAL LOW (ref >60)
GFR, EST NON AFRICAN AMERICAN: 38 mL/min — AB (ref >60)
GLUCOSE: 97 mg/dL (ref 65–99)
POTASSIUM: 4 mmol/L (ref 3.5–5.1)
Sodium: 141 mmol/L (ref 135–145)

## 2014-12-11 LAB — CBC
HCT: 32 % — ABNORMAL LOW (ref 36.0–46.0)
HEMATOCRIT: 33.5 % — AB (ref 36.0–46.0)
HEMOGLOBIN: 10.1 g/dL — AB (ref 12.0–15.0)
HEMOGLOBIN: 10.8 g/dL — AB (ref 12.0–15.0)
MCH: 29.4 pg (ref 26.0–34.0)
MCH: 30.6 pg (ref 26.0–34.0)
MCHC: 31.6 g/dL (ref 30.0–36.0)
MCHC: 32.2 g/dL (ref 30.0–36.0)
MCV: 93 fL (ref 78.0–100.0)
MCV: 94.9 fL (ref 78.0–100.0)
Platelets: 203 10*3/uL (ref 150–400)
Platelets: 215 10*3/uL (ref 150–400)
RBC: 3.44 MIL/uL — ABNORMAL LOW (ref 3.87–5.11)
RBC: 3.53 MIL/uL — ABNORMAL LOW (ref 3.87–5.11)
RDW: 14 % (ref 11.5–15.5)
RDW: 14.2 % (ref 11.5–15.5)
WBC: 5.4 10*3/uL (ref 4.0–10.5)
WBC: 4.5 10*3/uL (ref 4.0–10.5)

## 2014-12-11 NOTE — Progress Notes (Signed)
[facility] PROGRESS NOTE  [name] ZOX:[id number] DOB: 04-08-1917 DOA: 12/10/2014 PCP: Ron Parker, MD  Assessment/Plan: Principal Problem:   BRBPR (bright red blood per rectum) - No more episodes reported. Last hemoglobin stable at 10.8 most likely discharge next a.m. if no acute problems. - Reassess CBC next a.m.  Active Problems:   Anxiety - prn Xanax   Alzheimer's dementia -stable continue patient's home medication regimen aricept and risperdal   COPD (chronic obstructive pulmonary disease) - stable, will place on prn albuterol   HTN (hypertension) - continue hydralazine - continue to monitor   Acute kidney injury - Most likely prerenal etiology - gently hydrate and reassess next am.  Code Status: DNR Family Communication:  None at bedside Disposition Plan: Most likely d/c next am   Consultants:  None  Procedures:  None  Antibiotics:  None  HPI/Subjective: Pt does not interact with examiner when questioned. Stairs at ceiling  Objective: Filed Vitals:   12/11/14 1321  BP: 129/61  Pulse: 60  Temp: 97.4 F (36.3 C)  Resp: 16    Intake/Output Summary (Last 24 hours) at 12/11/14 1813 Last data filed at 12/11/14 1345  Gross per 24 hour  Intake 1015.84 ml  Output      0 ml  Net 1015.84 ml   There were no vitals filed for this visit.  Exam:   General:  Pt in nad, alert and awake  Cardiovascular: rrr, no mrg  Respiratory: cta bl, no wheezes, equal chest rise  Abdomen: soft, ND, no guarding  Musculoskeletal: no cyanosis or clubbing   Data Reviewed: Basic Metabolic Panel:  Recent Labs Lab 12/05/14 2136 12/06/14 0225 12/07/14 0510 12/10/14 1420 12/11/14 0557  NA 141 141 142 142 141  K 4.4 4.5 3.9 4.4 4.0  CL 109 110 110 111 110  CO2 25 26 23 25 26   GLUCOSE 133* 102* 106* 95 97  BUN 32* 30* 15 32* 26*  CREATININE 1.71* 1.44* 1.06* 1.35* 1.16*  CALCIUM 9.1 8.7* 8.4* 8.6* 8.2*   Liver Function Tests:  Recent  Labs Lab 12/05/14 2136 12/10/14 1420  AST 23 17  ALT 16 16  ALKPHOS 63 56  BILITOT 0.4 0.3  PROT 6.3* 6.1*  ALBUMIN 3.6 3.2*   No results for input(s): LIPASE, AMYLASE in the last 168 hours. No results for input(s): AMMONIA in the last 168 hours. CBC:  Recent Labs Lab 12/05/14 2136  12/07/14 0510 12/10/14 1420 12/10/14 2204 12/11/14 0557 12/11/14 1351  WBC 14.0*  --  7.9 9.9 5.8 5.4 4.5  NEUTROABS 12.3*  --   --   --   --   --   --   HGB 11.6*  < > 11.7* 11.2* 10.4* 10.1* 10.8*  HCT 36.5  < > 36.6 36.0 31.8* 32.0* 33.5*  MCV 93.6  --  94.1 93.5 93.8 93.0 94.9  PLT 218  --  218 231 225 203 215  < > = values in this interval not displayed. Cardiac Enzymes: No results for input(s): CKTOTAL, CKMB, CKMBINDEX, TROPONINI in the last 168 hours. BNP (last 3 results) No results for input(s): BNP in the last 8760 hours.  ProBNP (last 3 results) No results for input(s): PROBNP in the last 8760 hours.  CBG: No results for input(s): GLUCAP in the last 168 hours.  Recent Results (from the past 240 hour(s))  MRSA PCR Screening     Status: None   Collection Time: 12/06/14  1:38 AM  Result Value Ref Range Status  MRSA by PCR NEGATIVE NEGATIVE Final    Comment:        The GeneXpert MRSA Assay (FDA approved for NASAL specimens only), is one component of a comprehensive MRSA colonization surveillance program. It is not intended to diagnose MRSA infection nor to guide or monitor treatment for MRSA infections.      Studies: No results found.  Scheduled Meds: . citalopram  10 mg Oral Daily  . donepezil  10 mg Oral QHS  . feeding supplement (ENSURE ENLIVE)  237 mL Oral TID WC  . hydrALAZINE  10 mg Oral BID  . mirtazapine  30 mg Oral QHS  . pantoprazole  40 mg Oral BID  . risperiDONE  0.25 mg Oral Daily  . risperiDONE  0.5 mg Oral QHS   Continuous Infusions: . sodium chloride 50 mL/hr at 12/10/14 1740     Time spent: > 35 minutes    Penny Pia  [facility] Pager [phone]. If 7PM-7AM, please contact night-coverage at [email or web address], password [facility] 12/11/2014, 6:13 PM

## 2014-12-11 NOTE — Progress Notes (Signed)
Pt alert and nonverbal. Pt smiled when spoken to. She also shook her head yes or no appropriately to questions I asked. Pt then closed her eyes and wouldn't open them again.

## 2014-12-12 DIAGNOSIS — K625 Hemorrhage of anus and rectum: Secondary | ICD-10-CM | POA: Diagnosis not present

## 2014-12-12 LAB — CBC
HCT: 30.3 % — ABNORMAL LOW (ref 36.0–46.0)
Hemoglobin: 9.8 g/dL — ABNORMAL LOW (ref 12.0–15.0)
MCH: 29.9 pg (ref 26.0–34.0)
MCHC: 32.3 g/dL (ref 30.0–36.0)
MCV: 92.4 fL (ref 78.0–100.0)
Platelets: 198 10*3/uL (ref 150–400)
RBC: 3.28 MIL/uL — ABNORMAL LOW (ref 3.87–5.11)
RDW: 13.8 % (ref 11.5–15.5)
WBC: 3.9 10*3/uL — ABNORMAL LOW (ref 4.0–10.5)

## 2014-12-12 NOTE — Care Management Note (Signed)
Case Management Note  Patient Details  Name: Airlie Blumenberg MRN: [id number] Date of Birth: [birth year]  Subjective/Objective:  81 y/o f admitted w/rectal bleed.From ALF.                  Action/Plan:No d/c needs or orders. Return back to ALF.   Expected Discharge Date:                 Expected Discharge Plan:  Assisted Living / Rest Home  In-House Referral:  Clinical Social Work  Discharge planning Services  CM Consult  Post Acute Care Choice:    Choice offered to:     DME Arranged:    DME Agency:     HH Arranged:    HH Agency:     Status of Service:  Completed, signed off  Medicare Important Message Given:    Date Medicare IM Given:    Medicare IM give by:    Date Additional Medicare IM Given:    Additional Medicare Important Message give by:     If discussed at Long Length of Stay Meetings, dates discussed:    Additional Comments:  Lanier Clam, RN 12/12/2014, 12:02 PM

## 2014-12-12 NOTE — Progress Notes (Signed)
Pt discharged to [facility] via [facility].

## 2014-12-12 NOTE — Clinical Social Work Note (Signed)
Clinical Social Work Assessment  Patient Details  Name: [name] MRN: [id number] Date of Birth: 1916-09-10  Date of referral:  12/12/14               Reason for consult:  Facility Placement                Permission sought to share information with:  Family Supports Permission granted to share information::  Yes, Verbal Permission Granted  Name::     Building surveyor::     Relationship::  son  Contact Information:     Housing/Transportation Living arrangements for the past 2 months:  Assisted Dealer of Information:  Adult Children Patient Interpreter Needed:  None Criminal Activity/Legal Involvement Pertinent to Current Situation/Hospitalization:  No - Comment as needed Significant Relationships:  Adult Children Lives with:  Facility Resident Do you feel safe going back to the place where you live?  Yes Need for family participation in patient care:  Yes (Comment)  Care giving concerns:  Patient to return to ALF.   Social Worker assessment / plan:  CSW received referral due to patient being admitted from ALF. CSW spoke with son who confirms patient lives at [facility]. CSW faxed FL2 and DC summary to ALF and spoke with [place] who reports information was received and they are ready to accept patient. MD did not leave script for Xanax but facility reports patient still has medication at ALF so no script needed for patient to return.  Son agreeable to DC today and prefers [facility] to transport patient back to ALF.  CSW prepared DC packet with FL2, DC summary, [facility] forms and DNR form included. RN aware of DC plans and requests 2 pm pick up.  [facility] arranged; request #: [id number].  CSW is signing off but available if needed.  Employment status:  Retired Database administrator, [employer] [place] PT Recommendations:  Not assessed at this time Information / Referral to community resources:  Other (Comment Required) (Will return to  ALF)  Patient/Family's Response to care:  Son appreciative of hospital staff and reports he has been kept informed. Son agreeable to DC today.  Patient/Family's Understanding of and Emotional Response to Diagnosis, Current Treatment, and Prognosis:  Son reports he is considering hospice care for patient because he has seen a decline in patient. Son reports friends have used hospice and he plans on meeting with them in order to get their opinion about what he should do for patient's care. Son reports he wants patient to be comfortable and not go through procedures that are not necessary.  Emotional Assessment Appearance:  Appears stated age Attitude/Demeanor/Rapport:  Lethargic Affect (typically observed):  Flat Orientation:  Oriented to Self Alcohol / Substance use:  Not Applicable Psych involvement (Current and /or in the community):  No (Comment)  Discharge Needs  Concerns to be addressed:  No discharge needs identified Readmission within the last 30 days:  Yes Current discharge risk:  Chronically ill Barriers to Discharge:  No Barriers Identified   Marnee Spring, LCSW 12/12/2014, 12:36 PM [phone]

## 2014-12-12 NOTE — Progress Notes (Signed)
Paged Dr. Cena Benton re: bladder scan and unable to in and out patient. Dr. Cena Benton said to keep an eye on her. Will continue to monitor.

## 2014-12-12 NOTE — Progress Notes (Addendum)
In attempting to get patient ready for transport, patient became combative. Tried to give pt .25 of xanax crushed and mixed in with Ensure. Pt knocked it out of my hand. Unable to get pt to void after bladder scan showed . Will not relax legs to let us in and out cath her. Will continue to monitor pt

## 2014-12-12 NOTE — Discharge Summary (Signed)
Physician Discharge Summary  [name] ZOX:[id number] DOB: 23-May-1916 DOA: 12/10/2014  PCP: Ron Parker, MD  Admit date: 12/10/2014 Discharge date: 12/12/2014  Time spent: > 35 minutes  Recommendations for Outpatient Follow-up:  1. Continue to monitor CBCs weekly. 2. Son did not want aggressive workup (colonoscopy) 3. Would consider palliative evaluation when patient is back at ALF  Discharge Diagnoses:  Principal Problem:   BRBPR (bright red blood per rectum) Active Problems:   Anxiety   Alzheimer's dementia   COPD (chronic obstructive pulmonary disease)   HTN (hypertension)   Acute kidney injury   Discharge Condition: stable  Diet recommendation: regular diet  There were no vitals filed for this visit.  History of present illness:  Patient is a 81 year old with history of COPD, anxiety, hypertension, dementia who presented to the hospital after having bright red blood per rectum.  Hospital Course:  Bright red blood per rectum - Resolved patient had no bloody bowel movements reported to me while hospitalized. This was discussed with nursing -No aggressive workup per prior GI evaluation on last admission - Recommend palliative evaluation moving forward, this was discussed with son who is in agreement. - I think most recent decrease in hemoglobin level is dilutional as all other values decreased as well. - avoid aspirin or nsaids  Procedures:  None  Consultations:  None  Discharge Exam: Filed Vitals:   12/12/14 1119  BP: 172/58  Pulse: 58  Temp: 98 F (36.7 C)  Resp: 16    General: Pt in nad Cardiovascular: rrr, no mrg Respiratory: cta bl, no wheezes  Discharge Instructions   Discharge Instructions    Call MD for:  difficulty breathing, headache or visual disturbances    Complete by:  As directed      Call MD for:  severe uncontrolled pain    Complete by:  As directed      Call MD for:  temperature >100.4    Complete by:  As directed       Diet - low sodium heart healthy    Complete by:  As directed      Increase activity slowly    Complete by:  As directed           Current Discharge Medication List    CONTINUE these medications which have NOT CHANGED   Details  acetaminophen (TYLENOL) 500 MG tablet Take 500 mg by mouth every 6 (six) hours as needed for mild pain.    ALPRAZolam (XANAX) 0.25 MG tablet Take 1 tablet (0.25 mg total) by mouth every 6 (six) hours as needed for anxiety (agitation). Not to exceed 6 tablets in 24 hours Qty: 30 tablet, Refills: 0    alum & mag hydroxide-simeth (MAALOX/MYLANTA) 200-200-20 MG/5ML suspension Take 30 mLs by mouth every 6 (six) hours as needed for indigestion or heartburn.    azelastine (OPTIVAR) 0.05 % ophthalmic solution Place 1 drop into both eyes 2 (two) times daily.    bumetanide (BUMEX) 1 MG tablet Take 0.5 mg by mouth 2 (two) times a week. Monday and Thursday.    citalopram (CELEXA) 10 MG tablet Take 10 mg by mouth daily.    donepezil (ARICEPT) 10 MG tablet Take 10 mg by mouth at bedtime.    ENSURE (ENSURE) Take 237 mLs by mouth 3 (three) times daily with meals.    guaifenesin (ROBITUSSIN) 100 MG/5ML syrup Take 200 mg by mouth every 6 (six) hours as needed for cough.     hydrALAZINE (APRESOLINE) 10 MG tablet Take 1  tablet (10 mg total) by mouth 2 (two) times daily. Qty: 60 tablet, Refills: 0    hydrocerin (EUCERIN) CREA Apply 1 application topically 2 (two) times daily.    loperamide (IMODIUM) 2 MG capsule Take 2 mg by mouth as needed for diarrhea or loose stools (not to exceed 8 doses in 24 hours).    magnesium hydroxide (MILK OF MAGNESIA) 400 MG/5ML suspension Take 30 mLs by mouth daily as needed for mild constipation.    mirtazapine (REMERON) 30 MG tablet Take 30 mg by mouth at bedtime.    Neomycin-Bacitracin-Polymyxin (TRIPLE ANTIBIOTIC EX) Apply 1 application topically. Apply after cleaning with normal saline, cover with bandaid or gauze and secure with tape.  Change as needed until healed for skin tears, abrasions, or minor irritations.    pantoprazole (PROTONIX) 40 MG tablet Take 1 tablet (40 mg total) by mouth 2 (two) times daily. Qty: 60 tablet, Refills: 0    potassium chloride 20 MEQ/15ML (10%) SOLN Take 10 mEq by mouth daily.     !! risperiDONE (RISPERDAL) 0.25 MG tablet Take 1 tablet (0.25 mg total) by mouth daily. Qty: 15 tablet, Refills: 0    !! risperiDONE (RISPERDAL) 0.5 MG tablet Take 1 tablet (0.5 mg total) by mouth at bedtime. Qty: 15 tablet, Refills: 0    Vitamin D, Ergocalciferol, (DRISDOL) 50000 UNITS CAPS capsule Take 50,000 Units by mouth every Thursday.     !! - Potential duplicate medications found. Please discuss with provider.    STOP taking these medications     ciprofloxacin (CIPRO) 250 MG tablet        No Known Allergies    The results of significant diagnostics from this hospitalization (including imaging, microbiology, ancillary and laboratory) are listed below for reference.    Significant Diagnostic Studies: Dg Elbow Complete Right  12/08/2014   CLINICAL DATA:  Status post fall out of bed onto right arm, with bruising at the mid right arm and forearm. Initial encounter.  EXAM: RIGHT ELBOW - COMPLETE 3+ VIEW  COMPARISON:  None.  FINDINGS: There is no evidence of fracture or dislocation. The visualized joint spaces are preserved. No obvious elbow joint effusion is identified. The soft tissues are unremarkable in appearance.  IMPRESSION: No evidence of fracture or dislocation.   Electronically Signed   By: Roanna Raider M.D.   On: 12/08/2014 02:54   Dg Forearm Right  12/08/2014   CLINICAL DATA:  Status post fall out of bed onto right arm, with right forearm bruising. Initial encounter.  EXAM: RIGHT FOREARM - 2 VIEW  COMPARISON:  None.  FINDINGS: There is no evidence of fracture or dislocation. The radius and ulna appear intact. Slight apparent expansion of the distal radius likely reflects remote injury. No  definite soft tissue abnormalities are characterized on radiograph. The carpal rows appear grossly intact, and demonstrate normal alignment. The elbow joint is grossly unremarkable, though better assessed on concurrent elbow radiographs.  IMPRESSION: No evidence of fracture or dislocation.   Electronically Signed   By: Roanna Raider M.D.   On: 12/08/2014 02:55   Ct Head Wo Contrast  11/19/2014   CLINICAL DATA:  Status post unwitnessed fall. Concern for head or cervical spine injury. Initial encounter.  EXAM: CT HEAD WITHOUT CONTRAST  CT CERVICAL SPINE WITHOUT CONTRAST  TECHNIQUE: Multidetector CT imaging of the head and cervical spine was performed following the standard protocol without intravenous contrast. Multiplanar CT image reconstructions of the cervical spine were also generated.  COMPARISON:  CT of  the head performed 10/29/2014, and CT of the head and cervical spine performed 04/25/2013  FINDINGS: CT HEAD FINDINGS  There is no evidence of acute infarction, mass lesion, or intra- or extra-axial hemorrhage on CT.  Prominence of the ventricles and sulci reflects moderate cortical volume loss. Cerebellar atrophy is noted. Diffuse periventricular and subcortical white matter change likely reflects small vessel ischemic microangiopathy. Chronic ischemic change is noted at the basal ganglia bilaterally.  The brainstem and fourth ventricle are within normal limits. The cerebral hemispheres demonstrate grossly normal gray-white differentiation. No mass effect or midline shift is seen.  There is no evidence of fracture; visualized osseous structures are unremarkable in appearance. The orbits are within normal limits. There is complete opacification of the right mastoid air cells. The paranasal sinuses and left mastoid air cells are well-aerated. No significant soft tissue abnormalities are seen.  CT CERVICAL SPINE FINDINGS  There is no evidence of acute fracture or subluxation. Vertebral bodies demonstrate normal  height. There is stable grade 1 retrolisthesis of C3 on C4, and stable grade 1 anterolisthesis of C5 on C6 and of C6 on C7. Disc space narrowing is noted at C3-C4 and C6-C7. Mild underlying facet disease is noted. Prevertebral soft tissues are within normal limits.  The thyroid gland is unremarkable in appearance. Prominent scarring is noted at the lung apices, with associated calcification. Mild calcification is seen at the carotid bifurcations bilaterally.  IMPRESSION: 1. No evidence of traumatic intracranial injury or fracture. 2. No evidence of acute fracture or subluxation along the cervical spine. 3. Moderate cortical volume loss and diffuse small vessel ischemic microangiopathy. Chronic ischemic change at the basal ganglia bilaterally. 4. Complete opacification of the right mastoid air cells. 5. Mild degenerative change along the cervical spine. 6. Prominent scarring at the lung apices, with associated calcification. 7. Mild calcification at the carotid bifurcations bilaterally.   Electronically Signed   By: Roanna Raider M.D.   On: 11/19/2014 01:19   Ct Cervical Spine Wo Contrast  11/19/2014   CLINICAL DATA:  Status post unwitnessed fall. Concern for head or cervical spine injury. Initial encounter.  EXAM: CT HEAD WITHOUT CONTRAST  CT CERVICAL SPINE WITHOUT CONTRAST  TECHNIQUE: Multidetector CT imaging of the head and cervical spine was performed following the standard protocol without intravenous contrast. Multiplanar CT image reconstructions of the cervical spine were also generated.  COMPARISON:  CT of the head performed 10/29/2014, and CT of the head and cervical spine performed 04/25/2013  FINDINGS: CT HEAD FINDINGS  There is no evidence of acute infarction, mass lesion, or intra- or extra-axial hemorrhage on CT.  Prominence of the ventricles and sulci reflects moderate cortical volume loss. Cerebellar atrophy is noted. Diffuse periventricular and subcortical white matter change likely reflects small  vessel ischemic microangiopathy. Chronic ischemic change is noted at the basal ganglia bilaterally.  The brainstem and fourth ventricle are within normal limits. The cerebral hemispheres demonstrate grossly normal gray-white differentiation. No mass effect or midline shift is seen.  There is no evidence of fracture; visualized osseous structures are unremarkable in appearance. The orbits are within normal limits. There is complete opacification of the right mastoid air cells. The paranasal sinuses and left mastoid air cells are well-aerated. No significant soft tissue abnormalities are seen.  CT CERVICAL SPINE FINDINGS  There is no evidence of acute fracture or subluxation. Vertebral bodies demonstrate normal height. There is stable grade 1 retrolisthesis of C3 on C4, and stable grade 1 anterolisthesis of C5 on C6 and  of C6 on C7. Disc space narrowing is noted at C3-C4 and C6-C7. Mild underlying facet disease is noted. Prevertebral soft tissues are within normal limits.  The thyroid gland is unremarkable in appearance. Prominent scarring is noted at the lung apices, with associated calcification. Mild calcification is seen at the carotid bifurcations bilaterally.  IMPRESSION: 1. No evidence of traumatic intracranial injury or fracture. 2. No evidence of acute fracture or subluxation along the cervical spine. 3. Moderate cortical volume loss and diffuse small vessel ischemic microangiopathy. Chronic ischemic change at the basal ganglia bilaterally. 4. Complete opacification of the right mastoid air cells. 5. Mild degenerative change along the cervical spine. 6. Prominent scarring at the lung apices, with associated calcification. 7. Mild calcification at the carotid bifurcations bilaterally.   Electronically Signed   By: Roanna Raider M.D.   On: 11/19/2014 01:19   Dg Chest Port 1 View  12/06/2014   CLINICAL DATA:  Blood in stool. History of COPD, hypertension, Alzheimer's/ dementia.  EXAM: PORTABLE CHEST - 1 VIEW   COMPARISON:  Chest radiograph July 02, 2014  FINDINGS: Cardiac silhouette appears mildly enlarged, unchanged. Tortuous calcified aorta. Mild chronic interstitial changes and increased lung volumes consistent with COPD without pleural effusion or focal consolidation. Apical pleural thickening bold LEFT lung base atelectasis/scarring. No pneumothorax. Patient is osteopenic. Old RIGHT humeral head and RIGHT mid clavicle fracture. Old bilateral rib fractures.  IMPRESSION: Mild cardiomegaly and COPD.   Electronically Signed   By: Awilda Metro M.D.   On: 12/06/2014 01:00   Dg Humerus Right  12/08/2014   CLINICAL DATA:  Status post fall out of bed onto right arm, with bruising at the mid right arm. Initial encounter.  EXAM: RIGHT HUMERUS - 2+ VIEW  COMPARISON:  None.  FINDINGS: There is no evidence of fracture or dislocation. There is mild chronic deformity involving the humeral head, likely reflecting remote injury. The elbow joint is incompletely assessed, but appears grossly unremarkable. Known soft tissue injury is not well characterized on radiograph.  The right humeral head remains seated at the glenoid fossa. The right acromioclavicular joint is grossly unremarkable in appearance.  IMPRESSION: No evidence of acute fracture or dislocation.   Electronically Signed   By: Roanna Raider M.D.   On: 12/08/2014 02:53    Microbiology: Recent Results (from the past 240 hour(s))  MRSA PCR Screening     Status: None   Collection Time: 12/06/14  1:38 AM  Result Value Ref Range Status   MRSA by PCR NEGATIVE NEGATIVE Final    Comment:        The GeneXpert MRSA Assay (FDA approved for NASAL specimens only), is one component of a comprehensive MRSA colonization surveillance program. It is not intended to diagnose MRSA infection nor to guide or monitor treatment for MRSA infections.      Labs: Basic Metabolic Panel:  Recent Labs Lab 12/05/14 2136 12/06/14 0225 12/07/14 0510 12/10/14 1420  12/11/14 0557  NA 141 141 142 142 141  K 4.4 4.5 3.9 4.4 4.0  CL 109 110 110 111 110  CO2 25 26 23 25 26   GLUCOSE 133* 102* 106* 95 97  BUN 32* 30* 15 32* 26*  CREATININE 1.71* 1.44* 1.06* 1.35* 1.16*  CALCIUM 9.1 8.7* 8.4* 8.6* 8.2*   Liver Function Tests:  Recent Labs Lab 12/05/14 2136 12/10/14 1420  AST 23 17  ALT 16 16  ALKPHOS 63 56  BILITOT 0.4 0.3  PROT 6.3* 6.1*  ALBUMIN 3.6 3.2*  No results for input(s): LIPASE, AMYLASE in the last 168 hours. No results for input(s): AMMONIA in the last 168 hours. CBC:  Recent Labs Lab 12/05/14 2136  12/10/14 1420 12/10/14 2204 12/11/14 0557 12/11/14 1351 12/12/14 0529  WBC 14.0*  < > 9.9 5.8 5.4 4.5 3.9*  NEUTROABS 12.3*  --   --   --   --   --   --   HGB 11.6*  < > 11.2* 10.4* 10.1* 10.8* 9.8*  HCT 36.5  < > 36.0 31.8* 32.0* 33.5* 30.3*  MCV 93.6  < > 93.5 93.8 93.0 94.9 92.4  PLT 218  < > 231 225 203 215 198  < > = values in this interval not displayed. Cardiac Enzymes: No results for input(s): CKTOTAL, CKMB, CKMBINDEX, TROPONINI in the last 168 hours. BNP: BNP (last 3 results) No results for input(s): BNP in the last 8760 hours.  ProBNP (last 3 results) No results for input(s): PROBNP in the last 8760 hours.  CBG: No results for input(s): GLUCAP in the last 168 hours.   Signed:  Penny Pia  [facility] 12/12/2014, 11:20 AM

## 2015-02-26 ENCOUNTER — Emergency Department (HOSPITAL_COMMUNITY): Payer: Medicare Other

## 2015-02-26 ENCOUNTER — Emergency Department (HOSPITAL_COMMUNITY)
Admission: EM | Admit: 2015-02-26 | Discharge: 2015-02-26 | Disposition: A | Discharge status: Home / Self Care | Payer: Medicare Other | Attending: Emergency Medicine | Admitting: Emergency Medicine

## 2015-02-26 ENCOUNTER — Encounter (HOSPITAL_COMMUNITY): Payer: Self-pay | Admitting: Emergency Medicine

## 2015-02-26 DIAGNOSIS — W19XXXA Unspecified fall, initial encounter: Secondary | ICD-10-CM

## 2015-02-26 DIAGNOSIS — Y92129 Unspecified place in nursing home as the place of occurrence of the external cause: Secondary | ICD-10-CM | POA: Insufficient documentation

## 2015-02-26 DIAGNOSIS — Z043 Encounter for examination and observation following other accident: Secondary | ICD-10-CM | POA: Insufficient documentation

## 2015-02-26 DIAGNOSIS — F419 Anxiety disorder, unspecified: Secondary | ICD-10-CM | POA: Insufficient documentation

## 2015-02-26 DIAGNOSIS — K219 Gastro-esophageal reflux disease without esophagitis: Secondary | ICD-10-CM | POA: Diagnosis not present

## 2015-02-26 DIAGNOSIS — Z79899 Other long term (current) drug therapy: Secondary | ICD-10-CM | POA: Insufficient documentation

## 2015-02-26 DIAGNOSIS — I10 Essential (primary) hypertension: Secondary | ICD-10-CM | POA: Diagnosis not present

## 2015-02-26 DIAGNOSIS — W06XXXA Fall from bed, initial encounter: Secondary | ICD-10-CM | POA: Insufficient documentation

## 2015-02-26 DIAGNOSIS — J449 Chronic obstructive pulmonary disease, unspecified: Secondary | ICD-10-CM | POA: Diagnosis not present

## 2015-02-26 DIAGNOSIS — Y998 Other external cause status: Secondary | ICD-10-CM | POA: Insufficient documentation

## 2015-02-26 DIAGNOSIS — F0280 Dementia in other diseases classified elsewhere without behavioral disturbance: Secondary | ICD-10-CM | POA: Diagnosis not present

## 2015-02-26 DIAGNOSIS — Y9389 Activity, other specified: Secondary | ICD-10-CM | POA: Diagnosis not present

## 2015-02-26 DIAGNOSIS — G309 Alzheimer's disease, unspecified: Secondary | ICD-10-CM | POA: Insufficient documentation

## 2015-02-26 DIAGNOSIS — Z87448 Personal history of other diseases of urinary system: Secondary | ICD-10-CM | POA: Diagnosis not present

## 2015-02-26 NOTE — ED Notes (Signed)
Pt here via EMS from [facility]. EMS states pt fell about 1 foot out of bed onto foam pad next to bed. No obvious injuries. Facility sent pt here out of facility protocol because pt's son could not be contacted. Pt appears to not be in distress or discomfort at this time. Pt has dementia and per facility pt is at her baseline- pt is for the most part non verbal

## 2015-02-26 NOTE — ED Notes (Signed)
[facility] requested for patient transport 

## 2015-02-26 NOTE — ED Provider Notes (Signed)
CSN: [id number]   Arrival date & time 02/26/15 0132  History  By signing my name below, I, Bethel Born, attest that this documentation has been prepared under the direction and in the presence of [place] Prout, MD. Electronically Signed: Bethel Born, ED Scribe. 02/26/2015. 3:17 AM.  Chief Complaint  Patient presents with  . Fall    out of bed, about 1 foot, onto a thick foam pad.    Level V caveat secondary to dementia and patient's primarily non-verbal status HPI Patient is a 81 y.o. female presenting with fall. The history is provided by the EMS personnel. The history is limited by the condition of the patient. No language interpreter was used.  Fall This is a new problem. The current episode started 1 to 2 hours ago. The problem occurs constantly. The problem has not changed since onset.Nothing aggravates the symptoms. Nothing relieves the symptoms. She has tried nothing for the symptoms. The treatment provided no relief.   Brought in by EMS from [facility], Faydra Korman is a 81 y.o. female with history of dementia and Alzheimer's disease who presents to the Emergency Department complaining of an unwitnessed fall tonight. Per EMS pt fell less than 1 foot from bed onto a pad. Pt had no apparent injuries but was sent to the ED because her facility could not reach her son.   Past Medical History  Diagnosis Date  . GERD (gastroesophageal reflux disease)   . Anxiety   . Dementia   . Alzheimer disease   . COPD (chronic obstructive pulmonary disease) (HCC)   . Hypertension   . Renal disorder     History reviewed. No pertinent past surgical history.  No family history on file.  Social History  Substance Use Topics  . Smoking status: Never Smoker   . Smokeless tobacco: None  . Alcohol Use: No     Review of Systems  Unable to perform ROS: Patient nonverbal   Home Medications   Prior to Admission medications   Medication Sig Start Date End Date Taking? Authorizing  Provider  acetaminophen (TYLENOL) 500 MG tablet Take 500 mg by mouth every 6 (six) hours as needed for mild pain.    Historical Provider, MD  ALPRAZolam Prudy Feeler) 0.25 MG tablet Take 1 tablet (0.25 mg total) by mouth every 6 (six) hours as needed for anxiety (agitation). Not to exceed 6 tablets in 24 hours 12/07/14   Alison Murray, MD  alum & mag hydroxide-simeth (MAALOX/MYLANTA) 200-200-20 MG/5ML suspension Take 30 mLs by mouth every 6 (six) hours as needed for indigestion or heartburn.    Historical Provider, MD  azelastine (OPTIVAR) 0.05 % ophthalmic solution Place 1 drop into both eyes 2 (two) times daily.    Historical Provider, MD  bumetanide (BUMEX) 1 MG tablet Take 0.5 mg by mouth 2 (two) times a week. Monday and Thursday.    Historical Provider, MD  citalopram (CELEXA) 10 MG tablet Take 10 mg by mouth daily.    Historical Provider, MD  donepezil (ARICEPT) 10 MG tablet Take 10 mg by mouth at bedtime.    Historical Provider, MD  ENSURE (ENSURE) Take 237 mLs by mouth 3 (three) times daily with meals.    Historical Provider, MD  guaifenesin (ROBITUSSIN) 100 MG/5ML syrup Take 200 mg by mouth every 6 (six) hours as needed for cough.     Historical Provider, MD  hydrALAZINE (APRESOLINE) 10 MG tablet Take 1 tablet (10 mg total) by mouth 2 (two) times daily. 12/07/14  Alison Murray, MD  hydrocerin (EUCERIN) CREA Apply 1 application topically 2 (two) times daily.    Historical Provider, MD  loperamide (IMODIUM) 2 MG capsule Take 2 mg by mouth as needed for diarrhea or loose stools (not to exceed 8 doses in 24 hours).    Historical Provider, MD  magnesium hydroxide (MILK OF MAGNESIA) 400 MG/5ML suspension Take 30 mLs by mouth daily as needed for mild constipation.    Historical Provider, MD  mirtazapine (REMERON) 30 MG tablet Take 30 mg by mouth at bedtime.    Historical Provider, MD  Neomycin-Bacitracin-Polymyxin (TRIPLE ANTIBIOTIC EX) Apply 1 application topically. Apply after cleaning with normal  saline, cover with bandaid or gauze and secure with tape. Change as needed until healed for skin tears, abrasions, or minor irritations.    Historical Provider, MD  pantoprazole (PROTONIX) 40 MG tablet Take 1 tablet (40 mg total) by mouth 2 (two) times daily. 12/07/14   Alison Murray, MD  potassium chloride 20 MEQ/15ML (10%) SOLN Take 10 mEq by mouth daily.     Historical Provider, MD  risperiDONE (RISPERDAL) 0.25 MG tablet Take 1 tablet (0.25 mg total) by mouth daily. 12/07/14   Alison Murray, MD  risperiDONE (RISPERDAL) 0.5 MG tablet Take 1 tablet (0.5 mg total) by mouth at bedtime. 12/07/14   Alison Murray, MD  Vitamin D, Ergocalciferol, (DRISDOL) 50000 UNITS CAPS capsule Take 50,000 Units by mouth every Thursday.    Historical Provider, MD    Allergies  Review of patient's allergies indicates no known allergies.  Triage Vitals: BP 169/69 mmHg  Pulse 67  Temp(Src) 98.1 F (36.7 C) (Oral)  Resp 18  SpO2 100%  Physical Exam  Constitutional: She appears well-developed and well-nourished.  HENT:  Head: Normocephalic and atraumatic.  Mouth/Throat: Oropharynx is clear and moist.  Moist mucous membranes No exudate No battle sign No racoon eyes No hemotympanum bilaterally  Eyes: Pupils are equal, round, and reactive to light.  Neck: Normal range of motion.  Trachea midline  Cardiovascular: Normal rate and regular rhythm.   Capillary refill less than 2 seconds to all digits of the bilateral hands  Pulmonary/Chest: Effort normal and breath sounds normal. No respiratory distress. She has no wheezes. She has no rales.  CTAB  Abdominal: Soft. Bowel sounds are normal. She exhibits no mass. There is no tenderness. There is no rebound and no guarding.  Musculoskeletal: Normal range of motion.  No step offs, crepitance, or apparent point tenderness of the cervical spine No foreshortening or external rotation Pelvis stable  Neurological: She is alert. She has normal reflexes.  Patellar and  achilles intact bilaterally  Skin: Skin is warm and dry.  Psychiatric: She has a normal mood and affect. Her behavior is normal.  Nursing note and vitals reviewed.   ED Course  Procedures   DIAGNOSTIC STUDIES: Oxygen Saturation is 100% on RA, normal by my interpretation.    COORDINATION OF CARE: 3:11 AM Discussed treatment plan which includes CT scans of the head and cervical spine and bilateral hip XR with pt at bedside.   Labs Reviewed - No data to display  Imaging Review Ct Head Wo Contrast  02/26/2015   CLINICAL DATA:  Status post fall to bedside pad. Concern for head or cervical spine injury. Initial encounter.  EXAM: CT HEAD WITHOUT CONTRAST  CT CERVICAL SPINE WITHOUT CONTRAST  TECHNIQUE: Multidetector CT imaging of the head and cervical spine was performed following the standard protocol without intravenous contrast. Multiplanar  CT image reconstructions of the cervical spine were also generated.  COMPARISON:  CT of the head and cervical spine performed 11/19/2014  FINDINGS: CT HEAD FINDINGS  There is no evidence of acute infarction, mass lesion, or intra- or extra-axial hemorrhage on CT.  Prominence of the ventricles and sulci reflects moderate cortical volume loss. Cerebellar atrophy is noted. Diffuse periventricular and subcortical white matter change likely reflects small vessel ischemic microangiopathy.  The brainstem and fourth ventricle are within normal limits. The basal ganglia are unremarkable in appearance. The cerebral hemispheres demonstrate grossly normal gray-white differentiation. No mass effect or midline shift is seen.  There is no evidence of fracture; visualized osseous structures are unremarkable in appearance. The visualized portions of the orbits are within normal limits. There is opacification of the right mastoid air cells. The paranasal sinuses and left mastoid air cells are well-aerated. No significant soft tissue abnormalities are seen.  CT CERVICAL SPINE FINDINGS   There is no evidence of acute fracture or subluxation. There is grade 1 retrolisthesis of C3 on C4, and grade 1 anterolisthesis of C5 on C6 and of C6 on C7. Mild underlying facet disease is noted. There is disc space narrowing at C3-C4 and C6-C7. Vertebral bodies demonstrate normal height. Prevertebral soft tissues are within normal limits.  The visualized portions of the thyroid gland are unremarkable in appearance. Scarring is noted at the lung apices. Minimal calcification is noted at the carotid bifurcations bilaterally.  IMPRESSION: 1. No evidence of traumatic intracranial injury or fracture. 2. No evidence of acute fracture or subluxation along the cervical spine. 3. Moderate cortical volume loss and diffuse small vessel ischemic microangiopathy. 4. Mild diffuse degenerative change along the cervical spine. 5. Opacification of the right mastoid air cells. 6. Scarring noted at the lung apices. 7. Minimal calcification at the carotid bifurcations bilaterally.   Electronically Signed   By: Roanna Raider M.D.   On: 02/26/2015 03:14   Ct Cervical Spine Wo Contrast  02/26/2015   CLINICAL DATA:  Status post fall to bedside pad. Concern for head or cervical spine injury. Initial encounter.  EXAM: CT HEAD WITHOUT CONTRAST  CT CERVICAL SPINE WITHOUT CONTRAST  TECHNIQUE: Multidetector CT imaging of the head and cervical spine was performed following the standard protocol without intravenous contrast. Multiplanar CT image reconstructions of the cervical spine were also generated.  COMPARISON:  CT of the head and cervical spine performed 11/19/2014  FINDINGS: CT HEAD FINDINGS  There is no evidence of acute infarction, mass lesion, or intra- or extra-axial hemorrhage on CT.  Prominence of the ventricles and sulci reflects moderate cortical volume loss. Cerebellar atrophy is noted. Diffuse periventricular and subcortical white matter change likely reflects small vessel ischemic microangiopathy.  The brainstem and fourth  ventricle are within normal limits. The basal ganglia are unremarkable in appearance. The cerebral hemispheres demonstrate grossly normal gray-white differentiation. No mass effect or midline shift is seen.  There is no evidence of fracture; visualized osseous structures are unremarkable in appearance. The visualized portions of the orbits are within normal limits. There is opacification of the right mastoid air cells. The paranasal sinuses and left mastoid air cells are well-aerated. No significant soft tissue abnormalities are seen.  CT CERVICAL SPINE FINDINGS  There is no evidence of acute fracture or subluxation. There is grade 1 retrolisthesis of C3 on C4, and grade 1 anterolisthesis of C5 on C6 and of C6 on C7. Mild underlying facet disease is noted. There is disc space narrowing at C3-C4  and C6-C7. Vertebral bodies demonstrate normal height. Prevertebral soft tissues are within normal limits.  The visualized portions of the thyroid gland are unremarkable in appearance. Scarring is noted at the lung apices. Minimal calcification is noted at the carotid bifurcations bilaterally.  IMPRESSION: 1. No evidence of traumatic intracranial injury or fracture. 2. No evidence of acute fracture or subluxation along the cervical spine. 3. Moderate cortical volume loss and diffuse small vessel ischemic microangiopathy. 4. Mild diffuse degenerative change along the cervical spine. 5. Opacification of the right mastoid air cells. 6. Scarring noted at the lung apices. 7. Minimal calcification at the carotid bifurcations bilaterally.   Electronically Signed   By: Roanna Raider M.D.   On: 02/26/2015 03:14   Dg Hips Bilat With Pelvis 2v  02/26/2015   CLINICAL DATA:  Larey Seat about 1 foot out of bed onto a foam pad. No obvious injuries.  EXAM: DG HIP (WITH OR WITHOUT PELVIS) 2V BILAT  COMPARISON:  None.  FINDINGS: There is no evidence of hip fracture or dislocation. There is no evidence of arthropathy or other focal bone  abnormality.  IMPRESSION: Negative.   Electronically Signed   By: Ellery Plunk M.D.   On: 02/26/2015 03:00    I personally reviewed and evaluated these images as a part of my medical decision-making.   EKG Interpretation  Date/Time:    Ventricular Rate:    PR Interval:    QRS Duration:   QT Interval:    QTC Calculation:   R Axis:     Text Interpretation:      MDM   Final diagnoses:  None   Fall from bed.  Xrays normal.  Stable for discharge   I, Alina Gilkey-RASCH,Nahshon Reich K, personally performed the services described in this documentation. All medical record entries made by the scribe were at my direction and in my presence.  I have reviewed the chart and discharge instructions and agree that the record reflects my personal performance and is accurate and complete. Aaminah Forrester-RASCH,Robbin Escher K.  02/28/2015. 12:15 AM.     Jaelene Garciagarcia, MD 02/28/15 1610

## 2015-02-26 NOTE — ED Notes (Signed)
Bed: WA21 Expected date:  Expected time:  Means of arrival:  Comments: EMS fall from bed

## 2015-02-26 NOTE — ED Notes (Signed)
Pt to xray

## 2015-02-26 NOTE — ED Notes (Signed)
Attempt made to call report to [facility]

## 2015-02-28 ENCOUNTER — Encounter (HOSPITAL_COMMUNITY): Payer: Self-pay | Admitting: Emergency Medicine

## 2015-04-19 ENCOUNTER — Emergency Department (HOSPITAL_COMMUNITY): Admit: 2015-04-19 | Discharge: 2015-04-19 | Disposition: A | Discharge status: Home / Self Care | Payer: Medicare Other

## 2015-04-19 ENCOUNTER — Inpatient Hospital Stay (HOSPITAL_COMMUNITY)
Admission: EM | Admit: 2015-04-19 | Discharge: 2015-04-26 | DRG: 300 | Disposition: A | Discharge status: Skilled Nursing Facility | Payer: Medicare Other | Attending: Internal Medicine | Admitting: Internal Medicine

## 2015-04-19 ENCOUNTER — Encounter (HOSPITAL_COMMUNITY): Payer: Self-pay

## 2015-04-19 DIAGNOSIS — G309 Alzheimer's disease, unspecified: Secondary | ICD-10-CM | POA: Diagnosis present

## 2015-04-19 DIAGNOSIS — F419 Anxiety disorder, unspecified: Secondary | ICD-10-CM | POA: Diagnosis present

## 2015-04-19 DIAGNOSIS — Z515 Encounter for palliative care: Secondary | ICD-10-CM | POA: Diagnosis present

## 2015-04-19 DIAGNOSIS — J438 Other emphysema: Secondary | ICD-10-CM | POA: Diagnosis not present

## 2015-04-19 DIAGNOSIS — I82402 Acute embolism and thrombosis of unspecified deep veins of left lower extremity: Principal | ICD-10-CM | POA: Diagnosis present

## 2015-04-19 DIAGNOSIS — I129 Hypertensive chronic kidney disease with stage 1 through stage 4 chronic kidney disease, or unspecified chronic kidney disease: Secondary | ICD-10-CM | POA: Diagnosis present

## 2015-04-19 DIAGNOSIS — E440 Moderate protein-calorie malnutrition: Secondary | ICD-10-CM | POA: Diagnosis present

## 2015-04-19 DIAGNOSIS — R410 Disorientation, unspecified: Secondary | ICD-10-CM | POA: Diagnosis present

## 2015-04-19 DIAGNOSIS — I10 Essential (primary) hypertension: Secondary | ICD-10-CM | POA: Diagnosis present

## 2015-04-19 DIAGNOSIS — J449 Chronic obstructive pulmonary disease, unspecified: Secondary | ICD-10-CM | POA: Diagnosis present

## 2015-04-19 DIAGNOSIS — K219 Gastro-esophageal reflux disease without esophagitis: Secondary | ICD-10-CM | POA: Diagnosis present

## 2015-04-19 DIAGNOSIS — F329 Major depressive disorder, single episode, unspecified: Secondary | ICD-10-CM | POA: Diagnosis present

## 2015-04-19 DIAGNOSIS — Z79899 Other long term (current) drug therapy: Secondary | ICD-10-CM | POA: Diagnosis not present

## 2015-04-19 DIAGNOSIS — N183 Chronic kidney disease, stage 3 (moderate): Secondary | ICD-10-CM | POA: Diagnosis present

## 2015-04-19 DIAGNOSIS — Z66 Do not resuscitate: Secondary | ICD-10-CM | POA: Diagnosis present

## 2015-04-19 DIAGNOSIS — Z681 Body mass index (BMI) 19 or less, adult: Secondary | ICD-10-CM

## 2015-04-19 DIAGNOSIS — F0280 Dementia in other diseases classified elsewhere without behavioral disturbance: Secondary | ICD-10-CM | POA: Diagnosis not present

## 2015-04-19 LAB — COMPREHENSIVE METABOLIC PANEL
ALBUMIN: 2.8 g/dL — AB (ref 3.5–5.0)
ALK PHOS: 61 U/L (ref 38–126)
ALT: 10 U/L — AB (ref 14–54)
AST: 17 U/L (ref 15–41)
Anion gap: 5 (ref 5–15)
BUN: 20 mg/dL (ref 6–20)
CALCIUM: 8.6 mg/dL — AB (ref 8.9–10.3)
CHLORIDE: 108 mmol/L (ref 101–111)
CO2: 27 mmol/L (ref 22–32)
CREATININE: 1.32 mg/dL — AB (ref 0.44–1.00)
GFR calc non Af Amer: 33 mL/min — ABNORMAL LOW (ref >60)
GFR, EST AFRICAN AMERICAN: 38 mL/min — AB (ref >60)
GLUCOSE: 100 mg/dL — AB (ref 65–99)
Potassium: 4.5 mmol/L (ref 3.5–5.1)
SODIUM: 140 mmol/L (ref 135–145)
Total Bilirubin: 0.7 mg/dL (ref 0.3–1.2)
Total Protein: 5.6 g/dL — ABNORMAL LOW (ref 6.5–8.1)

## 2015-04-19 LAB — CBC WITH DIFFERENTIAL/PLATELET
Basophils Absolute: 0 10*3/uL (ref 0.0–0.1)
Basophils Relative: 0 %
Eosinophils Absolute: 0.1 10*3/uL (ref 0.0–0.7)
Eosinophils Relative: 1 %
HEMATOCRIT: 31.6 % — AB (ref 36.0–46.0)
HEMOGLOBIN: 10 g/dL — AB (ref 12.0–15.0)
LYMPHS ABS: 1.3 10*3/uL (ref 0.7–4.0)
Lymphocytes Relative: 13 %
MCH: 27.2 pg (ref 26.0–34.0)
MCHC: 31.6 g/dL (ref 30.0–36.0)
MCV: 85.9 fL (ref 78.0–100.0)
MONOS PCT: 9 %
Monocytes Absolute: 0.8 10*3/uL (ref 0.1–1.0)
NEUTROS ABS: 7.6 10*3/uL (ref 1.7–7.7)
NEUTROS PCT: 77 %
Platelets: 275 10*3/uL (ref 150–400)
RBC: 3.68 MIL/uL — ABNORMAL LOW (ref 3.87–5.11)
RDW: 14.6 % (ref 11.5–15.5)
WBC: 9.9 10*3/uL (ref 4.0–10.5)

## 2015-04-19 LAB — PROTIME-INR
INR: 1.16 (ref 0.00–1.49)
Prothrombin Time: 15 seconds (ref 11.6–15.2)

## 2015-04-19 LAB — MAGNESIUM: Magnesium: 2.2 mg/dL (ref 1.7–2.4)

## 2015-04-19 LAB — PHOSPHORUS: Phosphorus: 3.8 mg/dL (ref 2.5–4.6)

## 2015-04-19 LAB — APTT: aPTT: 32 seconds (ref 24–37)

## 2015-04-19 MED ORDER — WARFARIN - PHARMACIST DOSING INPATIENT: Freq: Every day | Status: DC

## 2015-04-19 MED ORDER — ENOXAPARIN SODIUM 60 MG/0.6ML ~~LOC~~ SOLN
50.0000 mg | Freq: Once | SUBCUTANEOUS | Status: AC
  Administered 2015-04-19: 50 mg via SUBCUTANEOUS
  Filled 2015-04-19: qty 0.6

## 2015-04-19 MED ORDER — ENOXAPARIN SODIUM 60 MG/0.6ML ~~LOC~~ SOLN
50.0000 mg | SUBCUTANEOUS | Status: DC
  Administered 2015-04-20 – 2015-04-25 (×6): 50 mg via SUBCUTANEOUS
  Filled 2015-04-19 (×8): qty 0.6

## 2015-04-19 MED ORDER — WARFARIN SODIUM 2.5 MG PO TABS
2.5000 mg | ORAL_TABLET | Freq: Once | ORAL | Status: AC
  Administered 2015-04-20: 2.5 mg via ORAL
  Filled 2015-04-19 (×2): qty 1

## 2015-04-19 MED ORDER — WARFARIN SODIUM 5 MG PO TABS
5.0000 mg | ORAL_TABLET | Freq: Once | ORAL | Status: DC
  Filled 2015-04-19: qty 1

## 2015-04-19 NOTE — ED Notes (Signed)
[facility] her left leg. Pt was diagnosed with the DVT at the facility today. Symptoms of redness, swelling, and heat started 2 days ago. Pt has a hx of dementia, very confused at baseline.

## 2015-04-19 NOTE — ED Provider Notes (Signed)
Medical screening examination/treatment/procedure(s) were conducted as a shared visit with non-physician practitioner(s) and myself.  I personally evaluated the patient during the encounter.  Cephalexin assisted-living facility secondary to new-onset left DVT. Patient is nonverbal secondary to dementia on my exam patient has a erythematous, edematous and warm left lower extremity. Not able to get the ultrasound this time. Initially plan on sending her home with Lovenox & Coumadin however the facility cannot do any type of injections to patient will be admitted to the hospitalist service for further management and bridging.   EKG Interpretation None        Marily MemosJason Zenda Herskowitz, MD 04/19/15 2248

## 2015-04-19 NOTE — Progress Notes (Signed)
Patient ID: [name], female   DOB: 02/21/1917, 81 y.o.   MRN: [id number]  Addendum:  Carlene CoriaSerena Y Sam, PA-C discussed the findings with the patient's son earlier who agreed with starting long-term anticoagulation treatment. [facility] is unable to do parenteral Lovenox, so the patient will be started on Lovenox and bridged to warfarin while in the hospital.  Lab results:     Component Value Units   Magnesium [[id number]][[id number]] Collected: 04/19/15 2113   Updated: 04/19/15 2200    Specimen Type: Blood     Magnesium 2.2 mg/dL   Phosphorus [[id number]][[id number]] Collected: 04/19/15 2113   Updated: 04/19/15 2200    Specimen Type: Blood    Specimen Source: Vein     Phosphorus 3.8 mg/dL   Comprehensive metabolic panel [[id number]][[id number]] (Abnormal) Collected: 04/19/15 2113   Updated: 04/19/15 2200    Specimen Type: Blood    Specimen Source: Vein     Sodium 140 mmol/L    Potassium 4.5 mmol/L    Chloride 108 mmol/L    CO2 27 mmol/L    Glucose, Bld 100 (H) mg/dL    BUN 20 mg/dL    Creatinine, Ser 6.291.32 (H) mg/dL    Calcium 8.6 (L) mg/dL    Total Protein 5.6 (L) g/dL    Albumin 2.8 (L) g/dL    AST 17 U/L    ALT 10 (L) U/L    Alkaline Phosphatase 61 U/L    Total Bilirubin 0.7 mg/dL    GFR calc non Af Amer 33 (L) mL/min    GFR calc Af Amer 38 (L) mL/min    Anion gap 5   Protime-INR [[id number]][[id number]] Collected: 04/19/15 2113   Updated: 04/19/15 2153    Specimen Type: Blood     Prothrombin Time 15.0 seconds    INR 1.16   APTT [[id number]][[id number]] Collected: 04/19/15 2113   Updated: 04/19/15 2153    Specimen Type: Blood     aPTT 32 seconds   CBC with Differential/Platelet [[id number]][[id number]] (Abnormal) Collected: 04/19/15 2113   Updated: 04/19/15 2129    Specimen Type: Blood     WBC 9.9 K/uL    RBC 3.68 (L) MIL/uL    Hemoglobin 10.0 (L) g/dL    HCT 59.531.6 (L) %    MCV 85.9 fL    MCH 27.2 pg    MCHC 31.6 g/dL    RDW 63.814.6 %    Platelets 275 K/uL    Neutrophils Relative  % 77 %    Neutro Abs 7.6 K/uL    Lymphocytes Relative 13 %    Lymphs Abs 1.3 K/uL    Monocytes Relative 9 %    Monocytes Absolute 0.8 K/uL    Eosinophils Relative 1 %    Eosinophils Absolute 0.1 K/uL    Basophils Relative 0 %    Basophils Absolute 0.0 K/uL

## 2015-04-19 NOTE — Progress Notes (Signed)
ANTICOAGULATION CONSULT NOTE - Initial Consult  Pharmacy Consult for warfarin Indication: DVT  No Known Allergies  Patient Measurements:   Heparin Dosing Weight:   Vital Signs: Temp: 98.8 F (37.1 C) (11/30 1758) Temp Source: Oral (11/30 1758) BP: 141/56 mmHg (11/30 2025) Pulse Rate: 62 (11/30 2025)  Labs: No results for input(s): HGB, HCT, PLT, APTT, LABPROT, INR, HEPARINUNFRC, CREATININE, CKTOTAL, CKMB, TROPONINI in the last 72 hours.  CrCl cannot be calculated (Unknown ideal weight.).   Medical History: Past Medical History  Diagnosis Date  . GERD (gastroesophageal reflux disease)   . Anxiety   . Dementia   . Alzheimer disease   . COPD (chronic obstructive pulmonary disease) (HCC)   . Hypertension   . Renal disorder     Assessment: [name] with advanced dementia presents from SNF with acute VTE diagnosed at facility. Original plan was to discharge back to facility but due to inability of facility to give injection, patient will be admitted for bridge therapy. Warfarin 5mg  PO x1 and enoxaparin 50mg  SQ x 1 ordered in ED. Pharmacy asked to dose warfarin for acute VTE  Enoxaparin 50mg  given at 21:16 11/30 in ED  Baseline INR = 1.16, aPTT = 32sec  Labs, 04/19/2015  CBC: Hgb = 10, pltc WNL  Renal:   Drug - drug interactions: bactrim  Goal of Therapy:  INR 2-3 Monitor platelets by anticoagulation protocol: Yes   Plan:  Day 1 of minimum 5-day overlap for acute VTE  Warfarin 2.5mg  PO x 1 tonight - reduce dose for concomitant bactrim (dose given 11/30 am) and advanced age  Discussed with Dr. Robb Matarrtiz, will d/c bactrim (? [facility] which erythema ended up being DVT)  Change enoxaparin to 50mg  SQ q24h  Check CBC at minimum q72h while in hospital - currently ordered daily x 4  Follow-up weight  Juliette Alcideustin Zeigler, PharmD, BCPS.   Pager: [phone] 04/19/2015 9:13 PM

## 2015-04-19 NOTE — H&P (Signed)
[facility] History and Physical  [name] ZOX:[id number] DOB: [birth year] DOA: 04/19/2015  Referring physician: Carlene Coria, PA-C PCP: Sherri Parker, MD   Chief Complaint: DVT  HPI: [name] is a 81 y.o. female with a past medical history of Alzheimer's dementia, hypertension, COPD, anxiety, depression, GERD who was sent to the emergency department from [facility], the assisted living facility where she resides, after being diagnosed with a left lower extremity DVT by venous duplex today. The patient was sent to get this study after she developed erythema, edema and calor for the past 2 days. The patient is currently sleepy, arousable, but confused, nonverbal and unable to contribute further information.   When seen in the emergency department, she was in no acute distress.  Review of Systems:  Unable to review due to dementia.  Past Medical History  Diagnosis Date  . GERD (gastroesophageal reflux disease)   . Anxiety   . Dementia   . Alzheimer disease   . COPD (chronic obstructive pulmonary disease) (HCC)   . Hypertension   . Renal disorder    History reviewed. No pertinent past surgical history. Social History:  reports that she has never smoked. She does not have any smokeless tobacco history on file. She reports that she does not drink alcohol or use illicit drugs.  No Known Allergies  No family history on file.  Prior to Admission medications   Medication Sig Start Date End Date Taking? Authorizing Provider  ALPRAZolam (XANAX) 0.25 MG tablet Take 1 tablet (0.25 mg total) by mouth every 6 (six) hours as needed for anxiety (agitation). Not to exceed 6 tablets in 24 hours Patient taking differently: Take 0.25 mg by mouth daily. Not to exceed 6 tablets in 24 hours 12/07/14  Yes Sherri Murray, MD  azelastine (OPTIVAR) 0.05 % ophthalmic solution Place 1 drop into both eyes 2 (two) times daily.   Yes Historical Provider, MD  citalopram (CELEXA) 10 MG  tablet Take 10 mg by mouth daily.   Yes Historical Provider, MD  donepezil (ARICEPT) 10 MG tablet Take 10 mg by mouth at bedtime.   Yes Historical Provider, MD  ENSURE (ENSURE) Take 237 mLs by mouth 3 (three) times daily with meals.   Yes Historical Provider, MD  esomeprazole (NEXIUM) 20 MG capsule Take 20 mg by mouth daily at 12 noon.   Yes Historical Provider, MD  hydrALAZINE (APRESOLINE) 10 MG tablet Take 1 tablet (10 mg total) by mouth 2 (two) times daily. 12/07/14  Yes Sherri Murray, MD  hydrocerin (EUCERIN) CREA Apply 1 application topically 2 (two) times daily.   Yes Historical Provider, MD  mirtazapine (REMERON) 30 MG tablet Take 30 mg by mouth at bedtime.   Yes Historical Provider, MD  potassium chloride 20 MEQ/15ML (10%) SOLN Take 10 mEq by mouth daily.    Yes Historical Provider, MD  risperiDONE (RISPERDAL) 0.25 MG tablet Take 1 tablet (0.25 mg total) by mouth daily. 12/07/14  Yes Sherri Murray, MD  risperiDONE (RISPERDAL) 0.5 MG tablet Take 1 tablet (0.5 mg total) by mouth at bedtime. 12/07/14  Yes Sherri Murray, MD  sulfamethoxazole-trimethoprim (BACTRIM DS,SEPTRA DS) 800-160 MG tablet Take 1 tablet by mouth 2 (two) times daily. ABT Start Date 04/19/15 and End Date 04/25/15.   Yes Historical Provider, MD  acetaminophen (TYLENOL) 500 MG tablet Take 500 mg by mouth every 6 (six) hours as needed for mild pain.    Historical Provider, MD  alum & mag hydroxide-simeth (MAALOX/MYLANTA) 200-200-20  MG/5ML suspension Take 30 mLs by mouth every 6 (six) hours as needed for indigestion or heartburn.    Historical Provider, MD  bumetanide (BUMEX) 1 MG tablet Take 0.5 mg by mouth 2 (two) times a week. Monday and Thursday.    Historical Provider, MD  guaifenesin (ROBITUSSIN) 100 MG/5ML syrup Take 200 mg by mouth every 6 (six) hours as needed for cough.     Historical Provider, MD  loperamide (IMODIUM) 2 MG capsule Take 2 mg by mouth as needed for diarrhea or loose stools (not to exceed 8 doses in 24 hours).     Historical Provider, MD  magnesium hydroxide (MILK OF MAGNESIA) 400 MG/5ML suspension Take 30 mLs by mouth daily as needed for mild constipation.    Historical Provider, MD  Neomycin-Bacitracin-Polymyxin (TRIPLE ANTIBIOTIC EX) Apply 1 application topically. Apply after cleaning with normal saline, cover with bandaid or gauze and secure with tape. Change as needed until healed for skin tears, abrasions, or minor irritations.    Historical Provider, MD  pantoprazole (PROTONIX) 40 MG tablet Take 1 tablet (40 mg total) by mouth 2 (two) times daily. Patient not taking: Reported on 04/19/2015 12/07/14   Sherri MurrayAlma M Devine, MD  Vitamin D, Ergocalciferol, (DRISDOL) 50000 UNITS CAPS capsule Take 50,000 Units by mouth every Thursday.    Historical Provider, MD   Physical Exam: Filed Vitals:   04/19/15 1758 04/19/15 2025  BP: 135/96 141/56  Pulse: 86 62  Temp: 98.8 F (37.1 C)   TempSrc: Oral   Resp: 18 18  SpO2: 100% 97%    Wt Readings from Last 3 Encounters:  12/06/14 51.7 kg (113 lb 15.7 oz)  06/08/14 45.36 kg (100 lb)  06/07/14 45.36 kg (100 lb)    General:  Appears calm and comfortable Eyes: PERRL, normal lids, irises & conjunctiva ENT: grossly normal hearing, lips and oral mucosa are moist. Neck: no LAD, masses or thyromegaly Cardiovascular: RRR, no m/r/g. 2+ LLE edema. Telemetry: Not currently on the monitor. Respiratory: CTA bilaterally, no w/r/r. Normal respiratory effort. Abdomen: soft, ntnd Skin: no rash or induration seen on limited exam Musculoskeletal: grossly normal tone BUE/BLE Psychiatric: Sleepy, arousable, confused,  nonverbal. Neurologic:  Unable to evaluate due to dementia      Labs have been ordered and are pending.  Assessment/Plan Principal problem:    DVT, lower extremity, left Lovenox 1 mg per KG every 12 hours subcutaneously. Warfarin per pharmacy dosing. Monitor APTT, PT/INR.   Active Problems:   Alzheimer's dementia Continue current  medications. Supportive care.    COPD (chronic obstructive pulmonary disease) (HCC) Nebulized beta agonists as needed.    HTN (hypertension) Continue Bumex and hydralazine. Monitor blood pressure and adjust treatment as needed.     Code Status: Full code. DVT Prophylaxis: On full dose anticoagulation. Family Communication: Disposition Plan: Admit to start Lovenox bridging and oral anticoagulant.  Time spent: Over 70 minutes were spent during the process of this admission.  Bobette Moavid Manuel Ginna Schuur [facility] Pager [phone].

## 2015-04-19 NOTE — ED Provider Notes (Signed)
CSN: [id number]     Arrival date & time 04/19/15  1745 History   First MD Initiated Contact with Patient 04/19/15 1748     Chief Complaint  Patient presents with  . DVT    HPI  Ms. Zachery DauerBarnes is an 81 y.o. female with history of dementia, alzheimer's, HTN, COPD, GERD who presents to the ED via EMS [facility] for evaluation of DVT. Per EMS report, pt was diagnosed with DVT at her facility today. Facility staff reportedly noticed redness and warmth of pt's left calf for the past 2 days. I do not have access to the [facility]. Pt has history of severe dementia and is nonverbal. In the room her eyes are open and she will occasionally make eye contact with me but does not respond to any of my commands. She does not appear to be in distress.   Past Medical History  Diagnosis Date  . GERD (gastroesophageal reflux disease)   . Anxiety   . Dementia   . Alzheimer disease   . COPD (chronic obstructive pulmonary disease) (HCC)   . Hypertension   . Renal disorder    History reviewed. No pertinent past surgical history. No family history on file. Social History  Substance Use Topics  . Smoking status: Never Smoker   . Smokeless tobacco: None  . Alcohol Use: No   OB History    No data available     Review of Systems  Unable to perform ROS: Dementia      Allergies  Review of patient's allergies indicates no known allergies.  Home Medications   Prior to Admission medications   Medication Sig Start Date End Date Taking? Authorizing Provider  ALPRAZolam (XANAX) 0.25 MG tablet Take 1 tablet (0.25 mg total) by mouth every 6 (six) hours as needed for anxiety (agitation). Not to exceed 6 tablets in 24 hours Patient taking differently: Take 0.25 mg by mouth daily. Not to exceed 6 tablets in 24 hours 12/07/14  Yes Alison MurrayAlma M Devine, MD  azelastine (OPTIVAR) 0.05 % ophthalmic solution Place 1 drop into both eyes 2 (two) times daily.   Yes Historical Provider, MD   citalopram (CELEXA) 10 MG tablet Take 10 mg by mouth daily.   Yes Historical Provider, MD  donepezil (ARICEPT) 10 MG tablet Take 10 mg by mouth at bedtime.   Yes Historical Provider, MD  ENSURE (ENSURE) Take 237 mLs by mouth 3 (three) times daily with meals.   Yes Historical Provider, MD  esomeprazole (NEXIUM) 20 MG capsule Take 20 mg by mouth daily at 12 noon.   Yes Historical Provider, MD  hydrALAZINE (APRESOLINE) 10 MG tablet Take 1 tablet (10 mg total) by mouth 2 (two) times daily. 12/07/14  Yes Alison MurrayAlma M Devine, MD  hydrocerin (EUCERIN) CREA Apply 1 application topically 2 (two) times daily.   Yes Historical Provider, MD  mirtazapine (REMERON) 30 MG tablet Take 30 mg by mouth at bedtime.   Yes Historical Provider, MD  potassium chloride 20 MEQ/15ML (10%) SOLN Take 10 mEq by mouth daily.    Yes Historical Provider, MD  risperiDONE (RISPERDAL) 0.25 MG tablet Take 1 tablet (0.25 mg total) by mouth daily. 12/07/14  Yes Alison MurrayAlma M Devine, MD  risperiDONE (RISPERDAL) 0.5 MG tablet Take 1 tablet (0.5 mg total) by mouth at bedtime. 12/07/14  Yes Alison MurrayAlma M Devine, MD  sulfamethoxazole-trimethoprim (BACTRIM DS,SEPTRA DS) 800-160 MG tablet Take 1 tablet by mouth 2 (two) times daily. ABT Start Date 04/19/15  and End Date 04/25/15.   Yes Historical Provider, MD  acetaminophen (TYLENOL) 500 MG tablet Take 500 mg by mouth every 6 (six) hours as needed for mild pain.    Historical Provider, MD  alum & mag hydroxide-simeth (MAALOX/MYLANTA) 200-200-20 MG/5ML suspension Take 30 mLs by mouth every 6 (six) hours as needed for indigestion or heartburn.    Historical Provider, MD  bumetanide (BUMEX) 1 MG tablet Take 0.5 mg by mouth 2 (two) times a week. Monday and Thursday.    Historical Provider, MD  guaifenesin (ROBITUSSIN) 100 MG/5ML syrup Take 200 mg by mouth every 6 (six) hours as needed for cough.     Historical Provider, MD  loperamide (IMODIUM) 2 MG capsule Take 2 mg by mouth as needed for diarrhea or loose stools (not to  exceed 8 doses in 24 hours).    Historical Provider, MD  magnesium hydroxide (MILK OF MAGNESIA) 400 MG/5ML suspension Take 30 mLs by mouth daily as needed for mild constipation.    Historical Provider, MD  Neomycin-Bacitracin-Polymyxin (TRIPLE ANTIBIOTIC EX) Apply 1 application topically. Apply after cleaning with normal saline, cover with bandaid or gauze and secure with tape. Change as needed until healed for skin tears, abrasions, or minor irritations.    Historical Provider, MD  pantoprazole (PROTONIX) 40 MG tablet Take 1 tablet (40 mg total) by mouth 2 (two) times daily. Patient not taking: Reported on 04/19/2015 12/07/14   Alison Murray, MD  Vitamin D, Ergocalciferol, (DRISDOL) 50000 UNITS CAPS capsule Take 50,000 Units by mouth every Thursday.    Historical Provider, MD   BP 135/96 mmHg  Pulse 86  Temp(Src) 98.8 F (37.1 C) (Oral)  Resp 18  SpO2 100% Physical Exam  Constitutional:  Pt is laying in bed with eyes open, occasionally making eye contact with me. Does not respond to commands. Does not appear to be in distress.  HENT:  Head: Atraumatic.  Right Ear: External ear normal.  Left Ear: External ear normal.  Nose: Nose normal.  Eyes: Conjunctivae are normal. Pupils are equal, round, and reactive to light. No scleral icterus.  Neck: Neck supple. No tracheal deviation present.  Cardiovascular: Normal rate, regular rhythm and normal heart sounds.   Pulmonary/Chest: Effort normal and breath sounds normal. No stridor. No respiratory distress.  Abdominal: Soft. Bowel sounds are normal. She exhibits no distension.  Musculoskeletal:  L calf is mildly swollen. It is red, warm to the touch.   Lymphadenopathy:    She has no cervical adenopathy.  Skin: Skin is warm and dry.    ED Course  Procedures (including critical care time) Labs Review Labs Reviewed - No data to display  Imaging Review No results found. I have personally reviewed and evaluated these images and lab results as  part of my medical decision-making.   EKG Interpretation None      MDM   Final diagnoses:  DVT (deep venous thrombosis), left    [place] here does show evidence of DVT. I spoke to pt's son, he would like pt to be started on anti-coagulation. Discussed risks and benefits and pt's son verbalized understanding. Will start on  warfarin and 50 lovenox here. I spoke to [facility] and they will accept pt back as long as she is on PO anticoag only.   Spoke to pharmacist here, ideally pt would be started on PO warfarin and SQ lovenox today with 5 day lovenox bridge. Start with  warfarin and  lovenox today, then 2.5mg  warfarin and  lovenox  daily. However since assisted living facility is unable to do any kind of injection we will have to admit pt here for anticoagulation. Spoke to Dr. Robb Matar, will admit to medicine. Spoke to pt's son to inform him of plan. He is in agreement.   Carlene Coria, PA-C 04/19/15 2110  Marily Memos, MD 04/19/15 [phone]

## 2015-04-19 NOTE — Progress Notes (Signed)
Preliminary results by tech - Left Lower Ext. Venous Duplex Completed. Positive for acute deep vein thrombosis involving the common femoral vein, femoral vein, and popliteal vein. The patient was not cooperative, combative and the calf veins were difficult to image. Results given to patient's nurse. Marilynne Halstedita Abem Shaddix, BS, RDMS, RVT

## 2015-04-19 NOTE — ED Notes (Signed)
Bed: AV40WA05 Expected date:  Expected time:  Means of arrival:  Comments: DVT

## 2015-04-20 ENCOUNTER — Encounter (HOSPITAL_COMMUNITY): Payer: Self-pay | Admitting: *Deleted

## 2015-04-20 DIAGNOSIS — J438 Other emphysema: Secondary | ICD-10-CM

## 2015-04-20 LAB — CBC WITH DIFFERENTIAL/PLATELET
BASOS ABS: 0.1 10*3/uL (ref 0.0–0.1)
Basophils Relative: 1 %
EOS PCT: 3 %
Eosinophils Absolute: 0.2 10*3/uL (ref 0.0–0.7)
HEMATOCRIT: 30.1 % — AB (ref 36.0–46.0)
Hemoglobin: 9.6 g/dL — ABNORMAL LOW (ref 12.0–15.0)
LYMPHS ABS: 1.2 10*3/uL (ref 0.7–4.0)
LYMPHS PCT: 19 %
MCH: 28 pg (ref 26.0–34.0)
MCHC: 31.9 g/dL (ref 30.0–36.0)
MCV: 87.8 fL (ref 78.0–100.0)
MONOS PCT: 10 %
Monocytes Absolute: 0.6 10*3/uL (ref 0.1–1.0)
NEUTROS PCT: 67 %
Neutro Abs: 4.3 10*3/uL (ref 1.7–7.7)
PLATELETS: 293 10*3/uL (ref 150–400)
RBC: 3.43 MIL/uL — AB (ref 3.87–5.11)
RDW: 15.1 % (ref 11.5–15.5)
WBC: 6.4 10*3/uL (ref 4.0–10.5)

## 2015-04-20 LAB — COMPREHENSIVE METABOLIC PANEL
ALBUMIN: 2.5 g/dL — AB (ref 3.5–5.0)
ALT: 10 U/L — AB (ref 14–54)
AST: 14 U/L — AB (ref 15–41)
Alkaline Phosphatase: 60 U/L (ref 38–126)
Anion gap: 6 (ref 5–15)
BILIRUBIN TOTAL: 0.3 mg/dL (ref 0.3–1.2)
BUN: 18 mg/dL (ref 6–20)
CHLORIDE: 107 mmol/L (ref 101–111)
CO2: 27 mmol/L (ref 22–32)
CREATININE: 1.27 mg/dL — AB (ref 0.44–1.00)
Calcium: 8.5 mg/dL — ABNORMAL LOW (ref 8.9–10.3)
GFR calc Af Amer: 40 mL/min — ABNORMAL LOW (ref >60)
GFR, EST NON AFRICAN AMERICAN: 34 mL/min — AB (ref >60)
GLUCOSE: 81 mg/dL (ref 65–99)
POTASSIUM: 4.3 mmol/L (ref 3.5–5.1)
Sodium: 140 mmol/L (ref 135–145)
Total Protein: 5.4 g/dL — ABNORMAL LOW (ref 6.5–8.1)

## 2015-04-20 MED ORDER — KETOTIFEN FUMARATE 0.025 % OP SOLN
1.0000 [drp] | Freq: Two times a day (BID) | OPHTHALMIC | Status: DC
  Administered 2015-04-21 – 2015-04-26 (×9): 1 [drp] via OPHTHALMIC
  Filled 2015-04-20 (×3): qty 5

## 2015-04-20 MED ORDER — VITAMIN D (ERGOCALCIFEROL) 1.25 MG (50000 UNIT) PO CAPS
50000.0000 [IU] | ORAL_CAPSULE | ORAL | Status: DC
  Filled 2015-04-20 (×2): qty 1

## 2015-04-20 MED ORDER — ALPRAZOLAM 0.25 MG PO TABS
0.2500 mg | ORAL_TABLET | Freq: Four times a day (QID) | ORAL | Status: DC | PRN
  Administered 2015-04-20 – 2015-04-25 (×5): 0.25 mg via ORAL
  Filled 2015-04-20 (×6): qty 1

## 2015-04-20 MED ORDER — BUMETANIDE 0.5 MG PO TABS
0.5000 mg | ORAL_TABLET | ORAL | Status: DC
  Administered 2015-04-24: 0.5 mg via ORAL
  Filled 2015-04-20 (×4): qty 1

## 2015-04-20 MED ORDER — HYDROCERIN EX CREA
1.0000 "application " | TOPICAL_CREAM | Freq: Two times a day (BID) | CUTANEOUS | Status: DC
  Administered 2015-04-21 – 2015-04-26 (×8): 1 via TOPICAL
  Filled 2015-04-20 (×2): qty 113

## 2015-04-20 MED ORDER — GUAIFENESIN 100 MG/5ML PO SOLN: 200.0000 mg | Freq: Four times a day (QID) | ORAL | Status: DC | PRN

## 2015-04-20 MED ORDER — DONEPEZIL HCL 10 MG PO TABS
10.0000 mg | ORAL_TABLET | Freq: Every day | ORAL | Status: DC
  Administered 2015-04-20 – 2015-04-25 (×4): 10 mg via ORAL
  Filled 2015-04-20 (×9): qty 1

## 2015-04-20 MED ORDER — COUMADIN BOOK
Freq: Once | Status: AC
  Administered 2015-04-20: 1
  Filled 2015-04-20: qty 1

## 2015-04-20 MED ORDER — ENSURE ENLIVE PO LIQD
237.0000 mL | Freq: Two times a day (BID) | ORAL | Status: DC
  Administered 2015-04-20 – 2015-04-26 (×8): 237 mL via ORAL

## 2015-04-20 MED ORDER — CITALOPRAM HYDROBROMIDE 10 MG PO TABS
10.0000 mg | ORAL_TABLET | Freq: Every day | ORAL | Status: DC
  Administered 2015-04-20 – 2015-04-26 (×6): 10 mg via ORAL
  Filled 2015-04-20 (×8): qty 1

## 2015-04-20 MED ORDER — ALUM & MAG HYDROXIDE-SIMETH 200-200-20 MG/5ML PO SUSP: 30.0000 mL | Freq: Four times a day (QID) | ORAL | Status: DC | PRN

## 2015-04-20 MED ORDER — ACETAMINOPHEN 500 MG PO TABS: 500.0000 mg | ORAL_TABLET | Freq: Four times a day (QID) | ORAL | Status: DC | PRN

## 2015-04-20 MED ORDER — RISPERIDONE 0.5 MG PO TABS
0.5000 mg | ORAL_TABLET | Freq: Every day | ORAL | Status: DC
  Administered 2015-04-20 – 2015-04-25 (×4): 0.5 mg via ORAL
  Filled 2015-04-20 (×8): qty 1

## 2015-04-20 MED ORDER — WARFARIN VIDEO
Freq: Once | Status: AC
  Administered 2015-04-20: 1

## 2015-04-20 MED ORDER — PANTOPRAZOLE SODIUM 40 MG PO TBEC
40.0000 mg | DELAYED_RELEASE_TABLET | Freq: Every day | ORAL | Status: DC
  Administered 2015-04-21 – 2015-04-25 (×4): 40 mg via ORAL
  Filled 2015-04-20 (×8): qty 1

## 2015-04-20 MED ORDER — MIRTAZAPINE 30 MG PO TABS
30.0000 mg | ORAL_TABLET | Freq: Every day | ORAL | Status: DC
  Administered 2015-04-20 – 2015-04-25 (×4): 30 mg via ORAL
  Filled 2015-04-20 (×8): qty 1

## 2015-04-20 MED ORDER — HYDRALAZINE HCL 10 MG PO TABS
10.0000 mg | ORAL_TABLET | Freq: Two times a day (BID) | ORAL | Status: DC
  Administered 2015-04-20 – 2015-04-26 (×9): 10 mg via ORAL
  Filled 2015-04-20 (×17): qty 1

## 2015-04-20 MED ORDER — RISPERIDONE 0.25 MG PO TABS
0.2500 mg | ORAL_TABLET | Freq: Every day | ORAL | Status: DC
  Administered 2015-04-20 – 2015-04-25 (×5): 0.25 mg via ORAL
  Filled 2015-04-20 (×8): qty 1

## 2015-04-20 MED ORDER — LOPERAMIDE HCL 2 MG PO CAPS: 2.0000 mg | ORAL_CAPSULE | ORAL | Status: DC | PRN

## 2015-04-20 MED ORDER — ENSURE ENLIVE PO LIQD
237.0000 mL | Freq: Three times a day (TID) | ORAL | Status: DC
  Filled 2015-04-20 (×2): qty 237

## 2015-04-20 MED ORDER — POTASSIUM CHLORIDE 20 MEQ/15ML (10%) PO SOLN
10.0000 meq | Freq: Every day | ORAL | Status: DC
  Administered 2015-04-21 – 2015-04-26 (×4): 10 meq via ORAL
  Filled 2015-04-20 (×8): qty 7.5

## 2015-04-20 MED ORDER — MAGNESIUM HYDROXIDE 400 MG/5ML PO SUSP: 30.0000 mL | Freq: Every day | ORAL | Status: DC | PRN

## 2015-04-20 NOTE — Progress Notes (Addendum)
Patient ID: [name], female   DOB: 1916/09/07, 81 y.o.   MRN: [id number]  [facility] PROGRESS NOTE  [name] VHQ:[id number] DOB: 06-22-1916 DOA: 04/19/2015 PCP: Ron Parker, MD   Brief narrative:    81 y.o. female with a past medical history of Alzheimer's dementia, hypertension, COPD, anxiety, depression, GERD who was sent to the emergency department from [facility], the assisted living facility where she resides, after being diagnosed with a left lower extremity DVT by venous duplex today.   Assessment/Plan:      DVT, lower extremity, left - Lovenox per pharmacy  - Warfarin per pharmacy dosing. - Monitor APTT, PT/INR.    Acute on chronic kidney disease, stage III - Cr slowly trending down - BMP in AM    Alzheimer's dementia - Continue current medications. - Supportive care.   COPD (chronic obstructive pulmonary disease) (HCC) - stable respiratory status  - continue BD's as needed    HTN (hypertension) - Continue Bumex and hydralazine.  Code Status: DNR Family Communication:  plan of care discussed with the patient, son [name] over the phone  Disposition Plan: SNF by 12/02 or 12/03  IV access:  Peripheral IV  Procedures and diagnostic studies:    No results found.  Medical Consultants:  None  Other Consultants:  PT  IAnti-Infectives:   None   Debbora Presto, MD  [facility] Pager [phone]  If 7PM-7AM, please contact night-coverage [email or web address] Password [email or web address] 04/20/2015, 1:50 PM   LOS: 1 day   HPI/Subjective: No events overnight.   Objective: Filed Vitals:   04/19/15 2230 04/20/15 0507 04/20/15 0943 04/20/15 1300  BP: 143/45 151/60 116/93   Pulse: 62 63 72   Temp: 98.6 F (37 C) 97.8 F (36.6 C) 98.1 F (36.7 C)   TempSrc: Oral Oral Axillary   Resp: Height:     (1.626 m)  Weight: 49.7 kg (109 lb 9.1 oz)   51.8 kg (114 lb 3.2 oz)  SpO2: 100% 96% 96%     Intake/Output Summary (Last 24 hours) at  04/20/15 1350 Last data filed at 04/20/15 0810  Gross per 24 hour  Intake     30 ml  Output      0 ml  Net     30 ml    Exam:   General:  Pt is alert, follows commands appropriately, not in acute distress  Cardiovascular: Regular rate and rhythm, no rubs, no gallops  Respiratory: Clear to auscultation bilaterally, no wheezing, no crackles, no rhonchi  Abdomen: Soft, non tender, non distended, bowel sounds present, no guarding  Data Reviewed: Basic Metabolic Panel:  Recent Labs Lab 04/19/15 2113 04/20/15 0442  NA 140 140  K 4.5 4.3  CL 108 107  CO2 27 27  GLUCOSE 100* 81  BUN 20 18  CREATININE 1.32* 1.27*  CALCIUM 8.6* 8.5*  MG 2.2  --   PHOS 3.8  --    Liver Function Tests:  Recent Labs Lab 04/19/15 2113 04/20/15 0442  AST 17 14*  ALT 10* 10*  ALKPHOS 61 60  BILITOT 0.7 0.3  PROT 5.6* 5.4*  ALBUMIN 2.8* 2.5*   CBC:  Recent Labs Lab 04/19/15 2113 04/20/15 0442  WBC 9.9 6.4  NEUTROABS 7.6 4.3  HGB 10.0* 9.6*  HCT 31.6* 30.1*  MCV 85.9 87.8  PLT 275 293   Scheduled Meds: . bumetanide  0.5 mg Oral Once per day on Mon Thu  . citalopram  10 mg Oral Daily  .  donepezil  10 mg Oral QHS  . enoxaparin (LOVENOX) injection  50 mg Subcutaneous Q24H  . feeding supplement (ENSURE ENLIVE)  237 mL Oral BID BM  . hydrALAZINE  10 mg Oral BID  . hydrocerin  1 application Topical BID  . ketotifen  1 drop Both Eyes BID  . mirtazapine  30 mg Oral QHS  . pantoprazole  40 mg Oral Daily  . potassium chloride  10 mEq Oral Daily  . risperiDONE  0.25 mg Oral Daily  . risperiDONE  0.5 mg Oral QHS  . Vitamin D (Ergocalciferol)  50,000 Units Oral Q Thu  . Warfarin - Pharmacist Dosing Inpatient   Does not apply q1800   Continuous Infusions:

## 2015-04-20 NOTE — Plan of Care (Signed)
Problem: Education: Goal: Knowledge of [facility] Education information/materials will improve Outcome: Not Met (add Reason) Alzheimer's Dementia  Problem: Consults Goal: Diagnosis - Venous Thromboembolism (VTE) Choose a selection Outcome: Completed/Met Date Met:  04/20/15 DVT (Deep Vein Thrombosis) LLE

## 2015-04-20 NOTE — NC FL2 (Signed)
[place] MEDICAID FL2 LEVEL OF CARE SCREENING TOOL     IDENTIFICATION  Patient Name: [name] Birthdate: [birth year] Sex: female Admission Date (Current Location): 04/19/2015  [facility] and [place] Number:     Facility and Address:  [facility],  [address] [place] [address]      Provider Number: [id number]  Attending Physician Name and Address:  Dorothea Ogle, MD  Relative Name and Phone Number:       Current Level of Care: Hospital Recommended Level of Care: Memory Care Prior Approval Number:    Date Approved/Denied:   PASRR Number:    Discharge Plan: Other (Comment) [facility] )    Current Diagnoses: Patient Active Problem List   Diagnosis Date Noted  . DVT, lower extremity, left 04/19/2015  . BRBPR (bright red blood per rectum) 12/10/2014  . Acute blood loss anemia   . GI bleed 12/06/2014  . Acute GI bleeding 12/05/2014  . Anxiety 12/05/2014  . Alzheimer's dementia 12/05/2014  . COPD (chronic obstructive pulmonary disease) (HCC) 12/05/2014  . HTN (hypertension) 12/05/2014  . Acute kidney injury (HCC) 12/05/2014    Orientation ACTIVITIES/SOCIAL BLADDER RESPIRATION     (disoriented x 4)  Passive Incontinent Normal  BEHAVIORAL SYMPTOMS/MOOD NEUROLOGICAL BOWEL NUTRITION STATUS  Other (Comment) (Pt can be uncooperative with care.)   Incontinent Diet  PHYSICIAN VISITS COMMUNICATION OF NEEDS Height & Weight Skin    Verbally  (162.6 cm) 114 lbs. Other (Comment) (skin tear left tibia)          AMBULATORY STATUS RESPIRATION     (Pt uses w/c for moblization.) Normal      Personal Care Assistance Level of Assistance  Bathing, Feeding, Dressing Bathing Assistance: Maximum assistance Feeding assistance: Maximum assistance Dressing Assistance: Maximum assistance      Functional Limitations Info                SPECIAL CARE FACTORS FREQUENCY                      Additional Factors Info  Code  Status, Psychotropic Code Status Info: DNR             Current Medications (04/20/2015):  This is the current hospital active medication list Current Facility-Administered Medications  Medication Dose Route Frequency Provider Last Rate Last Dose  . acetaminophen (TYLENOL) tablet 500 mg  500 mg Oral Q6H PRN Bobette Mo, MD      . ALPRAZolam Prudy Feeler) tablet 0.25 mg  0.25 mg Oral Q6H PRN Bobette Mo, MD   0.25 mg at 04/20/15 1235  . alum & mag hydroxide-simeth (MAALOX/MYLANTA) 200-200-20 MG/5ML suspension 30 mL  30 mL Oral Q6H PRN Bobette Mo, MD      . bumetanide [facility]) tablet 0.5 mg  0.5 mg Oral Once per day on Mon Thu Bobette Mo, MD   0.5 mg at 04/20/15 0944  . citalopram (CELEXA) tablet 10 mg  10 mg Oral Daily Bobette Mo, MD   10 mg at 04/20/15 1236  . coumadin book   Does not apply Once Dorothea Ogle, MD      . donepezil (ARICEPT) tablet 10 mg  10 mg Oral QHS Bobette Mo, MD   10 mg at 04/20/15 0015  . enoxaparin (LOVENOX) injection 50 mg  50 mg Subcutaneous Q24H Aleda Grana, RPH      . feeding supplement (ENSURE ENLIVE) (ENSURE ENLIVE) liquid 237 mL  237 mL Oral BID BM Dorothea OgleIskra M Myers, MD      . guaiFENesin [facility](ROBITUSSIN) 100 MG/5ML solution 200 mg  200 mg Oral Q6H PRN Bobette Moavid Manuel Ortiz, MD      . hydrALAZINE (APRESOLINE) tablet 10 mg  10 mg Oral BID Bobette Moavid Manuel Ortiz, MD   10 mg at 04/20/15 [phone]  . hydrocerin (EUCERIN) cream 1 application  1 application Topical BID Bobette Moavid Manuel Ortiz, MD   1 application at 04/20/15 [phone]  . ketotifen (ZADITOR) 0.025 % ophthalmic solution 1 drop  1 drop Both Eyes BID Bobette Moavid Manuel Ortiz, MD   1 drop at 04/20/15 0015  . loperamide (IMODIUM) capsule 2 mg  2 mg Oral PRN Bobette Moavid Manuel Ortiz, MD      . magnesium hydroxide (MILK OF MAGNESIA) suspension 30 mL  30 mL Oral Daily PRN Bobette Moavid Manuel Ortiz, MD      . mirtazapine (REMERON) tablet 30 mg  30 mg Oral QHS Bobette Moavid Manuel Ortiz, MD   30 mg at 04/20/15 0710  .  pantoprazole (PROTONIX) EC tablet 40 mg  40 mg Oral Daily Bobette Moavid Manuel Ortiz, MD   40 mg at 04/20/15 0946  . potassium chloride 20 MEQ/15ML (10%) solution 10 mEq  10 mEq Oral Daily Bobette Moavid Manuel Ortiz, MD   10 mEq at 04/20/15 0946  . risperiDONE (RISPERDAL) tablet 0.25 mg  0.25 mg Oral Daily Bobette Moavid Manuel Ortiz, MD   0.25 mg at 04/20/15 1236  . risperiDONE (RISPERDAL) tablet 0.5 mg  0.5 mg Oral QHS Bobette Moavid Manuel Ortiz, MD   0.5 mg at 04/20/15 0015  . Vitamin D (Ergocalciferol) (DRISDOL) capsule 50,000 Units  50,000 Units Oral Q Thu Bobette Moavid Manuel Ortiz, MD   50,000 Units at 04/20/15 [phone]  . warfarin (COUMADIN) video   Does not apply Once Dorothea OgleIskra M Myers, MD      . Warfarin - Pharmacist Dosing Inpatient   Does not apply q1800 Aleda GranaDustin G Zeigler, RPH   0  at 04/20/15 1450     Discharge Medications: Please see discharge summary for a list of discharge medications.  Relevant Imaging Results:  Relevant Lab Results:  Recent Labs    Additional Information Please continue Hospice Services at facility.  Matteo Banke, Dickey GaveJamie Lee, LCSW

## 2015-04-20 NOTE — Progress Notes (Signed)
CSW has provided clinical info to [facility] and has informed NSG that MD may d/c pt tomorrow. [facility] reports that they may need to preform an on site assessment prior to pt returning to facility. CSW will contact Clydie BraunKaren, Production designer, theatre/television/filmmanager of memory care unit, to confirm if assessment is needed on 12/2. NSG is unable to complete assessment today.   Cori RazorJamie Jestin Burbach LCSW [phone]

## 2015-04-20 NOTE — Progress Notes (Addendum)
ANTICOAGULATION CONSULT NOTE - Follow Up Consult  Pharmacy Consult for Warfarin Indication: DVT  No Known Allergies  Patient Measurements: Height: 5\' 4"  (162.6 cm) Weight: 114 lb 3.2 oz (51.8 kg) IBW/kg (Calculated) : 54.7  Vital Signs: Temp: 98.5 F (36.9 C) (12/01 1432) Temp Source: Oral (12/01 1432) BP: 137/47 mmHg (12/01 1432) Pulse Rate: 70 (12/01 1432)  Labs:  Recent Labs  04/19/15 2113 04/20/15 0442  HGB 10.0* 9.6*  HCT 31.6* 30.1*  PLT 275 293  APTT 32  --   LABPROT 15.0  --   INR 1.16  --   CREATININE 1.32* 1.27*    Estimated Creatinine Clearance: 21.2 mL/min (by C-G formula based on Cr of 1.27).   Assessment: [age] y/oF with advanced dementia presents from SNF with acute VTE diagnosed at facility. Original plan was to discharge back to facility but due to inability of facility to give injection, patient admitted for bridge therapy. Pharmacy asked to dose warfarin for acute VTE  Today, 04/20/2015:  INR 1.16 (on 11/30 at 2113)  Hgb low at 9.6, but appears stable compared to previous values. Pltc WNL.  No bleeding issues reported.  Heart healthy diet ordered: 5% of breakfast, 30% of lunch charted  No major DDIs present  Goal of Therapy:  INR 2-3 Monitor platelets by anticoagulation protocol: Yes   Plan:  Day 1 of minimum 5-day overlap for acute VTE  Warfarin dose of 2.5 mg ordered for 04/19/15 PM was not given due to lethargy. This dose was administered to the patient at 1235 today. Therefore, will not give anymore warfarin today.   Continue Enoxaparin 50 mg SQ q24h for a minimum of 5 day overlap and until INR =/> 2 for > 24 hours.  Daily PT/INR.  CBC at least q72h while in the hospital (currently ordered daily x 4).  Monitor closely for s/s of bleeding.  Given patient's advanced dementia, will provide warfarin education prior to discharge when family is available.    Greer PickerelJigna Biana Haggar, PharmD, BCPS Pager: [phone] 04/20/2015 2:47 PM

## 2015-04-20 NOTE — Clinical Social Work Note (Signed)
Clinical Social Work Assessment  Patient Details  Name: [name] MRN: [id number] Date of Birth: June 02, 1916  Date of referral:  04/20/15               Reason for consult:  Facility Placement, Discharge Planning                Permission sought to share information with:  Facility Art therapist granted to share information::  Yes, Verbal Permission Granted  Name::        Agency::     Relationship::     Contact Information:     Housing/Transportation Living arrangements for the past 2 months:   (Memory Care Unit at [facility]) Source of Information:  Adult Children Patient Interpreter Needed:  None Criminal Activity/Legal Involvement Pertinent to Current Situation/Hospitalization:  No - Comment as needed Significant Relationships:  Adult Children Lives with:  Facility Resident Do you feel safe going back to the place where you live?  Yes Need for family participation in patient care:  Yes (Comment)  Care giving concerns: Family feels pt would do better discharging back to memory care unit with hospice due to her confusion.   Social Worker assessment / plan: Pt hospitalized on 04/19/15 with a DVT, lower extremity. Pt has a dx of Alzheimer's dementia and is from [facility] [place] unit. Pt has been confused, anxious and uncooperative with care during hospitalization. CSW met with pt's son and spouse to assist with d/c planning. Family would like pt to return to [facility] with continued hospice at d/c. Palliative Care team has been consulted to assist with [place]. CSW has contacted [facility] for info on pt and to discuss d/c planning. NSG at [facility] reports that pt required assistance with all ADL's. She used a w/c and was non ambulatory. CSW is waiting for return call from manager at [facility] to discuss d/c planning.  Employment status:  Retired Nurse, adult PT Recommendations:  Not assessed at this  time Information / Referral to community resources:     Patient/Family's Response to care:  Disposition to be determined.  Patient/Family's Understanding of and Emotional Response to Diagnosis, Current Treatment, and Prognosis: Family to review medical status with MD. Family finds it difficult / heartbreaking  to see pt at this stage of her Alzheimer's dementia. Support / reassurance offered.  Emotional Assessment Appearance:  Appears stated age Attitude/Demeanor/Rapport:  Uncooperative Affect (typically observed):  Anxious Orientation:   (Disoriented x 4) Alcohol / Substance use:  Not Applicable Psych involvement (Current and /or in the community):  No (Comment)  Discharge Needs  Concerns to be addressed:  Discharge Planning Concerns Readmission within the last 30 days:  No Current discharge risk:  None Barriers to Discharge:  No Barriers Identified   [name], [place] 04/20/2015, 1:18 PM

## 2015-04-21 DIAGNOSIS — Z66 Do not resuscitate: Secondary | ICD-10-CM

## 2015-04-21 DIAGNOSIS — G309 Alzheimer's disease, unspecified: Secondary | ICD-10-CM

## 2015-04-21 DIAGNOSIS — F0280 Dementia in other diseases classified elsewhere without behavioral disturbance: Secondary | ICD-10-CM

## 2015-04-21 DIAGNOSIS — Z515 Encounter for palliative care: Secondary | ICD-10-CM

## 2015-04-21 LAB — CBC WITH DIFFERENTIAL/PLATELET
BASOS ABS: 0 10*3/uL (ref 0.0–0.1)
Basophils Relative: 1 %
EOS ABS: 0.2 10*3/uL (ref 0.0–0.7)
EOS PCT: 3 %
HCT: 34.3 % — ABNORMAL LOW (ref 36.0–46.0)
Hemoglobin: 10.9 g/dL — ABNORMAL LOW (ref 12.0–15.0)
Lymphocytes Relative: 21 %
Lymphs Abs: 1.1 10*3/uL (ref 0.7–4.0)
MCH: 28 pg (ref 26.0–34.0)
MCHC: 31.8 g/dL (ref 30.0–36.0)
MCV: 88.2 fL (ref 78.0–100.0)
Monocytes Absolute: 0.6 10*3/uL (ref 0.1–1.0)
Monocytes Relative: 11 %
Neutro Abs: 3.4 10*3/uL (ref 1.7–7.7)
Neutrophils Relative %: 64 %
PLATELETS: 314 10*3/uL (ref 150–400)
RBC: 3.89 MIL/uL (ref 3.87–5.11)
RDW: 14.7 % (ref 11.5–15.5)
WBC: 5.3 10*3/uL (ref 4.0–10.5)

## 2015-04-21 LAB — COMPREHENSIVE METABOLIC PANEL
ALBUMIN: 3.1 g/dL — AB (ref 3.5–5.0)
ALK PHOS: 68 U/L (ref 38–126)
ALT: 12 U/L — ABNORMAL LOW (ref 14–54)
ANION GAP: 8 (ref 5–15)
AST: 20 U/L (ref 15–41)
BILIRUBIN TOTAL: 0.5 mg/dL (ref 0.3–1.2)
BUN: 17 mg/dL (ref 6–20)
CALCIUM: 9.1 mg/dL (ref 8.9–10.3)
CO2: 28 mmol/L (ref 22–32)
Chloride: 103 mmol/L (ref 101–111)
Creatinine, Ser: 1.36 mg/dL — ABNORMAL HIGH (ref 0.44–1.00)
GFR calc non Af Amer: 32 mL/min — ABNORMAL LOW (ref >60)
GFR, EST AFRICAN AMERICAN: 37 mL/min — AB (ref >60)
GLUCOSE: 88 mg/dL (ref 65–99)
POTASSIUM: 3.7 mmol/L (ref 3.5–5.1)
SODIUM: 139 mmol/L (ref 135–145)
TOTAL PROTEIN: 6.5 g/dL (ref 6.5–8.1)

## 2015-04-21 LAB — PROTIME-INR
INR: 1.12 (ref 0.00–1.49)
PROTHROMBIN TIME: 14.6 s (ref 11.6–15.2)

## 2015-04-21 MED ORDER — ENOXAPARIN SODIUM 100 MG/ML ~~LOC~~ SOLN: 50.0000 mg | SUBCUTANEOUS | Status: DC

## 2015-04-21 MED ORDER — ALPRAZOLAM 0.25 MG PO TABS: 0.2500 mg | ORAL_TABLET | Freq: Four times a day (QID) | ORAL | Status: DC | PRN

## 2015-04-21 MED ORDER — WARFARIN SODIUM 5 MG PO TABS: 5.0000 mg | ORAL_TABLET | Freq: Once | ORAL | Status: DC

## 2015-04-21 MED ORDER — WARFARIN SODIUM 5 MG PO TABS
5.0000 mg | ORAL_TABLET | Freq: Once | ORAL | Status: AC
  Administered 2015-04-21: 5 mg via ORAL
  Filled 2015-04-21: qty 1

## 2015-04-21 NOTE — Progress Notes (Signed)
[name] from [facility] has has agreed to accept pt back to memory care unit today. Pt will continue with hospice services at facility. HH services and scripts for any new medications are needed. MD notified. CSW will contact pt's son to provide update.  Cori RazorJamie Dannelle Rhymes LCSW [phone]

## 2015-04-21 NOTE — Progress Notes (Addendum)
ANTICOAGULATION CONSULT NOTE - Follow Up Consult  Pharmacy Consult for Warfarin Indication: DVT  No Known Allergies  Patient Measurements: Height: 5\' 4"  (162.6 cm) Weight: 114 lb 3.2 oz (51.8 kg) IBW/kg (Calculated) : 54.7  Vital Signs: Temp: 98.2 F (36.8 C) (12/02 0514) Temp Source: Axillary (12/02 0514) BP: 163/74 mmHg (12/02 0514) Pulse Rate: 83 (12/02 0514)  Labs:  Recent Labs  04/19/15 2113 04/20/15 0442 04/21/15 0500  HGB 10.0* 9.6* 10.9*  HCT 31.6* 30.1* 34.3*  PLT 275 293 314  APTT 32  --   --   LABPROT 15.0  --  14.6  INR 1.16  --  1.12  CREATININE 1.32* 1.27* 1.36*    Estimated Creatinine Clearance: 19.8 mL/min (by C-G formula based on Cr of 1.36).   Assessment: [age] y/oF with advanced dementia presents from SNF with acute VTE diagnosed at facility. Original plan was to discharge back to facility but due to inability of facility to give injection, patient admitted for bridge therapy. Pharmacy asked to dose warfarin for acute VTE  Today, 04/21/2015:  INR 1.12  Hgb increased to  9.6, but appears stable compared to previous values. Pltc WNL.  No bleeding issues reported.  On 12/1, Heart healthy diet ordered: 5% of breakfast, 30% of lunch charted, 75% dinner  No major DDIs present  Goal of Therapy:  INR 2-3 Monitor platelets by anticoagulation protocol: Yes   Plan:  Day 2 of minimum 5-day overlap for acute VTE  Warfarin dose of 5 mg ordered for 12/2  Continue Enoxaparin 50 mg SQ q24h for a minimum of 5 day overlap and until INR =/> 2 for > 24 hours.  Daily PT/INR.  CBC at least q72h while in the hospital (currently ordered daily x 4).  Monitor closely for s/s of bleeding.  Given patient's advanced dementia, will provide warfarin education prior to discharge when family is available.    Adalberto ColeNikola Sae Handrich, PharmD, BCPS Pager [phone] 04/20/2015 2:47 PM

## 2015-04-21 NOTE — Progress Notes (Signed)
Awaiting Palliative care team input prior to discharge, ? If pt eligible for residential hospice vs SNF with hospice. Son would probably prefer SNF with hospice.   Sherri PrestoMAGICK-Amedio Bowlby, MD  [facility] Pager [phone] Cell [phone]  If 7PM-7AM, please contact night-coverage [email or web address] Password [email or web address]

## 2015-04-21 NOTE — Progress Notes (Signed)
CSW assisting wit d/c planning. CSW informed safety sitter was needed last night due to pt being uncooperative and was trying to get out of bed.   Message left for Clydie BraunKaren, Production designer, theatre/television/filmmanager of memory care unit at [facility], to contact CSW for assistance with d/c planning. Possible on site assessment may be needed to determine if [facility].   Cori RazorJamie Mikaeel Petrow LCSW [phone]

## 2015-04-21 NOTE — Discharge Summary (Addendum)
Physician Discharge Summary  [name] ZOX:[id number] DOB: 01/16/1917 DOA: 04/19/2015  PCP: Ron ParkerBOWEN,SAMUEL, MD  Admit date: 04/19/2015 Discharge date: 04/21/2015  Recommendations for Outpatient Follow-up:  1. Pt will need to follow up with PCP in 1-2 weeks post discharge 2. Pt needs PT/INR check in Am to continue adjusting the dose of Lovenox and Coumadin as indicated  3. Pt will be discharge to memory care unit with hospice to continue to follow   Discharge Diagnoses:  Principal Problem:   DVT, lower extremity, left Active Problems:   Alzheimer's dementia   COPD (chronic obstructive pulmonary disease) (HCC)   HTN (hypertension)  Discharge Condition: Stable  Diet recommendation: As tolerated    Brief narrative:    81 y.o. female with a past medical history of Alzheimer's dementia, hypertension, COPD, anxiety, depression, GERD who was [facility] living facility where she resides, after being diagnosed with a left lower extremity DVT by venous duplex today.   Assessment/Plan:     DVT, lower extremity, left - Lovenox per pharmacy  - Warfarin per pharmacy dosing. - Monitor APTT, PT/INR.   Acute on chronic kidney disease, stage III - Cr still elevated    Alzheimer's dementia - Continue current medications. - Supportive care.   COPD (chronic obstructive pulmonary disease) (HCC) - stable respiratory status  - continue BD's as needed    HTN (hypertension) - Continue Bumex and hydralazine.  Code Status: DNR Family Communication: plan of care discussed with the patient, son Thereasa DistanceRodney over the phone  Disposition Plan: SNF   IV access:  Peripheral IV  Procedures and diagnostic studies:    Imaging Results    No results found.    Medical Consultants:  None  Other Consultants:  PT  IAnti-Infectives:   None       Discharge Exam: Filed Vitals:   04/20/15 1954 04/21/15  0514  BP: 156/59 163/74  Pulse: 74 83  Temp: 98.3 F (36.8 C) 98.2 F (36.8 C)  Resp: 16 16   Filed Vitals:   04/20/15 1300 04/20/15 1432 04/20/15 1954 04/21/15 0514  BP:  137/47 156/59 163/74  Pulse:  70 74 83  Temp:  98.5 F (36.9 C) 98.3 F (36.8 C) 98.2 F (36.8 C)  TempSrc:  Oral Oral Axillary  Resp:  16 16 16   Height: 5\' 4"  (1.626 m)     Weight: 51.8 kg (114 lb 3.2 oz)     SpO2:  100% 97% 98%    General: Pt is alert, follows commands appropriately, not in acute distress Cardiovascular: Regular rate and rhythm, no rubs, no gallops Respiratory: Clear to auscultation bilaterally, no wheezing, no crackles, no rhonchi Abdominal: Soft, non tender, non distended, bowel sounds +, no guarding  Discharge Instructions  Discharge Instructions    Diet - low sodium heart healthy    Complete by:  As directed      Increase activity slowly    Complete by:  As directed             Medication List    STOP taking these medications        pantoprazole 40 MG tablet  Commonly known as:  PROTONIX     sulfamethoxazole-trimethoprim 800-160 MG tablet  Commonly known as:  BACTRIM DS,SEPTRA DS      TAKE these medications        acetaminophen 500 MG tablet  Commonly known as:  TYLENOL  Take 500 mg by mouth every 6 (six)  hours as needed for mild pain.     ALPRAZolam 0.25 MG tablet  Commonly known as:  XANAX  Take 1 tablet (0.25 mg total) by mouth every 6 (six) hours as needed for anxiety (agitation). Not to exceed 6 tablets in 24 hours     alum & mag hydroxide-simeth 200-200-20 MG/5ML suspension  Commonly known as:  MAALOX/MYLANTA  Take 30 mLs by mouth every 6 (six) hours as needed for indigestion or heartburn.     azelastine 0.05 % ophthalmic solution  Commonly known as:  OPTIVAR  Place 1 drop into both eyes 2 (two) times daily.     bumetanide 1 MG tablet  Commonly known as:  BUMEX  Take 0.5 mg by mouth 2 (two) times a week. Monday and Thursday.     citalopram 10 MG  tablet  Commonly known as:  CELEXA  Take 10 mg by mouth daily.     donepezil 10 MG tablet  Commonly known as:  ARICEPT  Take 10 mg by mouth at bedtime.     enoxaparin 100 MG/ML injection  Commonly known as:  LOVENOX  Inject 0.5 mLs (50 mg total) into the skin daily.     ENSURE  Take 237 mLs by mouth 3 (three) times daily with meals.     esomeprazole 20 MG capsule  Commonly known as:  NEXIUM  Take 20 mg by mouth daily at 12 noon.     guaifenesin 100 MG/5ML syrup  Commonly known as:  ROBITUSSIN  Take 200 mg by mouth every 6 (six) hours as needed for cough.     hydrALAZINE 10 MG tablet  Commonly known as:  APRESOLINE  Take 1 tablet (10 mg total) by mouth 2 (two) times daily.     hydrocerin Crea  Apply 1 application topically 2 (two) times daily.     loperamide 2 MG capsule  Commonly known as:  IMODIUM  Take 2 mg by mouth as needed for diarrhea or loose stools (not to exceed 8 doses in 24 hours).     magnesium hydroxide 400 MG/5ML suspension  Commonly known as:  MILK OF MAGNESIA  Take 30 mLs by mouth daily as needed for mild constipation.     mirtazapine 30 MG tablet  Commonly known as:  REMERON  Take 30 mg by mouth at bedtime.     potassium chloride 20 MEQ/15ML (10%) Soln  Take 10 mEq by mouth daily.     risperiDONE 0.25 MG tablet  Commonly known as:  RISPERDAL  Take 1 tablet (0.25 mg total) by mouth daily.     risperiDONE 0.5 MG tablet  Commonly known as:  RISPERDAL  Take 1 tablet (0.5 mg total) by mouth at bedtime.     TRIPLE ANTIBIOTIC EX  Apply 1 application topically. Apply after cleaning with normal saline, cover with bandaid or gauze and secure with tape. Change as needed until healed for skin tears, abrasions, or minor irritations.     Vitamin D (Ergocalciferol) 50000 UNITS Caps capsule  Commonly known as:  DRISDOL  Take 50,000 Units by mouth every Thursday.     warfarin 5 MG tablet  Commonly known as:  COUMADIN  Take 1 tablet (5 mg total) by mouth  one time only at 6 PM.            Follow-up Information    Follow up with Ron Parker, MD.   Specialty:  Internal Medicine       The results of significant diagnostics from this hospitalization (  including imaging, microbiology, ancillary and laboratory) are listed below for reference.     Microbiology: No results found for this or any previous visit (from the past 240 hour(s)).   Labs: Basic Metabolic Panel:  Recent Labs Lab 04/19/15 2113 04/20/15 0442 04/21/15 0500  NA 140 140 139  K 4.5 4.3 3.7  CL 108 107 103  CO2 GLUCOSE 100* 81 88  BUN CREATININE 1.32* 1.27* 1.36*  CALCIUM 8.6* 8.5* 9.1  MG 2.2  --   --   PHOS 3.8  --   --    Liver Function Tests:  Recent Labs Lab 04/19/15 2113 04/20/15 0442 04/21/15 0500  AST 17 14* 20  ALT 10* 10* 12*  ALKPHOS 61 60 68  BILITOT 0.7 0.3 0.5  PROT 5.6* 5.4* 6.5  ALBUMIN 2.8* 2.5* 3.1*   CBC:  Recent Labs Lab 04/19/15 2113 04/20/15 0442 04/21/15 0500  WBC 9.9 6.4 5.3  NEUTROABS 7.6 4.3 3.4  HGB 10.0* 9.6* 10.9*  HCT 31.6* 30.1* 34.3*  MCV 85.9 87.8 88.2  PLT 275 293 314   SIGNED: Time coordinating discharge: 30 minutes  MAGICK-Rikita Grabert, MD  [facility] 04/21/2015, 8:58 AM Pager [phone]  If 7PM-7AM, please contact night-coverage [email or web address] Password [email or web address]

## 2015-04-21 NOTE — Progress Notes (Addendum)
CSW spoke with [name] ( NSG ) at [facility]. [name] is unable to accept pt back to facility on Lovenox. MD alerted. Earliest d/c back to memory care unit with coumadin will be Tuesday. Pt presently has a Recruitment consultantsafety sitter. [name], at [facility], reports pt doesn't need to be 24hr sitter free to return to memory care ( once off Lovenox ). Facility CNA, at night, provides coverage to  help prevent falls. In order for pt to d/c to SNF pt does need to be without sitter 24 hrs. Pt is disoriented x 4 combative with care and has no rehab potential. Son feels strongly about pt returning [facility] where, he feels,  staff have done a great job managing her care. CSW has contacted Enter[employer] CorporationHope Rife, Interior and spatial designerDirector of CSW to review case. Hope has recommended [name] be contacted for recommendations. Awaiting return call from MD. [name]  will be contacted with update following Dr. Lanell MatarAronson's return call.  Cori RazorJamie Markeda Narvaez LCSW [phone]   [phone]   1528 : Dr. Lanell MatarAronson's recommendations reviewed with Dr. Izola Ramos. No safe alternative to Lovenox is available. Dr. Izola Ramos has reviewed medications with pharmacy. . Pt will remain hospitalized until able to [facility]. Family has been notified.  Cori RazorJamie Deronda Christian LCSW [phone]

## 2015-04-21 NOTE — Consult Note (Signed)
Consultation Note Date: 04/21/2015   Patient Name: [name]  DOB: 02/23/1917  MRN: [id number]  Age / Sex: 81 y.o., female  PCP: Ron ParkerSamuel Bowen, MD Referring Physician: Dorothea OgleIskra M Myers, MD  Reason for Consultation: Establishing goals of care and Psychosocial/spiritual support    Clinical Assessment/Narrative:  81 yo female with ES-dementia with continued slow physical, functional and cognitive decline admitted from assisted living facility [facility] with DVT.   This NP Lorinda CreedMary Nakeeta Sebastiani reviewed medical records, received report from team, assessed the patient and then spoke with patient's son by telephone (he lives three hours out of town)  to discuss diagnosis, prognosis, GOC, EOL wishes disposition and options.   A detailed discussion was had today regarding advanced directives.  Concepts specific to code status, artifical feeding and hydration, continued IV antibiotics and rehospitalization was had.  The difference between a aggressive medical intervention path  and a palliative comfort care path for this patient at this time was had.  Values and goals of care important to patient and family were attempted to be elicited.  Discussed in detail the natural trajectory and expectations  of dementia.  He verbalizes that he see little difference in his mothers baseline as of yesterday.  Concept of Hospice and Palliative Care were discussed  Natural trajectory and expectations at EOL were discussed.  Questions and concerns addressed.  Family encouraged to call with questions or concerns.  PMT will continue to support holistically.  At this time SW Asher MuirJamie [facility].  Patient has lived there for three years.  At this time family wishes to continue to treat the treatable and hopes for prolongation of life.  "I hope I can see my mother one more time  before Christmas" (son [place] [address] [place]  Family requests that all medciations be continued as long as patietn can take them and is ok with add on of anticoagualnt for DVT.  Family will need continued guidance on realistic anticipatory care options as patient continues to fail to thrive.   Primary Decision Maker:  son/ [facility] Distance[facility] St[facility]r/guardianship   HCPOA: guardainship   SUMMARY OF RECOMMENDATIONS  -DNR/DNI -[facility] if [facility]  Code Status/Advance Care Planning:  DNR      Code Status Orders        Start     Ordered   04/20/15 0004  Do not attempt resuscitation (DNR)   Continuous    Question Answer Comment  In the event of cardiac or respiratory ARREST Do not call a "code blue"   In the event of cardiac or respiratory ARREST Do not perform Intubation, CPR, defibrillation or ACLS   In the event of cardiac or respiratory ARREST Use medication by any route, position, wound care, and other measures to relive pain and suffering. May use oxygen, suction and manual treatment of airway obstruction as needed for comfort.      04/20/15 0004    Advance Directive Documentation        Most Recent Value   Type of Advance Directive  Out of facility DNR (pink MOST or yellow form)   Pre-existing out of facility DNR order (yellow form or pink MOST form)     "MOST" Form in Place?         Palliative Prophylaxis:    Aspiration, Bowel Regimen, Delirium Protocol, Frequent Pain Assessment and Oral Care   Psycho-social/Spiritual:   Support System: Poor  Additional Recommendations: Education on Hospice  Prognosis: < than 6 months   Discharge Planning: [facility] with hospice as prior to admission   Chief Complaint/ Primary Diagnoses: Present on Admission:  . DVT, lower extremity, left . HTN (hypertension) . COPD (chronic obstructive pulmonary disease) (HCC) . Alzheimer's dementia  I have reviewed the medical record,  interviewed the patient and family, and examined the patient. The following aspects are pertinent.  Past Medical History  Diagnosis Date  . GERD (gastroesophageal reflux disease)   . Anxiety   . Dementia   . Alzheimer disease   . COPD (chronic obstructive pulmonary disease) (HCC)   . Hypertension   . Renal disorder    Social History   Social History  . Marital Status: Divorced    Spouse Name: N/A  . Number of Children: N/A  . Years of Education: N/A   Social History Main Topics  . Smoking status: Never Smoker   . Smokeless tobacco: None  . Alcohol Use: No  . Drug Use: No  . Sexual Activity: Not Asked   Other Topics Concern  . None   Social History Narrative   No family history on file. Scheduled Meds: . bumetanide  0.5 mg Oral Once per day on Mon Thu  . citalopram  10 mg Oral Daily  . donepezil  10 mg Oral QHS  . enoxaparin (LOVENOX) injection  50 mg Subcutaneous Q24H  . feeding supplement (ENSURE ENLIVE)  237 mL Oral BID BM  . hydrALAZINE  10 mg Oral BID  . hydrocerin  1 application Topical BID  . ketotifen  1 drop Both Eyes BID  . mirtazapine  30 mg Oral QHS  . pantoprazole  40 mg Oral Daily  . potassium chloride  10 mEq Oral Daily  . risperiDONE  0.25 mg Oral Daily  . risperiDONE  0.5 mg Oral QHS  . Vitamin D (Ergocalciferol)  50,000 Units Oral Q Thu  . Warfarin - Pharmacist Dosing Inpatient   Does not apply q1800   Continuous Infusions:  PRN Meds:.acetaminophen, ALPRAZolam, alum & mag hydroxide-simeth, guaiFENesin, loperamide, magnesium hydroxide Medications Prior to Admission:  Prior to Admission medications   Medication Sig Start Date End Date Taking? Authorizing Provider  azelastine (OPTIVAR) 0.05 % ophthalmic solution Place 1 drop into both eyes 2 (two) times daily.   Yes Historical Provider, MD  citalopram (CELEXA) 10 MG tablet Take 10 mg by mouth daily.   Yes Historical Provider, MD  donepezil (ARICEPT) 10 MG tablet Take 10 mg by mouth at bedtime.    Yes Historical Provider, MD  ENSURE (ENSURE) Take 237 mLs by mouth 3 (three) times daily with meals.   Yes Historical Provider, MD  esomeprazole (NEXIUM) 20 MG capsule Take 20 mg by mouth daily at 12 noon.   Yes Historical Provider, MD  hydrALAZINE (APRESOLINE) 10 MG tablet Take 1 tablet (10 mg total) by mouth 2 (two) times daily. 12/07/14  Yes Alison Murray, MD  hydrocerin (EUCERIN) CREA Apply 1 application topically 2 (two) times daily.   Yes Historical Provider, MD  mirtazapine (REMERON) 30 MG tablet Take 30 mg by mouth at bedtime.   Yes Historical Provider, MD  potassium chloride 20 MEQ/15ML (10%) SOLN Take 10 mEq by mouth daily.    Yes Historical Provider, MD  risperiDONE (RISPERDAL) 0.25 MG tablet Take 1 tablet (0.25 mg total) by mouth daily. 12/07/14  Yes Alison Murray, MD  risperiDONE (RISPERDAL) 0.5 MG tablet Take 1 tablet (0.5 mg total) by  mouth at bedtime. 12/07/14  Yes Alison Murray, MD  sulfamethoxazole-trimethoprim (BACTRIM DS,SEPTRA DS) 800-160 MG tablet Take 1 tablet by mouth 2 (two) times daily. ABT Start Date 04/19/15 and End Date 04/25/15.   Yes Historical Provider, MD  acetaminophen (TYLENOL) 500 MG tablet Take 500 mg by mouth every 6 (six) hours as needed for mild pain.    Historical Provider, MD  ALPRAZolam Prudy Feeler) 0.25 MG tablet Take 1 tablet (0.25 mg total) by mouth every 6 (six) hours as needed for anxiety (agitation). Not to exceed 6 tablets in 24 hours 04/21/15   Dorothea Ogle, MD  alum & mag hydroxide-simeth (MAALOX/MYLANTA) 200-200-20 MG/5ML suspension Take 30 mLs by mouth every 6 (six) hours as needed for indigestion or heartburn.    Historical Provider, MD  bumetanide (BUMEX) 1 MG tablet Take 0.5 mg by mouth 2 (two) times a week. Monday and Thursday.    Historical Provider, MD  guaifenesin (ROBITUSSIN) 100 MG/5ML syrup Take 200 mg by mouth every 6 (six) hours as needed for cough.     Historical Provider, MD  loperamide (IMODIUM) 2 MG capsule Take 2 mg by mouth as needed for  diarrhea or loose stools (not to exceed 8 doses in 24 hours).    Historical Provider, MD  magnesium hydroxide (MILK OF MAGNESIA) 400 MG/5ML suspension Take 30 mLs by mouth daily as needed for mild constipation.    Historical Provider, MD  Neomycin-Bacitracin-Polymyxin (TRIPLE ANTIBIOTIC EX) Apply 1 application topically. Apply after cleaning with normal saline, cover with bandaid or gauze and secure with tape. Change as needed until healed for skin tears, abrasions, or minor irritations.    Historical Provider, MD  pantoprazole (PROTONIX) 40 MG tablet Take 1 tablet (40 mg total) by mouth 2 (two) times daily. Patient not taking: Reported on 04/19/2015 12/07/14   Alison Murray, MD  Vitamin D, Ergocalciferol, (DRISDOL) 50000 UNITS CAPS capsule Take 50,000 Units by mouth every Thursday.    Historical Provider, MD   No Known Allergies  Review of Systems  Unable to perform ROS   Physical Exam  Constitutional:  Chronically ill appearing, frail  Respiratory:  Decreased in bases,   GI: Soft. Bowel sounds are normal.  Neurological:  Alert, follows simple commands, minimally verbal 2/2 dementia  Skin: Skin is warm and dry.    Vital Signs: BP 163/74 mmHg  Pulse 83  Temp(Src) 98.2 F (36.8 C) (Axillary)  Resp 16  Ht  (1.626 m)  Wt 51.8 kg (114 lb 3.2 oz)  BMI 19.59 kg/m2  SpO2 98%  SpO2: SpO2: 98 % O2 Device:SpO2: 98 % O2 Flow Rate: .   IO: Intake/output summary:  Intake/Output Summary (Last 24 hours) at 04/21/15 1202 Last data filed at 04/21/15 5643  Gross per 24 hour  Intake    840 ml  Output      0 ml  Net    840 ml    LBM: Last BM Date:  (UTA) Baseline Weight: Weight: 49.7 kg (109 lb 9.1 oz) Most recent weight: Weight: 51.8 kg (114 lb 3.2 oz)      Palliative Assessment/Data:  Flowsheet Rows        Most Recent Value   Intake Tab    Referral Department  Hospitalist   Unit at Time of Referral  Orthopedic Unit   Palliative Care Primary Diagnosis  Neurology   Date  Notified  04/20/15   Palliative Care Type  New Palliative care   Reason for referral  Clarify  Goals of Care   Date of Admission  04/19/15   # of days IP prior to Palliative referral  1   Clinical Assessment    Psychosocial & Spiritual Assessment    Palliative Care Outcomes       Additional Data Reviewed:  CBC:    Component Value Date/Time   WBC 5.3 04/21/2015 0500   HGB 10.9* 04/21/2015 0500   HCT 34.3* 04/21/2015 0500   PLT 314 04/21/2015 0500   MCV 88.2 04/21/2015 0500   NEUTROABS 3.4 04/21/2015 0500   LYMPHSABS 1.1 04/21/2015 0500   MONOABS 0.6 04/21/2015 0500   EOSABS 0.2 04/21/2015 0500   BASOSABS 0.0 04/21/2015 0500   Comprehensive Metabolic Panel:    Component Value Date/Time   NA 139 04/21/2015 0500   K 3.7 04/21/2015 0500   CL 103 04/21/2015 0500   CO2 28 04/21/2015 0500   BUN 17 04/21/2015 0500   CREATININE 1.36* 04/21/2015 0500   GLUCOSE 88 04/21/2015 0500   CALCIUM 9.1 04/21/2015 0500   AST 20 04/21/2015 0500   ALT 12* 04/21/2015 0500   ALKPHOS 68 04/21/2015 0500   BILITOT 0.5 04/21/2015 0500   PROT 6.5 04/21/2015 0500   ALBUMIN 3.1* 04/21/2015 0500   Discussed with  Time In: 1130 Time Out: 1230 Time Total: 60 min Greater than 50%  of this time was spent counseling and coordinating care related to the above assessment and plan.  Signed by: Lorinda Creed, NP  Canary Brim, NP  04/21/2015, 12:02 PM  Please contact Palliative Medicine Team phone at [phone] for questions and concerns.

## 2015-04-21 NOTE — Discharge Instructions (Signed)
Deep Vein Thrombosis °A deep vein thrombosis (DVT) is a blood clot (thrombus) that usually occurs in a deep, larger vein of the lower leg or the pelvis, or in an upper extremity such as the arm. These are dangerous and can lead to serious and even life-threatening complications if the clot travels to the lungs. °A DVT can damage the valves in your leg veins so that instead of flowing upward, the blood pools in the lower leg. This is called post-thrombotic syndrome, and it can result in pain, swelling, discoloration, and sores on the leg. °CAUSES °A DVT is caused by the formation of a blood clot in your leg, pelvis, or arm. Usually, several things contribute to the formation of blood clots. A clot may develop when: °· Your blood flow slows down. °· Your vein becomes damaged in some way. °· You have a condition that makes your blood clot more easily. °RISK FACTORS °A DVT is more likely to develop in: °· People who are older, especially over 60 years of age. °· People who are overweight (obese). °· People who sit or lie still for a long time, such as during long-distance travel (over 4 hours), bed rest, hospitalization, or during recovery from certain medical conditions like a stroke. °· People who do not engage in much physical activity (sedentary lifestyle). °· People who have chronic breathing disorders. °· People who have a personal or family history of blood clots or blood clotting disease. °· People who have peripheral vascular disease (PVD), diabetes, or some types of cancer. °· People who have heart disease, especially if the person had a recent heart attack or has congestive heart failure. °· People who have neurological diseases that affect the legs (leg paresis). °· People who have had a traumatic injury, such as breaking a hip or leg. °· People who have recently had major or lengthy surgery, especially on the hip, knee, or abdomen. °· People who have had a central line placed inside a large vein. °· People  who take medicines that contain the hormone estrogen. These include birth control pills and hormone replacement therapy. °· Pregnancy or during childbirth or the postpartum period. °· Long plane flights (over 8 hours). °SIGNS AND SYMPTOMS °Symptoms of a DVT can include:  °· Swelling of your leg or arm, especially if one side is much worse. °· Warmth and redness of your leg or arm, especially if one side is much worse. °· Pain in your arm or leg. If the clot is in your leg, symptoms may be more noticeable or worse when you stand or walk. °· A feeling of pins and needles, if the clot is in the arm. °The symptoms of a DVT that has traveled to the lungs (pulmonary embolism, PE) usually start suddenly and include: °· Shortness of breath while active or at rest. °· Coughing or coughing up blood or blood-tinged mucus. °· Chest pain that is often worse with deep breaths. °· Rapid or irregular heartbeat. °· Feeling light-headed or dizzy. °· Fainting. °· Feeling anxious. °· Sweating. °There may also be pain and swelling in a leg if that is where the blood clot started. °These symptoms may represent a serious problem that is an emergency. Do not wait to see if the symptoms will go away. Get medical help right away. Call your local emergency services (911 in the [place] Do not drive yourself to the hospital. °DIAGNOSIS °Your health care provider will take a medical history and perform a physical exam. You may also   have other tests, including: °· Blood tests to assess the clotting properties of your blood. °· Imaging tests, such as CT, ultrasound, MRI, X-ray, and other tests to see if you have clots anywhere in your body. °TREATMENT °After a DVT is identified, it can be treated. The type of treatment that you receive depends on many factors, such as the cause of your DVT, your risk for bleeding or developing more clots, and other medical conditions that you have. Sometimes, a combination of treatments is necessary. Treatment  options may be combined and include: °· Monitoring the blood clot with ultrasound. °· Taking medicines by mouth, such as newer blood thinners (anticoagulants), thrombolytics, or warfarin. °· Taking anticoagulant medicine by injection or through an IV tube. °· Wearing compression stockings or using different types of devices. °· Surgery (rare) to remove the blood clot or to place a filter in your abdomen to stop the blood clot from traveling to your lungs. °Treatments for a DVT are often divided into immediate treatment and long-term treatment (up to 3 months after DVT). You can work with your health care provider to choose the treatment program that is best for you. °HOME CARE INSTRUCTIONS °If you are taking a newer oral anticoagulant: °· Take the medicine every single day at the same time each day. °· Understand what foods and drugs interact with this medicine. °· Understand that there are no regular blood tests required when using this medicine. °· Understand the side effects of this medicine, including excessive bruising or bleeding. Ask your health care provider or pharmacist about other possible side effects. °If you are taking warfarin: °· Understand how to take warfarin and know which foods can affect how warfarin works in your body. °· Understand that it is dangerous to take too much or too little warfarin. Too much warfarin increases the risk of bleeding. Too little warfarin continues to allow the risk for blood clots. °· Follow your PT and INR blood testing schedule. The PT and INR results allow your health care provider to adjust your dose of warfarin. It is very important that you have your PT and INR tested as often as told by your health care provider. °· Avoid major changes in your diet, or tell your health care provider before you change your diet. Arrange a visit with a registered dietitian to answer your questions. Many foods, especially foods that are high in vitamin K, can interfere with warfarin  and affect the PT and INR results. Eat a consistent amount of foods that are high in vitamin K, such as: °¨ Spinach, kale, broccoli, cabbage, collard greens, turnip greens, Brussels sprouts, peas, cauliflower, seaweed, and parsley. °¨ Beef liver and pork liver. °¨ Green tea. °¨ Soybean oil. °· Tell your health care provider about any and all medicines, vitamins, and supplements that you take, including aspirin and other over-the-counter anti-inflammatory medicines. Be especially cautious with aspirin and anti-inflammatory medicines. Do not take those before you ask your health care provider if it is safe to do so. This is important because many medicines can interfere with warfarin and affect the PT and INR results. °· Do not start or stop taking any over-the-counter or prescription medicine unless your health care provider or pharmacist tells you to do so. °If you take warfarin, you will also need to do these things: °· Hold pressure over cuts for longer than usual. °· Tell your dentist and other health care providers that you are taking warfarin before you have any procedures in which   bleeding may occur. °· Avoid alcohol or drink very small amounts. Tell your health care provider if you change your alcohol intake. °· Do not use tobacco products, including cigarettes, chewing tobacco, and e-cigarettes. If you need help quitting, ask your health care provider. °· Avoid contact sports. °General Instructions °· Take over-the-counter and prescription medicines only as told by your health care provider. Anticoagulant medicines can have side effects, including easy bruising and difficulty stopping bleeding. If you are prescribed an anticoagulant, you will also need to do these things: °¨ Hold pressure over cuts for longer than usual. °¨ Tell your dentist and other health care providers that you are taking anticoagulants before you have any procedures in which bleeding may occur. °¨ Avoid contact sports. °· Wear a medical  alert bracelet or carry a medical alert card that says you have had a PE. °· Ask your health care provider how soon you can go back to your normal activities. Stay active to prevent new blood clots from forming. °· Make sure to exercise while traveling or when you have been sitting or standing for a long period of time. It is very important to exercise. Exercise your legs by walking or by tightening and relaxing your leg muscles often. Take frequent walks. °· Wear compression stockings as told by your health care provider to help prevent more blood clots from forming. °· Do not use tobacco products, including cigarettes, chewing tobacco, and e-cigarettes. If you need help quitting, ask your health care provider. °· Keep all follow-up appointments with your health care provider. This is important. °PREVENTION °Take these actions to decrease your risk of developing another DVT: °· Exercise regularly. For at least 30 minutes every day, engage in: °¨ Activity that involves moving your arms and legs. °¨ Activity that encourages good blood flow through your body by increasing your heart rate. °· Exercise your arms and legs every hour during long-distance travel (over 4 hours). Drink plenty of water and avoid drinking alcohol while traveling. °· Avoid sitting or lying in bed for long periods of time without moving your legs. °· Maintain a weight that is appropriate for your height. Ask your health care provider what weight is healthy for you. °· If you are a woman who is over 35 years of age, avoid unnecessary use of medicines that contain estrogen. These include birth control pills. °· Do not smoke, especially if you take estrogen medicines. If you need help quitting, ask your health care provider. °If you are hospitalized, prevention measures may include: °· Early walking after surgery, as soon as your health care provider says that it is safe. °· Receiving anticoagulants to prevent blood clots. If you cannot take  anticoagulants, other options may be available, such as wearing compression stockings or using different types of devices. °SEEK IMMEDIATE MEDICAL CARE IF: °· You have new or increased pain, swelling, or redness in an arm or leg. °· You have numbness or tingling in an arm or leg. °· You have shortness of breath while active or at rest. °· You have chest pain. °· You have a rapid or irregular heartbeat. °· You feel light-headed or dizzy. °· You cough up blood. °· You notice blood in your vomit, bowel movement, or urine. °These symptoms may represent a serious problem that is an emergency. Do not wait to see if the symptoms will go away. Get medical help right away. Call your local emergency services (911 in the [place] Do not drive yourself to the hospital. °  °  This information is not intended to replace advice given to you by your health care provider. Make sure you discuss any questions you have with your health care provider.   Document Released: 05/06/2005 Document Revised: 01/25/2015 Document Reviewed: 08/31/2014 Elsevier Interactive Patient Education [employer]     Information on my medicine - Coumadin   (Warfarin)  This medication education was reviewed with me or my healthcare representative as part of my discharge preparation.  The pharmacist that spoke with me during my hospital stay was:  Hatim Homann, [facility]  Why was Coumadin prescribed for you? Coumadin was prescribed for you because you have a blood clot or a medical condition that can cause an increased risk of forming blood clots. Blood clots can cause serious health problems by blocking the flow of blood to the heart, lung, or brain. Coumadin can prevent harmful blood clots from forming. As a reminder your indication for Coumadin is:   Deep Vein Thrombosis Treatment  What test will check on my response to Coumadin? While on Coumadin (warfarin) you will need to have an INR test regularly to ensure that your dose is keeping  you in the desired range. The INR (international normalized ratio) number is calculated from the result of the laboratory test called prothrombin time (PT).  If an INR APPOINTMENT HAS NOT ALREADY BEEN MADE FOR YOU please schedule an appointment to have this lab work done by your health care provider within 7 days. Your INR goal is usually a number between:  2 to 3 or your provider may give you a more narrow range like 2-2.5.  Ask your health care provider during an office visit what your goal INR is.  What  do you need to  know  About  COUMADIN? Take Coumadin (warfarin) exactly as prescribed by your healthcare provider about the same time each day.  DO NOT stop taking without talking to the doctor who prescribed the medication.  Stopping without other blood clot prevention medication to take the place of Coumadin may increase your risk of developing a new clot or stroke.  Get refills before you run out.  What do you do if you miss a dose? If you miss a dose, take it as soon as you remember on the same day then continue your regularly scheduled regimen the next day.  Do not take two doses of Coumadin at the same time.  Important Safety Information A possible side effect of Coumadin (Warfarin) is an increased risk of bleeding. You should call your healthcare provider right away if you experience any of the following: ? Bleeding from an injury or your nose that does not stop. ? Unusual colored urine (red or dark brown) or unusual colored stools (red or black). ? Unusual bruising for unknown reasons. ? A serious fall or if you hit your head (even if there is no bleeding).  Some foods or medicines interact with Coumadin (warfarin) and might alter your response to warfarin. To help avoid this: ? Eat a balanced diet, maintaining a consistent amount of Vitamin K. ? Notify your provider about major diet changes you plan to make. ? Avoid alcohol or limit your intake to 1 drink for women and 2 drinks for  men per day. (1 drink is 5 oz. wine, 12 oz. beer, or 1.5 oz. liquor.)  Make sure that ANY health care provider who prescribes medication for you knows that you are taking Coumadin (warfarin).  Also make sure the healthcare provider who is monitoring  your Coumadin knows when you have started a new medication including herbals and non-prescription products.  Coumadin (Warfarin)  Major Drug Interactions  Increased Warfarin Effect Decreased Warfarin Effect  Alcohol (large quantities) Antibiotics (esp. Septra/Bactrim, Flagyl, Cipro) Amiodarone (Cordarone) Aspirin (ASA) Cimetidine (Tagamet) Megestrol (Megace) NSAIDs (ibuprofen, naproxen, etc.) Piroxicam (Feldene) Propafenone (Rythmol SR) Propranolol (Inderal) Isoniazid (INH) Posaconazole (Noxafil) Barbiturates (Phenobarbital) Carbamazepine (Tegretol) Chlordiazepoxide (Librium) Cholestyramine (Questran) Griseofulvin Oral Contraceptives Rifampin Sucralfate (Carafate) Vitamin K   Coumadin (Warfarin) Major Herbal Interactions  Increased Warfarin Effect Decreased Warfarin Effect  Garlic Ginseng Ginkgo biloba Coenzyme Q10 Green tea St. Johns wort    Coumadin (Warfarin) FOOD Interactions  Eat a consistent number of servings per week of foods HIGH in Vitamin K (1 serving =  cup)  Collards (cooked, or boiled & drained) Kale (cooked, or boiled & drained) Mustard greens (cooked, or boiled & drained) Parsley *serving size only =  cup Spinach (cooked, or boiled & drained) Swiss chard (cooked, or boiled & drained) Turnip greens (cooked, or boiled & drained)  Eat a consistent number of servings per week of foods MEDIUM-HIGH in Vitamin K (1 serving = 1 cup)  Asparagus (cooked, or boiled & drained) Broccoli (cooked, boiled & drained, or raw & chopped) Brussel sprouts (cooked, or boiled & drained) *serving size only =  cup Lettuce, raw (green leaf, endive, romaine) Spinach, raw Turnip greens, raw & chopped   These websites have  more information on Coumadin (warfarin):  http://www.king-[place].com/www.coumadin.com; https://www.hines.net/www.ahrq.gov/consumer/coumadin.htm;

## 2015-04-22 DIAGNOSIS — Z515 Encounter for palliative care: Secondary | ICD-10-CM | POA: Insufficient documentation

## 2015-04-22 DIAGNOSIS — Z66 Do not resuscitate: Secondary | ICD-10-CM | POA: Insufficient documentation

## 2015-04-22 DIAGNOSIS — I82402 Acute embolism and thrombosis of unspecified deep veins of left lower extremity: Principal | ICD-10-CM

## 2015-04-22 LAB — CBC WITH DIFFERENTIAL/PLATELET
BASOS ABS: 0 10*3/uL (ref 0.0–0.1)
Basophils Relative: 1 %
Eosinophils Absolute: 0.2 10*3/uL (ref 0.0–0.7)
Eosinophils Relative: 3 %
HEMATOCRIT: 32.5 % — AB (ref 36.0–46.0)
HEMOGLOBIN: 10.4 g/dL — AB (ref 12.0–15.0)
LYMPHS PCT: 23 %
Lymphs Abs: 1.1 10*3/uL (ref 0.7–4.0)
MCH: 28.2 pg (ref 26.0–34.0)
MCHC: 32 g/dL (ref 30.0–36.0)
MCV: 88.1 fL (ref 78.0–100.0)
MONO ABS: 0.7 10*3/uL (ref 0.1–1.0)
Monocytes Relative: 15 %
NEUTROS PCT: 58 %
Neutro Abs: 2.9 10*3/uL (ref 1.7–7.7)
Platelets: 292 10*3/uL (ref 150–400)
RBC: 3.69 MIL/uL — AB (ref 3.87–5.11)
RDW: 14.9 % (ref 11.5–15.5)
WBC: 5 10*3/uL (ref 4.0–10.5)

## 2015-04-22 LAB — COMPREHENSIVE METABOLIC PANEL
ALT: 13 U/L — AB (ref 14–54)
AST: 16 U/L (ref 15–41)
Albumin: 2.7 g/dL — ABNORMAL LOW (ref 3.5–5.0)
Alkaline Phosphatase: 57 U/L (ref 38–126)
Anion gap: 5 (ref 5–15)
BILIRUBIN TOTAL: 0.5 mg/dL (ref 0.3–1.2)
BUN: 16 mg/dL (ref 6–20)
CO2: 28 mmol/L (ref 22–32)
CREATININE: 1.31 mg/dL — AB (ref 0.44–1.00)
Calcium: 8.8 mg/dL — ABNORMAL LOW (ref 8.9–10.3)
Chloride: 108 mmol/L (ref 101–111)
GFR calc Af Amer: 38 mL/min — ABNORMAL LOW (ref >60)
GFR, EST NON AFRICAN AMERICAN: 33 mL/min — AB (ref >60)
Glucose, Bld: 95 mg/dL (ref 65–99)
Potassium: 4.6 mmol/L (ref 3.5–5.1)
Sodium: 141 mmol/L (ref 135–145)
TOTAL PROTEIN: 5.6 g/dL — AB (ref 6.5–8.1)

## 2015-04-22 LAB — PROTIME-INR
INR: 1.26 (ref 0.00–1.49)
PROTHROMBIN TIME: 15.9 s — AB (ref 11.6–15.2)

## 2015-04-22 MED ORDER — WARFARIN SODIUM 5 MG PO TABS
5.0000 mg | ORAL_TABLET | Freq: Once | ORAL | Status: AC
Start: 2015-04-22 — End: 2015-04-22
  Administered 2015-04-22: 5 mg via ORAL
  Filled 2015-04-22: qty 1

## 2015-04-22 NOTE — Progress Notes (Addendum)
Pt seen and examined at the bedside. Needs to have therapeutic INR.  Needs to be sitter free for 24 hours. Please refer to discharge summary completed 04/21/2015.  Sherri Passeylma Nasira [facility] [phone]

## 2015-04-22 NOTE — Progress Notes (Addendum)
ANTICOAGULATION CONSULT NOTE - Follow Up Consult  Pharmacy Consult for Warfarin Indication: new DVT  No Known Allergies  Patient Measurements: Height: 5\' 4"  (162.6 cm) Weight: 114 lb 3.2 oz (51.8 kg) IBW/kg (Calculated) : 54.7  Vital Signs: Temp: 98.4 F (36.9 C) (12/03 0458) Temp Source: Axillary (12/03 0458) BP: 139/46 mmHg (12/03 0458) Pulse Rate: 57 (12/03 0458)  Labs:  Recent Labs  04/19/15 2113 04/20/15 0442 04/21/15 0500 04/22/15 0500  HGB 10.0* 9.6* 10.9* 10.4*  HCT 31.6* 30.1* 34.3* 32.5*  PLT 275 293 314 292  APTT 32  --   --   --   LABPROT 15.0  --  14.6 15.9*  INR 1.16  --  1.12 1.26  CREATININE 1.32* 1.27* 1.36* 1.31*    Estimated Creatinine Clearance: 20.5 mL/min (by C-G formula based on Cr of 1.31).   Assessment: [age] y/oF with advanced dementia presents from SNF with acute VTE diagnosed at facility. Original plan was to discharge back to facility but due to inability of facility to give injection, patient admitted for bridge therapy. Pharmacy asked to dose warfarin for acute VTE  Today, 04/22/2015:  INR sub-therapeutic but trending up-- 1.26 today  CBC stable  No bleeding issues documented  Heart healthy diet  No major DDIs   CKD-- Crcl <30  Goal of Therapy:  INR 2-3 Monitor platelets by anticoagulation protocol: Yes   Plan:  Day 3 of minimum 5-day overlap for acute VTE  Warfarin 5 mg PO x1 today  Continue Enoxaparin 50 mg SQ q24h (dose appr for renal function) for a minimum of 5 day overlap and until INR =/> 2 for > 24 hours.  Daily PT/INR.  CBC at least q72h while in the hospital.  Monitor for s/s of bleeding.  Educated patient's son on warfarin on 12/016.  Dorna LeitzAnh Kaleeyah Cuffie, PharmD, BCPS 04/22/2015 8:17 AM

## 2015-04-22 NOTE — Progress Notes (Signed)
Initial neurologic assessment revealed the patient to be somnolent, with prolonged and sustained sleep periods.  Patient arouses briefly to verbal stimuli, then lapses back to sleep immediately after removal of stimuli. Patient demonstrated deficits in attention span, verbal fluency and retrieval upon arousal. All scheduled HS oral medications were held due to increased somnolence and sedation.

## 2015-04-22 NOTE — Plan of Care (Signed)
Problem: Education: Goal: Knowledge of [place] [facility] Education information/materials will improve Outcome: Not Progressing Sleep periods were prolonged and sustained overnight, punctuated by brief periods of wakefulness. Patient easily arouses to verbal stimuli, but drifts back to sleep soon after removal of stimuli. Patient is predominantly non-communicative, which hinders testing for cognitive function. Level of orientation difficult to ascertain secondary to patient's lack of verbal response during questioning. Patient displayed diminished attention and response to verbal communications.   Problem: Safety: Goal: Ability to remain free from injury will improve Outcome: Progressing Falls prevention protocol maintained. Patient exhibited decreased level of consciousness. Patient remained asleep majority of the time, awakening briefly to verbal stimuli. Sleep resumption was immediate after removal of stimuli. Capacity for compliance to falls prevention protocol and safety measures is hampered by current level of cognition.

## 2015-04-22 NOTE — Plan of Care (Deleted)
Problem: Education: Goal: Knowledge of [place] [facility] Education information/materials will improve Outcome: Progressing Sleep periods were prolonged and sustained overnight, punctuated by brief periods of wakefulness. Patient easily arouses to verbal stimuli, but drifts back to sleep soon after removal of stimuli. Patient was predominantly noncommunicative, exhibiting diminished capacity for verbal exchange.

## 2015-04-23 DIAGNOSIS — J449 Chronic obstructive pulmonary disease, unspecified: Secondary | ICD-10-CM

## 2015-04-23 DIAGNOSIS — E440 Moderate protein-calorie malnutrition: Secondary | ICD-10-CM

## 2015-04-23 DIAGNOSIS — N179 Acute kidney failure, unspecified: Secondary | ICD-10-CM

## 2015-04-23 DIAGNOSIS — I10 Essential (primary) hypertension: Secondary | ICD-10-CM

## 2015-04-23 DIAGNOSIS — N183 Chronic kidney disease, stage 3 (moderate): Secondary | ICD-10-CM

## 2015-04-23 LAB — PROTIME-INR
INR: 1.32 (ref 0.00–1.49)
Prothrombin Time: 16.5 seconds — ABNORMAL HIGH (ref 11.6–15.2)

## 2015-04-23 LAB — COMPREHENSIVE METABOLIC PANEL
ALBUMIN: 2.8 g/dL — AB (ref 3.5–5.0)
ALK PHOS: 54 U/L (ref 38–126)
ALT: 16 U/L (ref 14–54)
ANION GAP: 7 (ref 5–15)
AST: 21 U/L (ref 15–41)
BILIRUBIN TOTAL: 0.4 mg/dL (ref 0.3–1.2)
BUN: 27 mg/dL — AB (ref 6–20)
CALCIUM: 8.7 mg/dL — AB (ref 8.9–10.3)
CO2: 27 mmol/L (ref 22–32)
Chloride: 106 mmol/L (ref 101–111)
Creatinine, Ser: 1.6 mg/dL — ABNORMAL HIGH (ref 0.44–1.00)
GFR calc Af Amer: 30 mL/min — ABNORMAL LOW (ref >60)
GFR calc non Af Amer: 26 mL/min — ABNORMAL LOW (ref >60)
GLUCOSE: 104 mg/dL — AB (ref 65–99)
Potassium: 4.6 mmol/L (ref 3.5–5.1)
Sodium: 140 mmol/L (ref 135–145)
TOTAL PROTEIN: 5.6 g/dL — AB (ref 6.5–8.1)

## 2015-04-23 MED ORDER — WARFARIN SODIUM 5 MG PO TABS
5.0000 mg | ORAL_TABLET | Freq: Once | ORAL | Status: AC
  Administered 2015-04-23: 5 mg via ORAL
  Filled 2015-04-23: qty 1

## 2015-04-23 NOTE — Progress Notes (Addendum)
Patient ID: [name], female   DOB: 1916/10/06, 81 y.o.   MRN: [id number] [facility] PROGRESS NOTE  [name] [id number]:[id number] DOB: 17-Aug-1916 DOA: 04/19/2015 PCP: Ron Parker, MD  Brief narrative:    81 y.o. female with a past medical history of Alzheimer's dementia, hypertension, COPD, anxiety, depression, GERD who was sent to the emergency department from [facility] [place], the assisted living facility where she resides, after being diagnosed with a left lower extremity DVT by venous duplex.  Barrier to discharge is subtherapeutic INR and facility cannot accept the pt on Lovenox and she has to be bridged with INR 2-3 prior to discharge.   Anticipated discharge: to SNF once INR therapeutic.   Assessment/Plan:     DVT, lower extremity, left - Lovenox and coumadin per pharmacy - INR 1.32   Acute on chronic kidney disease, stage III - Cr elevated at 1.60. Baseline Cr 1.7 about 4 months ago so her current Cr within baseline values.   Alzheimer's dementia - Continue current medications. - Stable - Continue aricept, celexa and risperidone - Continue Remeron  - Palliative care consulted for goals of care    COPD (chronic obstructive pulmonary disease) (HCC) - Stable, not in exacerbation - continue BD's as needed    HTN (hypertension), essential  - Continue Bumex and hydralazine.    Protein calorie malnutrition, moderate - Continue nutritional supplementation     DVT Prophylaxis  - On AC with Lovenox and coumadin until INR therapeutic and then will be on Coumadin only    Code Status: DNR/DNI Family Communication:  plan of care discussed with the patient Disposition Plan: To SNF once INR therapeutic   IV access:  Peripheral IV  Procedures and diagnostic studies:    No results found.  Medical Consultants:  Palliative care  Other Consultants:  PT   IAnti-Infectives:   None    Manson Passey, MD  [facility] Pager [phone]  Time  spent in minutes: 15 minutes  If 7PM-7AM, please contact night-coverage [email or web address] Password [email or web address] 04/23/2015, 10:27 AM   LOS: 4 days    HPI/Subjective: No acute overnight events. No respiratory distress.   Objective: Filed Vitals:   04/22/15 1419 04/22/15 2025 04/22/15 2100 04/23/15 0708  BP: 114/92 127/102 130/58 153/55  Pulse: 61 72  56  Temp: 98.4 F (36.9 C) 97.8 F (36.6 C)  98.6 F (37 C)  TempSrc: Oral Oral  Oral  Resp: Height:      Weight:      SpO2: 100% 97%  98%    Intake/Output Summary (Last 24 hours) at 04/23/15 1027 Last data filed at 04/23/15 0709  Gross per 24 hour  Intake    540 ml  Output      0 ml  Net    540 ml    Exam:   General:  Pt is not in acute distress  Cardiovascular: Regular rate and rhythm, S1/S2 (+)  Respiratory: Clear to auscultation bilaterally, no wheezing, no crackles, no rhonchi  Abdomen: Soft, non tender, non distended, bowel sounds present  Extremities: No edema, pulses DP and PT palpable bilaterally  Neuro: Grossly nonfocal  Data Reviewed: Basic Metabolic Panel:  Recent Labs Lab 04/19/15 2113 04/20/15 0442 04/21/15 0500 04/22/15 0500 04/23/15 0427  NA 140 140 139 141 140  K 4.5 4.3 3.7 4.6 4.6  CL 108 107 103 108 106  CO2 GLUCOSE 100* 81 88 95 104*  BUN 16  27*  CREATININE 1.32* 1.27* 1.36* 1.31* 1.60*  CALCIUM 8.6* 8.5* 9.1 8.8* 8.7*  MG 2.2  --   --   --   --   PHOS 3.8  --   --   --   --    Liver Function Tests:  Recent Labs Lab 04/19/15 2113 04/20/15 0442 04/21/15 0500 04/22/15 0500 04/23/15 0427  AST 17 14* 20 16 21   ALT 10* 10* 12* 13* 16  ALKPHOS 61 60 68 57 54  BILITOT 0.7 0.3 0.5 0.5 0.4  PROT 5.6* 5.4* 6.5 5.6* 5.6*  ALBUMIN 2.8* 2.5* 3.1* 2.7* 2.8*   No results for input(s): LIPASE, AMYLASE in the last 168 hours. No results for input(s): AMMONIA in the last 168 hours. CBC:  Recent Labs Lab 04/19/15 2113 04/20/15 0442 04/21/15 0500  04/22/15 0500  WBC 9.9 6.4 5.3 5.0  NEUTROABS 7.6 4.3 3.4 2.9  HGB 10.0* 9.6* 10.9* 10.4*  HCT 31.6* 30.1* 34.3* 32.5*  MCV 85.9 87.8 88.2 88.1  PLT 275 293 314 292   Cardiac Enzymes: No results for input(s): CKTOTAL, CKMB, CKMBINDEX, TROPONINI in the last 168 hours. BNP: Invalid input(s): POCBNP CBG: No results for input(s): GLUCAP in the last 168 hours.  No results found for this or any previous visit (from the past 240 hour(s)).   Scheduled Meds: . bumetanide  0.5 mg Oral On Mon Thu  . citalopram  10 mg Oral Daily  . donepezil  10 mg Oral QHS  . enoxaparin (LOVENOX)   50 mg Subcutaneous Q24H  . feeding supplement   237 mL Oral BID BM  . hydrALAZINE  10 mg Oral BID  . hydrocerin  1 application Topical BID  . ketotifen  1 drop Both Eyes BID  . mirtazapine  30 mg Oral QHS  . pantoprazole  40 mg Oral Daily  . potassium chloride  10 mEq Oral Daily  . risperiDONE  0.25 mg Oral Daily  . risperiDONE  0.5 mg Oral QHS  . Vitamin D   50,000 Units Oral Q Thu  . warfarin  5 mg Oral ONCE-1800

## 2015-04-23 NOTE — Progress Notes (Signed)
ANTICOAGULATION CONSULT NOTE - Follow Up Consult  Pharmacy Consult for Warfarin Indication: new DVT  No Known Allergies  Patient Measurements: Height: 5\' 4"  (162.6 cm) Weight: 114 lb 3.2 oz (51.8 kg) IBW/kg (Calculated) : 54.7  Vital Signs: Temp: 98.6 F (37 C) (12/04 0708) Temp Source: Oral (12/04 0708) BP: 153/55 mmHg (12/04 0708) Pulse Rate: 56 (12/04 0708)  Labs:  Recent Labs  04/21/15 0500 04/22/15 0500 04/23/15 0427  HGB 10.9* 10.4*  --   HCT 34.3* 32.5*  --   PLT 314 292  --   LABPROT 14.6 15.9* 16.5*  INR 1.12 1.26 1.32  CREATININE 1.36* 1.31* 1.60*    Estimated Creatinine Clearance: 16.8 mL/min (by C-G formula based on Cr of 1.6).   Assessment: [age] y/oF with advanced dementia presents from SNF with acute VTE diagnosed at facility. Original plan was to discharge back to facility but due to inability of facility to give injection, patient admitted for bridge therapy. Pharmacy asked to dose warfarin for acute VTE  Today, 04/23/2015:  INR sub-therapeutic but trending up-- 1.32 today  CBC stable  No bleeding issues documented  Heart healthy diet  No major DDIs   CKD, scr up 1.60-- Crcl <30  Goal of Therapy:  INR 2-3 Monitor platelets by anticoagulation protocol: Yes   Plan:  Day 4 of minimum 5-day overlap for acute VTE  Repeat Warfarin 5 mg PO x1 today  Continue Enoxaparin 50 mg SQ q24h (dose appr for renal function) for a minimum of 5 day overlap and until INR =/> 2 for > 24 hours.  Daily PT/INR.  CBC q72h while on full dose lovenox  Monitor for s/s of bleeding.  Educated patient's son on warfarin on 12/016.  Dorna LeitzAnh Yahayra Geis, PharmD, BCPS 04/23/2015 7:59 AM

## 2015-04-24 LAB — CBC
HEMATOCRIT: 41.4 % (ref 36.0–46.0)
Hemoglobin: 13.2 g/dL (ref 12.0–15.0)
MCH: 27.9 pg (ref 26.0–34.0)
MCHC: 31.9 g/dL (ref 30.0–36.0)
MCV: 87.5 fL (ref 78.0–100.0)
PLATELETS: 337 10*3/uL (ref 150–400)
RBC: 4.73 MIL/uL (ref 3.87–5.11)
RDW: 14.8 % (ref 11.5–15.5)
WBC: 4.1 10*3/uL (ref 4.0–10.5)

## 2015-04-24 LAB — PROTIME-INR
INR: 1.34 (ref 0.00–1.49)
PROTHROMBIN TIME: 16.7 s — AB (ref 11.6–15.2)

## 2015-04-24 MED ORDER — WARFARIN SODIUM 7.5 MG PO TABS
7.5000 mg | ORAL_TABLET | Freq: Once | ORAL | Status: AC
  Administered 2015-04-24: 7.5 mg via ORAL
  Filled 2015-04-24: qty 1

## 2015-04-24 NOTE — Care Management Note (Signed)
Case Management Note  Patient Details  Name: [name] MRN: [id number] Date of Birth: 03/24/1917  Subjective/Objective:    Admitted with acute DVT                 Action/Plan: Discharge planning per CSW  Expected Discharge Date:   (unknown)               Expected Discharge Plan:  Skilled Nursing Facility  In-House Referral:  Clinical Social Work  Discharge planning Services  CM Consult  Post Acute Care Choice:  NA Choice offered to:  NA  DME Arranged:  N/A DME Agency:  NA  HH Arranged:  NA HH Agency:  NA  Status of Service:  Completed, signed off  Medicare Important Message Given:  Yes Date Medicare IM Given:    Medicare IM give by:    Date Additional Medicare IM Given:    Additional Medicare Important Message give by:     If discussed at Long Length of Stay Meetings, dates discussed:    Additional Comments:  [name], Sherri Jamerson K, RN 04/24/2015, 1:52 PM

## 2015-04-24 NOTE — Progress Notes (Signed)
[place] applied for safety-attempting to bathe pt for incont care.has long nails and is trying to scratch,hit and kick staff.as she screams and yells.bed alarm on all times for high fall risk safety.lab unable to draw blood for lab work at this time. Linward HeadlandBeverly, Yisroel Mullendore D

## 2015-04-24 NOTE — Progress Notes (Addendum)
CSW assisting with d/c planning. [facility] INR still sub-therapeutic but trending up. Memory Care unit will readmit pt once Lovenox is not needed and INR is therapeutic. [name] updated.   Cori RazorJamie Rashad Auld LCSW [phone]  1437: CSW spoke with manager of memory care unit Clydie Braun( Karen ) this afternoon. Clydie BraunKaren will complete an on site assessment tomorrow am.   Cori RazorJamie Elva Breaker LCSW [phone]

## 2015-04-24 NOTE — Progress Notes (Signed)
   04/24/15 1500  Clinical Encounter Type  Visited With Health care provider;Other (Comment);Patient not available (Education officer, museum)  Visit Type Initial  Referral From Chaplain  Consult/Referral To Chaplain  Ch came to visit with pt who was unavailable; [place] met briefly with RN and MSW Case Mgr. Regards to pt and her status; [place] will follow-up as needed when pt awake or family present. 3:34 PM Gwynn Burly

## 2015-04-24 NOTE — Progress Notes (Signed)
Patient ID: Sherri ArnGeraldine Sydney, female   DOB: 12/28/1916, 81 y.o.   MRN: [id number] [facility] PROGRESS NOTE  [name] [id number] DOB: 03/23/1917 DOA: 04/19/2015 PCP: Ron ParkerBOWEN,SAMUEL, MD  Brief narrative:    [age] y.o. female with a past medical history of Alzheimer's dementia, hypertension, COPD, anxiety, depression, GERD who [facility] where she resides, after being diagnosed with a left lower extremity DVT by venous duplex.  Barrier to discharge is subtherapeutic INR and facility cannot accept the pt on Lovenox and she has to be bridged with INR 2-3 prior to discharge.   Anticipated discharge: to SNF once INR therapeutic.   Assessment/Plan:     DVT, lower extremity, left - Lovenox and coumadin per pharmacy - INR 1.34 today   Acute on chronic kidney disease, stage III - Cr elevated at 1.60. Baseline Cr 1.7 about 4 months ago so her current Cr within baseline values.   Alzheimer's dementia - Continue aricept, celexa and risperidone - Continue Remeron  - Palliative care consulted for goals of care    COPD (chronic obstructive pulmonary disease) (HCC) - Stable, not in exacerbation   HTN (hypertension), essential  - Continue Bumex and hydralazine. - BP  148/59    Protein calorie malnutrition, moderate - Continue ensure supplementation      DVT Prophylaxis  - On AC with Lovenox and coumadin until INR therapeutic and then will be on Coumadin only    Code Status: DNR/DNI Family Communication:  Family not at the bedside  Disposition Plan: To SNF once INR therapeutic   IV access:  Peripheral IV  Procedures and diagnostic studies:    No results found.  Medical Consultants:  Palliative care  Other Consultants:  PT   IAnti-Infectives:   None    Manson PasseyEVINE, Emoni Whitworth, MD  [facility] Pager [phone]  Time spent in minutes: 15 minutes  If 7PM-7AM, please contact  night-coverage [email or web address] Password [email or web address] 04/24/2015, 9:23 AM   LOS: 5 days    HPI/Subjective: No acute overnight events. Needs sitter at the bedside.   Objective: Filed Vitals:   04/22/15 2100 04/23/15 0708 04/23/15 1532 04/23/15 2113  BP: 130/58 153/55 149/68 148/59  Pulse:  56 53 66  Temp:  98.6 F (37 C) 98.3 F (36.8 C) 97.2 F (36.2 C)  TempSrc:  Oral Oral Axillary  Resp:  18 16 17   Height:      Weight:      SpO2:  98% 99% 98%    Intake/Output Summary (Last 24 hours) at 04/24/15 [id number] Last data filed at 04/23/15 2300  Gross per 24 hour  Intake    445 ml  Output      0 ml  Net    445 ml    Exam:   General:  Pt not in acute distress  Cardiovascular: RRR, appreciate S1, S2   Respiratory: No wheezing, no crackles, no rhonchi  Abdomen: (+) BS, non tender   Extremities: No edema, pulses palpable bilaterally  Neuro: Nonfocal  Data Reviewed: Basic Metabolic Panel:  Recent Labs Lab 04/19/15 2113 04/20/15 0442 04/21/15 0500 04/22/15 0500 04/23/15 0427  NA 140 140 139 141 140  K 4.5 4.3 3.7 4.6 4.6  CL 108 107 103 108 106  CO2 27 27 28 28 27   GLUCOSE 100* 81 88 95 104*  BUN 20 18 17 16  27*  CREATININE 1.32* 1.27* 1.36* 1.31* 1.60*  CALCIUM 8.6* 8.5* 9.1 8.8* 8.7*  MG 2.2  --   --   --   --  PHOS 3.8  --   --   --   --    Liver Function Tests:  Recent Labs Lab 04/19/15 2113 04/20/15 0442 04/21/15 0500 04/22/15 0500 04/23/15 0427  AST 17 14* ALT 10* 10* 12* 13* 16  ALKPHOS 61 60 68 57 54  BILITOT 0.7 0.3 0.5 0.5 0.4  PROT 5.6* 5.4* 6.5 5.6* 5.6*  ALBUMIN 2.8* 2.5* 3.1* 2.7* 2.8*   No results for input(s): LIPASE, AMYLASE in the last 168 hours. No results for input(s): AMMONIA in the last 168 hours. CBC:  Recent Labs Lab 04/19/15 2113 04/20/15 0442 04/21/15 0500 04/22/15 0500 04/24/15 0841  WBC 9.9 6.4 5.3 5.0 4.1  NEUTROABS 7.6 4.3 3.4 2.9  --   HGB 10.0* 9.6* 10.9* 10.4* 13.2  HCT 31.6* 30.1* 34.3* 32.5* 41.4   MCV 85.9 87.8 88.2 88.1 87.5  PLT 275 293 314 292 337   Cardiac Enzymes: No results for input(s): CKTOTAL, CKMB, CKMBINDEX, TROPONINI in the last 168 hours. BNP: Invalid input(s): POCBNP CBG: No results for input(s): GLUCAP in the last 168 hours.  No results found for this or any previous visit (from the past 240 hour(s)).   Scheduled Meds: . bumetanide  0.5 mg Oral On Mon Thu  . citalopram  10 mg Oral Daily  . donepezil  10 mg Oral QHS  . enoxaparin (LOVENOX)   50 mg Subcutaneous Q24H  . feeding supplement   237 mL Oral BID BM  . hydrALAZINE  10 mg Oral BID  . hydrocerin  1 application Topical BID  . ketotifen  1 drop Both Eyes BID  . mirtazapine  30 mg Oral QHS  . pantoprazole  40 mg Oral Daily  . potassium chloride  10 mEq Oral Daily  . risperiDONE  0.25 mg Oral Daily  . risperiDONE  0.5 mg Oral QHS  . Vitamin D   50,000 Units Oral Q Thu  . warfarin  5 mg Oral ONCE-1800

## 2015-04-24 NOTE — Care Management Important Message (Signed)
Important Message  Patient Details  Name: [name] MRN: [id number] Date of Birth: 10/22/1916   Medicare Important Message Given:  Yes    Renie OraHawkins, Sayge Salvato Smith 04/24/2015, 1:23 PMImportant Message  Patient Details  Name: [name] MRN: [id number] Date of Birth: 07/16/1916   Medicare Important Message Given:  Yes    Renie OraHawkins, Shamiya Demeritt Smith 04/24/2015, 1:22 PM

## 2015-04-24 NOTE — Progress Notes (Signed)
ANTICOAGULATION CONSULT NOTE - Follow Up Consult  Pharmacy Consult for Warfarin Indication: new DVT  No Known Allergies  Patient Measurements: Height: 5\' 4"  (162.6 cm) Weight: 114 lb 3.2 oz (51.8 kg) IBW/kg (Calculated) : 54.7  Vital Signs:    Labs:  Recent Labs  04/22/15 0500 04/23/15 0427 04/24/15 0841  HGB 10.4*  --  13.2  HCT 32.5*  --  41.4  PLT 292  --  337  LABPROT 15.9* 16.5* 16.7*  INR 1.26 1.32 1.34  CREATININE 1.31* 1.60*  --     Estimated Creatinine Clearance: 16.8 mL/min (by C-G formula based on Cr of 1.6).   Assessment: [age] y/oF with advanced dementia presents from SNF with acute VTE diagnosed at facility. Original plan was to discharge back to facility but due to inability of facility to give injection, patient admitted for bridge therapy. Pharmacy asked to dose warfarin for acute VTE  Today, 04/24/2015:  INR sub-therapeutic but trending up-- 1.34 today  CBC stable  No bleeding issues documented  Heart healthy diet  No major DDIs   CKD, scr up 1.60-- Crcl <30  Goal of Therapy:  INR 2-3 Monitor platelets by anticoagulation protocol: Yes   Plan:  Day 5 of minimum 5-day overlap for acute VTE  Repeat Warfarin 7.5  mg PO x1 today  Continue Enoxaparin 50 mg SQ q24h (dose appr for renal function) for a minimum of 5 day overlap and until INR =/> 2 for > 24 hours.  Daily PT/INR.  CBC q72h while on full dose lovenox  Monitor for s/s of bleeding.  Educated patient's son on warfarin on 04/21/15.  Adalberto ColeNikola Betha Shadix, PharmD, BCPS Pager [phone] 04/24/2015 10:03 AM

## 2015-04-25 LAB — PROTIME-INR
INR: 1.88 — AB (ref 0.00–1.49)
PROTHROMBIN TIME: 21.5 s — AB (ref 11.6–15.2)

## 2015-04-25 MED ORDER — WARFARIN SODIUM 5 MG PO TABS
5.0000 mg | ORAL_TABLET | Freq: Once | ORAL | Status: AC
  Administered 2015-04-25: 5 mg via ORAL
  Filled 2015-04-25: qty 1

## 2015-04-25 NOTE — Progress Notes (Addendum)
Patient ID: [name], female   DOB: 1917/05/19, 81 y.o.   MRN: [id number] [facility] PROGRESS NOTE  [name] [id number]:[id number] DOB: Sep 01, 1916 DOA: 04/19/2015 PCP: Ron Parker, MD  Brief narrative:    81 y.o. female with a past medical history of Alzheimer's dementia, hypertension, COPD, anxiety, depression, GERD who was sent to the emergency department from [facility], the assisted living facility where she resides, after being diagnosed with a left lower extremity DVT by venous duplex.  Barrier to discharge is subtherapeutic INR and facility cannot accept the pt on Lovenox and she has to be bridged with INR 2-3 prior to discharge.   Anticipated discharge: to SNF once INR therapeutic.   Assessment/Plan:     DVT, lower extremity, left - Lovenox and coumadin per pharmacy - INR is pending this am - Unable to switch to xarelto or apixaban because of Cr clearance    Acute on chronic kidney disease, stage III - Cr elevated at 1.60. Baseline Cr 1.7 about 4 months ago so her current Cr within baseline values.   Alzheimer's dementia - Continue aricept, celexa and risperidone - Continue Remeron  - Palliative care has seen the patient in consultation. Appreciate PCT following.    COPD (chronic obstructive pulmonary disease) (HCC) - Stable respiratory status    HTN (hypertension), essential  - We will continue Bumex and hydralazine.    Protein calorie malnutrition, moderate - Continue ensure supplementation  - Diet as tolerated     DVT Prophylaxis  - Continue anticoagulation with Lovenox and Coumadin until INR at therapeutic level   Code Status: DNR/DNI Family Communication:  Family not at the bedside  Disposition Plan: To SNF once INR therapeutic; INR this am is pending   IV access:  Peripheral IV  Procedures and diagnostic studies:    No results found.  Medical Consultants:  Palliative care  Other Consultants:  PT   IAnti-Infectives:    None    Manson Passey, MD  [facility] Pager [phone]  Time spent in minutes: 15 minutes  If 7PM-7AM, please contact night-coverage [email or web address] Password [email or web address] 04/25/2015, 9:28 AM   LOS: 6 days    HPI/Subjective: No acute overnight events.   Objective: Filed Vitals:   04/24/15 1429 04/24/15 1505 04/24/15 2200 04/25/15 0623  BP: 154/53 154/53 145/55 149/67  Pulse: 64  76 65  Temp:   98.8 F (37.1 C) 97.9 F (36.6 C)  TempSrc:   Axillary Axillary  Resp:   16 14  Height:      Weight:      SpO2: 98%  97% 100%    Intake/Output Summary (Last 24 hours) at 04/25/15 0928 Last data filed at 04/24/15 1900  Gross per 24 hour  Intake    320 ml  Output      0 ml  Net    320 ml    Exam:   General:  Pt not in distress  Cardiovascular: Rate controlled, appreciate S1, S2   Respiratory: Bilateral air entry, no wheezing   Abdomen: non tender abdomen, (+) BS  Extremities: No leg swelling, palpable pulses   Neuro: No focal deficits   Data Reviewed: Basic Metabolic Panel:  Recent Labs Lab 04/19/15 2113 04/20/15 0442 04/21/15 0500 04/22/15 0500 04/23/15 0427  NA 140 140 139 141 140  K 4.5 4.3 3.7 4.6 4.6  CL 108 107 103 108 106  CO2 GLUCOSE 100* 81 88 95 104*  BUN 27*  CREATININE 1.32* 1.27* 1.36* 1.31* 1.60*  CALCIUM 8.6* 8.5* 9.1 8.8* 8.7*  MG 2.2  --   --   --   --   PHOS 3.8  --   --   --   --    Liver Function Tests:  Recent Labs Lab 04/19/15 2113 04/20/15 0442 04/21/15 0500 04/22/15 0500 04/23/15 0427  AST 17 14* 20 16 21   ALT 10* 10* 12* 13* 16  ALKPHOS 61 60 68 57 54  BILITOT 0.7 0.3 0.5 0.5 0.4  PROT 5.6* 5.4* 6.5 5.6* 5.6*  ALBUMIN 2.8* 2.5* 3.1* 2.7* 2.8*   No results for input(s): LIPASE, AMYLASE in the last 168 hours. No results for input(s): AMMONIA in the last 168 hours. CBC:  Recent Labs Lab 04/19/15 2113 04/20/15 0442 04/21/15 0500 04/22/15 0500 04/24/15 0841  WBC 9.9 6.4 5.3 5.0 4.1   NEUTROABS 7.6 4.3 3.4 2.9  --   HGB 10.0* 9.6* 10.9* 10.4* 13.2  HCT 31.6* 30.1* 34.3* 32.5* 41.4  MCV 85.9 87.8 88.2 88.1 87.5  PLT 275 293 314 292 337   Cardiac Enzymes: No results for input(s): CKTOTAL, CKMB, CKMBINDEX, TROPONINI in the last 168 hours. BNP: Invalid input(s): POCBNP CBG: No results for input(s): GLUCAP in the last 168 hours.  No results found for this or any previous visit (from the past 240 hour(s)).   Scheduled Meds: . bumetanide  0.5 mg Oral On Mon Thu  . citalopram  10 mg Oral Daily  . donepezil  10 mg Oral QHS  . enoxaparin (LOVENOX)   50 mg Subcutaneous Q24H  . feeding supplement   237 mL Oral BID BM  . hydrALAZINE  10 mg Oral BID  . hydrocerin  1 application Topical BID  . ketotifen  1 drop Both Eyes BID  . mirtazapine  30 mg Oral QHS  . pantoprazole  40 mg Oral Daily  . potassium chloride  10 mEq Oral Daily  . risperiDONE  0.25 mg Oral Daily  . risperiDONE  0.5 mg Oral QHS  . Vitamin D   50,000 Units Oral Q Thu  . warfarin  5 mg Oral ONCE-1800

## 2015-04-25 NOTE — Progress Notes (Signed)
Left message on cell phone to pt son about discharge plan. Left him the number to call me to discuss if any other Q's Manson PasseyAlma Deasha Ramos

## 2015-04-25 NOTE — Progress Notes (Signed)
ANTICOAGULATION CONSULT NOTE - Follow Up Consult  Pharmacy Consult for Warfarin Indication: new DVT  No Known Allergies  Patient Measurements: Height: 5\' 4"  (162.6 cm) Weight: 114 lb 3.2 oz (51.8 kg) IBW/kg (Calculated) : 54.7  Vital Signs: Temp: 97.9 F (36.6 C) (12/06 0623) Temp Source: Axillary (12/06 0623) BP: 149/67 mmHg (12/06 0623) Pulse Rate: 65 (12/06 0623)  Labs:  Recent Labs  04/23/15 0427 04/24/15 0841 04/25/15 0920  HGB  --  13.2  --   HCT  --  41.4  --   PLT  --  337  --   LABPROT 16.5* 16.7* 21.5*  INR 1.32 1.34 1.88*  CREATININE 1.60*  --   --     Estimated Creatinine Clearance: 16.8 mL/min (by C-G formula based on Cr of 1.6).   Assessment: [age] y/oF with advanced dementia presents from SNF with acute VTE diagnosed at facility. Original plan was to discharge back to facility but due to inability of facility to give injection, patient admitted for bridge therapy. Pharmacy asked to dose warfarin for acute VTE  Today, 04/25/2015:  INR sub-therapeutic but increase to 1.88  CBC stable, improving  No bleeding issues documented  Heart healthy diet  No major DDIs   CKD, scr up 1.60-- Crcl <30  Goal of Therapy:  INR 2-3 Monitor platelets by anticoagulation protocol: Yes   Plan:  Pt has completed the 5 day overlap for acute VTE with enoxaparin   Warfarin 5  mg PO x1 today  Continue Enoxaparin 50 mg SQ q24h (dose appr for renal function) for a minimum of 5 day overlap and until INR =/> 2 for > 24 hours.  Daily PT/INR.  Will order SCr to check renal function for enoxaparin dosing  CBC q72h while on full dose Lovenox  Monitor for s/s of bleeding.  Educated patient's son on warfarin on 04/21/15.  Adalberto ColeNikola Tarrin Lebow, PharmD, BCPS Pager [phone] 04/25/2015 11:40 AM

## 2015-04-26 LAB — PROTIME-INR
INR: 2.69 — ABNORMAL HIGH (ref 0.00–1.49)
PROTHROMBIN TIME: 28.2 s — AB (ref 11.6–15.2)

## 2015-04-26 LAB — CREATININE, SERUM
Creatinine, Ser: 1.33 mg/dL — ABNORMAL HIGH (ref 0.44–1.00)
GFR calc non Af Amer: 33 mL/min — ABNORMAL LOW (ref >60)
GFR, EST AFRICAN AMERICAN: 38 mL/min — AB (ref >60)

## 2015-04-26 MED ORDER — WARFARIN SODIUM 2.5 MG PO TABS
2.5000 mg | ORAL_TABLET | Freq: Once | ORAL | Status: DC
  Filled 2015-04-26: qty 1

## 2015-04-26 MED ORDER — WARFARIN SODIUM 2.5 MG PO TABS: 2.5000 mg | ORAL_TABLET | Freq: Once | ORAL | Status: DC

## 2015-04-26 MED ORDER — APIXABAN 2.5 MG PO TABS
2.5000 mg | ORAL_TABLET | Freq: Once | ORAL | Status: DC
  Filled 2015-04-26: qty 1

## 2015-04-26 MED ORDER — APIXABAN 2.5 MG PO TABS: 2.5000 mg | ORAL_TABLET | Freq: Two times a day (BID) | ORAL | Status: AC

## 2015-04-26 NOTE — NC FL2 (Deleted)
[place] MEDICAID FL2 LEVEL OF CARE SCREENING TOOL     IDENTIFICATION  Patient Name: [name] Birthdate: [birth year] Sex: female Admission Date (Current Location): 04/19/2015  [facility] and [place] Number:     Facility and Address:  [facility],  [address] [place] [address]      Provider Number: [id number]  Attending Physician Name and Address:  Alison Murray, MD  Relative Name and Phone Number:       Current Level of Care: Hospital Recommended Level of Care: Memory Care Prior Approval Number:    Date Approved/Denied:   PASRR Number:    Discharge Plan: Other (Comment) [facility] )    Current Diagnoses: Patient Active Problem List   Diagnosis Date Noted  . DNR (do not resuscitate)   . Palliative care encounter   . DVT, lower extremity, left 04/19/2015  . BRBPR (bright red blood per rectum) 12/10/2014  . Acute blood loss anemia   . GI bleed 12/06/2014  . Acute GI bleeding 12/05/2014  . Anxiety 12/05/2014  . Alzheimer's dementia 12/05/2014  . COPD (chronic obstructive pulmonary disease) (HCC) 12/05/2014  . HTN (hypertension) 12/05/2014  . Acute kidney injury (HCC) 12/05/2014    Orientation ACTIVITIES/SOCIAL BLADDER RESPIRATION     (disoriented x 4)  Passive Incontinent Normal  BEHAVIORAL SYMPTOMS/MOOD NEUROLOGICAL BOWEL NUTRITION STATUS  Other (Comment) (Pt can be uncooperative with care.)   Incontinent Diet  PHYSICIAN VISITS COMMUNICATION OF NEEDS Height & Weight Skin    Verbally  (162.6 cm) 114 lbs. Other (Comment) (skin tear left tibia)          AMBULATORY STATUS RESPIRATION     (Pt uses w/c for moblization.) Normal      Personal Care Assistance Level of Assistance  Bathing, Feeding, Dressing Bathing Assistance: Maximum assistance Feeding assistance: Maximum assistance Dressing Assistance: Maximum assistance      Functional Limitations Info                SPECIAL CARE FACTORS FREQUENCY                    Additional Factors Info  Code Status, Psychotropic Code Status Info: DNR             Current Medications (04/26/2015):  This is the current hospital active medication list Current Facility-Administered Medications  Medication Dose Route Frequency Provider Last Rate Last Dose  . acetaminophen (TYLENOL) tablet 500 mg  500 mg Oral Q6H PRN Bobette Mo, MD      . ALPRAZolam Prudy Feeler) tablet 0.25 mg  0.25 mg Oral Q6H PRN Bobette Mo, MD   0.25 mg at 04/25/15 2125  . alum & mag hydroxide-simeth (MAALOX/MYLANTA) 200-200-20 MG/5ML suspension 30 mL  30 mL Oral Q6H PRN Bobette Mo, MD      . bumetanide [facility]) tablet 0.5 mg  0.5 mg Oral Once per day on Mon Thu Bobette Mo, MD   0.5 mg at 04/24/15 1414  . citalopram (CELEXA) tablet 10 mg  10 mg Oral Daily Bobette Mo, MD   10 mg at 04/26/15 1100  . donepezil (ARICEPT) tablet 10 mg  10 mg Oral QHS Bobette Mo, MD   10 mg at 04/25/15 2126  . enoxaparin (LOVENOX) injection 50 mg  50 mg Subcutaneous Q24H Aleda Grana, RPH   50 mg at 04/25/15 2127  . feeding supplement (ENSURE ENLIVE) (ENSURE ENLIVE) liquid 237 mL  237 mL Oral BID BM  Dorothea OgleIskra M Myers, MD   237 mL at 04/26/15 1101  . guaiFENesin (ROBITUSSIN) 100 MG/5ML solution 200 mg  200 mg Oral Q6H PRN Bobette Moavid Manuel Ortiz, MD      . hydrALAZINE (APRESOLINE) tablet 10 mg  10 mg Oral BID Bobette Moavid Manuel Ortiz, MD   10 mg at 04/26/15 1100  . hydrocerin (EUCERIN) cream 1 application  1 application Topical BID Bobette Moavid Manuel Ortiz, MD   1 application at 04/26/15 1117  . ketotifen (ZADITOR) 0.025 % ophthalmic solution 1 drop  1 drop Both Eyes BID Bobette Moavid Manuel Ortiz, MD   1 drop at 04/25/15 2126  . loperamide (IMODIUM) capsule 2 mg  2 mg Oral PRN Bobette Moavid Manuel Ortiz, MD      . magnesium hydroxide (MILK OF MAGNESIA) suspension 30 mL  30 mL Oral Daily PRN Bobette Moavid Manuel Ortiz, MD      . mirtazapine (REMERON) tablet 30 mg  30 mg Oral QHS Bobette Moavid Manuel Ortiz, MD    30 mg at 04/25/15 2126  . pantoprazole (PROTONIX) EC tablet 40 mg  40 mg Oral Daily Bobette Moavid Manuel Ortiz, MD   40 mg at 04/25/15 [id number]  . potassium chloride 20 MEQ/15ML (10%) solution 10 mEq  10 mEq Oral Daily Bobette Moavid Manuel Ortiz, MD   10 mEq at 04/26/15 1059  . risperiDONE (RISPERDAL) tablet 0.25 mg  0.25 mg Oral Daily Bobette Moavid Manuel Ortiz, MD   0.25 mg at 04/25/15 [id number]  . risperiDONE (RISPERDAL) tablet 0.5 mg  0.5 mg Oral QHS Bobette Moavid Manuel Ortiz, MD   0.5 mg at 04/25/15 2126  . Vitamin D (Ergocalciferol) (DRISDOL) capsule 50,000 Units  50,000 Units Oral Q Thu Bobette Moavid Manuel Ortiz, MD   50,000 Units at 04/20/15 [phone]  . warfarin (COUMADIN) tablet 2.5 mg  2.5 mg Oral ONCE-1800 Hessie KnowsJustin M Legge, [facility]      . Warfarin - Pharmacist Dosing Inpatient   Does not apply q1800 Aleda GranaDustin G Zeigler, RPH   0  at 04/20/15 1450     Discharge Medications: TAKE these medications       acetaminophen 500 MG tablet  Commonly known as: TYLENOL  Take 500 mg by mouth every 6 (six) hours as needed for mild pain.     ALPRAZolam 0.25 MG tablet  Commonly known as: XANAX  Take 1 tablet (0.25 mg total) by mouth every 6 (six) hours as needed for anxiety (agitation). Not to exceed 6 tablets in 24 hours     alum & mag hydroxide-simeth 200-200-20 MG/5ML suspension  Commonly known as: MAALOX/MYLANTA  Take 30 mLs by mouth every 6 (six) hours as needed for indigestion or heartburn.     azelastine 0.05 % ophthalmic solution  Commonly known as: OPTIVAR  Place 1 drop into both eyes 2 (two) times daily.     bumetanide 1 MG tablet  Commonly known as: BUMEX  Take 0.5 mg by mouth 2 (two) times a week. Monday and Thursday.     citalopram 10 MG tablet  Commonly known as: CELEXA  Take 10 mg by mouth daily.     donepezil 10 MG tablet  Commonly known as: ARICEPT  Take 10 mg by mouth at bedtime.        ENSURE  Take 237 mLs by mouth 3 (three) times daily with meals.      esomeprazole 20 MG capsule  Commonly known as: NEXIUM  Take 20 mg by mouth daily at 12 noon.     guaifenesin 100 MG/5ML syrup  Commonly known as:  ROBITUSSIN  Take 200 mg by mouth every 6 (six) hours as needed for cough.     hydrALAZINE 10 MG tablet  Commonly known as: APRESOLINE  Take 1 tablet (10 mg total) by mouth 2 (two) times daily.     hydrocerin Crea  Apply 1 application topically 2 (two) times daily.     loperamide 2 MG capsule  Commonly known as: IMODIUM  Take 2 mg by mouth as needed for diarrhea or loose stools (not to exceed 8 doses in 24 hours).     magnesium hydroxide 400 MG/5ML suspension  Commonly known as: MILK OF MAGNESIA  Take 30 mLs by mouth daily as needed for mild constipation.     mirtazapine 30 MG tablet  Commonly known as: REMERON  Take 30 mg by mouth at bedtime.     potassium chloride 20 MEQ/15ML (10%) Soln  Take 10 mEq by mouth daily.     risperiDONE 0.25 MG tablet  Commonly known as: RISPERDAL  Take 1 tablet (0.25 mg total) by mouth daily.     risperiDONE 0.5 MG tablet  Commonly known as: RISPERDAL  Take 1 tablet (0.5 mg total) by mouth at bedtime.     TRIPLE ANTIBIOTIC EX  Apply 1 application topically. Apply after cleaning with normal saline, cover with bandaid or gauze and secure with tape. Change as needed until healed for skin tears, abrasions, or minor irritations.     Vitamin D (Ergocalciferol) 50000 UNITS Caps capsule  Commonly known as: DRISDOL  Take 50,000 Units by mouth every Thursday.     warfarin 2.5 MG tablet  Commonly known as: COUMADIN  Take 1 tablet (2.5 mg total) by mouth one time only at 6 PM.           Follow-up Information    Follow up with [facility], MD. Schedule an appointment as soon as possible for a visit in 3 days.   Specialty: Internal Medicine   Why: Follow up appt after recent hospitalization; check INR                Please see discharge summary for a list of discharge medications.  Relevant Imaging Results:  Relevant Lab Results:  Recent Labs    Additional Information Please continue Hospice Services at facility.  Lew Prout, Dickey Gave, LCSW

## 2015-04-26 NOTE — NC FL2 (Deleted)
[place] MEDICAID FL2 LEVEL OF CARE SCREENING TOOL     IDENTIFICATION  Patient Name: [name] Birthdate: 06-28-1916 Sex: female Admission Date (Current Location): 04/19/2015  [facility] and [place] Number:     Facility and Address:  [facility],  [address], [place] [address]      Provider Number: [id number]  Attending Physician Name and Address:  Alison Murray, MD  Relative Name and Phone Number:       Current Level of Care: Hospital Recommended Level of Care: Memory Care Prior Approval Number:    Date Approved/Denied:   PASRR Number:    Discharge Plan: Other (Comment) [facility] )    Current Diagnoses: Patient Active Problem List   Diagnosis Date Noted  . DNR (do not resuscitate)   . Palliative care encounter   . DVT, lower extremity, left 04/19/2015  . BRBPR (bright red blood per rectum) 12/10/2014  . Acute blood loss anemia   . GI bleed 12/06/2014  . Acute GI bleeding 12/05/2014  . Anxiety 12/05/2014  . Alzheimer's dementia 12/05/2014  . COPD (chronic obstructive pulmonary disease) (HCC) 12/05/2014  . HTN (hypertension) 12/05/2014  . Acute kidney injury (HCC) 12/05/2014    Orientation ACTIVITIES/SOCIAL BLADDER RESPIRATION     (disoriented x 4)  Passive Incontinent Normal  BEHAVIORAL SYMPTOMS/MOOD NEUROLOGICAL BOWEL NUTRITION STATUS  Other (Comment) (Pt can be uncooperative with care.)   Incontinent Diet  PHYSICIAN VISITS COMMUNICATION OF NEEDS Height & Weight Skin    Verbally  (162.6 cm) 114 lbs. Other (Comment) (skin tear left tibia)          AMBULATORY STATUS RESPIRATION     (Pt uses w/c for moblization.) Normal      Personal Care Assistance Level of Assistance  Bathing, Feeding, Dressing Bathing Assistance: Maximum assistance Feeding assistance: Maximum assistance Dressing Assistance: Maximum assistance      Functional Limitations Info                SPECIAL CARE FACTORS FREQUENCY                    Additional Factors Info  Code Status, Psychotropic Code Status Info: DNR             Current Medications (04/26/2015):  This is the current hospital active medication list Current Facility-Administered Medications  Medication Dose Route Frequency Provider Last Rate Last Dose  . acetaminophen (TYLENOL) tablet 500 mg  500 mg Oral Q6H PRN Bobette Mo, MD      . ALPRAZolam Prudy Feeler) tablet 0.25 mg  0.25 mg Oral Q6H PRN Bobette Mo, MD   0.25 mg at 04/25/15 2125  . alum & mag hydroxide-simeth (MAALOX/MYLANTA) 200-200-20 MG/5ML suspension 30 mL  30 mL Oral Q6H PRN Bobette Mo, MD      . bumetanide [facility]) tablet 0.5 mg  0.5 mg Oral Once per day on Mon Thu Bobette Mo, MD   0.5 mg at 04/24/15 1414  . citalopram (CELEXA) tablet 10 mg  10 mg Oral Daily Bobette Mo, MD   10 mg at 04/26/15 1100  . donepezil (ARICEPT) tablet 10 mg  10 mg Oral QHS Bobette Mo, MD   10 mg at 04/25/15 2126  . enoxaparin (LOVENOX) injection 50 mg  50 mg Subcutaneous Q24H Aleda Grana, RPH   50 mg at 04/25/15 2127  . feeding supplement (ENSURE ENLIVE) (ENSURE ENLIVE) liquid 237 mL  237 mL Oral BID BM  Dorothea OgleIskra M Myers, MD   237 mL at 04/26/15 1101  . guaiFENesin (ROBITUSSIN) 100 MG/5ML solution 200 mg  200 mg Oral Q6H PRN Bobette Moavid Manuel Ortiz, MD      . hydrALAZINE (APRESOLINE) tablet 10 mg  10 mg Oral BID Bobette Moavid Manuel Ortiz, MD   10 mg at 04/26/15 1100  . hydrocerin (EUCERIN) cream 1 application  1 application Topical BID Bobette Moavid Manuel Ortiz, MD   1 application at 04/26/15 1117  . ketotifen (ZADITOR) 0.025 % ophthalmic solution 1 drop  1 drop Both Eyes BID Bobette Moavid Manuel Ortiz, MD   1 drop at 04/25/15 2126  . loperamide (IMODIUM) capsule 2 mg  2 mg Oral PRN Bobette Moavid Manuel Ortiz, MD      . magnesium hydroxide (MILK OF MAGNESIA) suspension 30 mL  30 mL Oral Daily PRN Bobette Moavid Manuel Ortiz, MD      . mirtazapine (REMERON) tablet 30 mg  30 mg Oral QHS Bobette Moavid Manuel Ortiz, MD    30 mg at 04/25/15 2126  . pantoprazole (PROTONIX) EC tablet 40 mg  40 mg Oral Daily Bobette Moavid Manuel Ortiz, MD   40 mg at 04/25/15 [id number]  . potassium chloride 20 MEQ/15ML (10%) solution 10 mEq  10 mEq Oral Daily Bobette Moavid Manuel Ortiz, MD   10 mEq at 04/26/15 1059  . risperiDONE (RISPERDAL) tablet 0.25 mg  0.25 mg Oral Daily Bobette Moavid Manuel Ortiz, MD   0.25 mg at 04/25/15 [id number]  . risperiDONE (RISPERDAL) tablet 0.5 mg  0.5 mg Oral QHS Bobette Moavid Manuel Ortiz, MD   0.5 mg at 04/25/15 2126  . Vitamin D (Ergocalciferol) (DRISDOL) capsule 50,000 Units  50,000 Units Oral Q Thu Bobette Moavid Manuel Ortiz, MD   50,000 Units at 04/20/15 [phone]  . warfarin (COUMADIN) tablet 2.5 mg  2.5 mg Oral ONCE-1800 Hessie KnowsJustin M Legge, [facility]      . Warfarin - Pharmacist Dosing Inpatient   Does not apply q1800 Aleda GranaDustin G Zeigler, RPH   0  at 04/20/15 1450     Discharge Medications:      Please see discharge summary for a list of discharge medications.   Relevant Imaging Results:  Relevant Lab Results:  Recent Labs    Additional Information Please continue Hospice Services at facility.  Carlester Kasparek, Dickey GaveJamie Lee, LCSW

## 2015-04-26 NOTE — Discharge Summary (Addendum)
Physician Discharge Summary  [name] WUJ:[id number] DOB: 01/28/1917 DOA: 04/19/2015  PCP: Sherri Parker, MD Admit date: 04/19/2015 Discharge date: 04/26/2015  Recommendations for Outpatient Follow-up:  Discharge to memory care unit  Pt son does not want coumadin. Explained the risk of kidney failure with apixaban. His choice is apixaban which she will continue 2.5 mg BID.  Discharge Diagnoses:  Principal Problem:   DVT, lower extremity, left Active Problems:   Alzheimer's dementia   COPD (chronic obstructive pulmonary disease) (HCC)   HTN (hypertension)   DNR (do not resuscitate)   Palliative care encounter    Discharge Condition: stable   Diet recommendation: mechanical chopped diet    History of present illness:  81 y.o. female with a past medical history of Alzheimer's dementia, hypertension, COPD, anxiety, depression, GERD who was sent to the emergency department from [facility], the assisted living facility where she resides, after being diagnosed with a left lower extremity DVT by venous duplex.   Hospital Course:  DVT, lower extremity, left - Pt son requested apixaban on discharge - He wants to focus on comfort - Knows risk with apixaban   Acute on chronic kidney disease, stage III - Cr elevated at 1.60. Baseline Cr 1.7 about 4 months ago - Cr this am 1.33, still within baseline range    Alzheimer's dementia - Continue aricept, celexa and risperidone - Continue Remeron  - Palliative care has seen the patient in consultation.    COPD (chronic obstructive pulmonary disease) (HCC) - Stable respiratory status    HTN (hypertension), essential  - Continue Bumex and hydralazine.   Protein calorie malnutrition, moderate - Continue ensure supplementation     DVT Prophylaxis  - Continue apixaban   Code Status: DNR/DNI Family Communication: Family not at the bedside; spoke with her son [name] over the phone.   IV access:   Peripheral IV  Procedures and diagnostic studies:   No results found.  Medical Consultants:  Palliative care  Other Consultants:  PT   IAnti-Infectives:   None      Signed:  Manson Passey, MD  [facility] 04/26/2015, 10:14 AM  Pager #: [phone]  Time spent in minutes: less than 30 minutes  Discharge Exam: Filed Vitals:   04/25/15 1422 04/25/15 2147  BP: 150/69 142/68  Pulse: 68 62  Temp: 98.3 F (36.8 C) 98.1 F (36.7 C)  Resp: 15 16   Filed Vitals:   04/24/15 2200 04/25/15 0623 04/25/15 1422 04/25/15 2147  BP: 145/55 149/67 150/69 142/68  Pulse: 76 65 68 62  Temp: 98.8 F (37.1 C) 97.9 F (36.6 C) 98.3 F (36.8 C) 98.1 F (36.7 C)  TempSrc: Axillary Axillary Axillary Axillary  Resp: Height:      Weight:      SpO2: 97% 100% 100% 98%    General: Pt is alert, not in acute distress Cardiovascular: Regular rate and rhythm, S1/S2 + Respiratory: Clear to auscultation bilaterally, no wheezing, no crackles, no rhonchi Abdominal: Soft, non tender, non distended, bowel sounds +, no guarding Extremities: no edema, no cyanosis, pulses palpable bilaterally DP and PT Neuro: Grossly nonfocal  Discharge Instructions  Discharge Instructions    Call MD for:  difficulty breathing, headache or visual disturbances    Complete by:  As directed      Call MD for:  persistant dizziness or light-headedness    Complete by:  As directed      Call MD for:  persistant nausea and vomiting  Complete by:  As directed      Call MD for:  severe uncontrolled pain    Complete by:  As directed      Diet - low sodium heart healthy    Complete by:  As directed      Discharge instructions    Complete by:  As directed   Continue coumadin 2.5 daily  Recheck INR in 3 days: 11/10 and adjust Coumadin dose to reflect range INR 2-3 INR today 04/26/2015 is 2.69     Increase activity slowly    Complete by:  As directed      Increase activity slowly     Complete by:  As directed                Medication List    STOP taking these medications        pantoprazole 40 MG tablet  Commonly known as:  PROTONIX     sulfamethoxazole-trimethoprim 800-160 MG tablet  Commonly known as:  BACTRIM DS,SEPTRA DS      TAKE these medications        acetaminophen 500 MG tablet  Commonly known as:  TYLENOL  Take 500 mg by mouth every 6 (six) hours as needed for mild pain.     ALPRAZolam 0.25 MG tablet  Commonly known as:  XANAX  Take 1 tablet (0.25 mg total) by mouth every 6 (six) hours as needed for anxiety (agitation). Not to exceed 6 tablets in 24 hours     alum & mag hydroxide-simeth 200-200-20 MG/5ML suspension  Commonly known as:  MAALOX/MYLANTA  Take 30 mLs by mouth every 6 (six) hours as needed for indigestion or heartburn.     apixaban 2.5 MG Tabs tablet  Commonly known as:  ELIQUIS  Take 1 tablet (2.5 mg total) by mouth 2 (two) times daily.     azelastine 0.05 % ophthalmic solution  Commonly known as:  OPTIVAR  Place 1 drop into both eyes 2 (two) times daily.     bumetanide 1 MG tablet  Commonly known as:  BUMEX  Take 0.5 mg by mouth 2 (two) times a week. Monday and Thursday.     citalopram 10 MG tablet  Commonly known as:  CELEXA  Take 10 mg by mouth daily.     donepezil 10 MG tablet  Commonly known as:  ARICEPT  Take 10 mg by mouth at bedtime.     ENSURE  Take 237 mLs by mouth 3 (three) times daily with meals.     esomeprazole 20 MG capsule  Commonly known as:  NEXIUM  Take 20 mg by mouth daily at 12 noon.     guaifenesin 100 MG/5ML syrup  Commonly known as:  ROBITUSSIN  Take 200 mg by mouth every 6 (six) hours as needed for cough.     hydrALAZINE 10 MG tablet  Commonly known as:  APRESOLINE  Take 1 tablet (10 mg total) by mouth 2 (two) times daily.     hydrocerin Crea  Apply 1 application topically 2 (two) times daily.     loperamide 2 MG capsule  Commonly known as:  IMODIUM  Take 2 mg by mouth as  needed for diarrhea or loose stools (not to exceed 8 doses in 24 hours).     magnesium hydroxide 400 MG/5ML suspension  Commonly known as:  MILK OF MAGNESIA  Take 30 mLs by mouth daily as needed for mild constipation.     mirtazapine 30 MG tablet  Commonly known  as:  REMERON  Take 30 mg by mouth at bedtime.     potassium chloride 20 MEQ/15ML (10%) Soln  Take 10 mEq by mouth daily.     risperiDONE 0.25 MG tablet  Commonly known as:  RISPERDAL  Take 1 tablet (0.25 mg total) by mouth daily.     risperiDONE 0.5 MG tablet  Commonly known as:  RISPERDAL  Take 1 tablet (0.5 mg total) by mouth at bedtime.     TRIPLE ANTIBIOTIC EX  Apply 1 application topically. Apply after cleaning with normal saline, cover with bandaid or gauze and secure with tape. Change as needed until healed for skin tears, abrasions, or minor irritations.     Vitamin D (Ergocalciferol) 50000 UNITS Caps capsule  Commonly known as:  DRISDOL  Take 50,000 Units by mouth every Thursday.            Follow-up Information    [facility]. Schedule an appointment as soon as possible for a visit in 3 days.   Specialty:  Internal Medicine   Why:  Follow up appt after recent hospitalization; check INR       The results of significant diagnostics from this hospitalization (including imaging, microbiology, ancillary and laboratory) are listed below for reference.    Significant Diagnostic Studies: No results found.  Microbiology: No results found for this or any previous visit (from the past 240 hour(s)).   Labs: Basic Metabolic Panel:  Recent Labs Lab 04/19/15 2113 04/20/15 0442 04/21/15 0500 04/22/15 0500 04/23/15 0427 04/26/15 0450  NA 140 140 139 141 140  --   K 4.5 4.3 3.7 4.6 4.6  --   CL 108 107 103 108 106  --   CO2 27 27 28 28 27   --   GLUCOSE 100* 81 88 95 104*  --   BUN 20 18 17 16  27*  --   CREATININE 1.32* 1.27* 1.36* 1.31* 1.60* 1.33*  CALCIUM 8.6* 8.5* 9.1 8.8* 8.7*   --   MG 2.2  --   --   --   --   --   PHOS 3.8  --   --   --   --   --    Liver Function Tests:  Recent Labs Lab 04/19/15 2113 04/20/15 0442 04/21/15 0500 04/22/15 0500 04/23/15 0427  AST 17 14* 20 16 21   ALT 10* 10* 12* 13* 16  ALKPHOS 61 60 68 57 54  BILITOT 0.7 0.3 0.5 0.5 0.4  PROT 5.6* 5.4* 6.5 5.6* 5.6*  ALBUMIN 2.8* 2.5* 3.1* 2.7* 2.8*   No results for input(s): LIPASE, AMYLASE in the last 168 hours. No results for input(s): AMMONIA in the last 168 hours. CBC:  Recent Labs Lab 04/19/15 2113 04/20/15 0442 04/21/15 0500 04/22/15 0500 04/24/15 0841  WBC 9.9 6.4 5.3 5.0 4.1  NEUTROABS 7.6 4.3 3.4 2.9  --   HGB 10.0* 9.6* 10.9* 10.4* 13.2  HCT 31.6* 30.1* 34.3* 32.5* 41.4  MCV 85.9 87.8 88.2 88.1 87.5  PLT 275 293 314 292 337   Cardiac Enzymes: No results for input(s): CKTOTAL, CKMB, CKMBINDEX, TROPONINI in the last 168 hours. BNP: BNP (last 3 results) No results for input(s): BNP in the last 8760 hours.  ProBNP (last 3 results) No results for input(s): PROBNP in the last 8760 hours.  CBG: No results for input(s): GLUCAP in the last 168 hours.

## 2015-04-26 NOTE — NC FL2 (Signed)
[place] MEDICAID FL2 LEVEL OF CARE SCREENING TOOL     IDENTIFICATION  Patient Name: [name] Birthdate: 10/05/1916 Sex: female Admission Date (Current Location): 04/19/2015  [facility] [place]     Facility and Address:  [facility],  [address], [place] [id number]      Provider Number: [id number]  Attending Physician Name and Address:  Alison MurrayAlma M Devine, MD  Relative Name and Phone Number:       Current Level of Care: Hospital Recommended Level of Care: Memory Care, Nursing Facility Prior Approval Number:    Date Approved/Denied:   PASRR Number:    Discharge Plan: Other (Comment) (memory care unit)    Current Diagnoses: Patient Active Problem List   Diagnosis Date Noted  . DNR (do not resuscitate)   . Palliative care encounter   . DVT, lower extremity, left 04/19/2015  . BRBPR (bright red blood per rectum) 12/10/2014  . Acute blood loss anemia   . GI bleed 12/06/2014  . Acute GI bleeding 12/05/2014  . Anxiety 12/05/2014  . Alzheimer's dementia 12/05/2014  . COPD (chronic obstructive pulmonary disease) (HCC) 12/05/2014  . HTN (hypertension) 12/05/2014  . Acute kidney injury (HCC) 12/05/2014    Orientation ACTIVITIES/SOCIAL BLADDER RESPIRATION     (disoriented x4)  Passive Incontinent Normal  BEHAVIORAL SYMPTOMS/MOOD NEUROLOGICAL BOWEL NUTRITION STATUS  Other (Comment) (uncooperative with care)   Incontinent Diet (mecanical chopped diet)  PHYSICIAN VISITS COMMUNICATION OF NEEDS Height & Weight Skin    Verbally 5\' 4"  (162.6 cm) 114 lbs. Other (Comment) (skin tear left tibia)          AMBULATORY STATUS RESPIRATION     (Pt uses w/c for moblization.) Normal      Personal Care Assistance Level of Assistance  Total care Bathing Assistance: Maximum assistance Feeding assistance: Maximum assistance Dressing Assistance: Maximum assistance Total Care Assistance: Maximum assistance    Functional Limitations Info  Sight,  Hearing Sight Info: Adequate Hearing Info: Adequate Speech Info: Adequate       SPECIAL CARE FACTORS FREQUENCY                      Additional Factors Info  Code Status, Psychotropic Code Status Info: DNR             Current Medications (04/26/2015):  This is the current hospital active medication list Current Facility-Administered Medications  Medication Dose Route Frequency Provider Last Rate Last Dose  . acetaminophen (TYLENOL) tablet 500 mg  500 mg Oral Q6H PRN Bobette Moavid Manuel Ortiz, MD      . ALPRAZolam Prudy Feeler(XANAX) tablet 0.25 mg  0.25 mg Oral Q6H PRN Bobette Moavid Manuel Ortiz, MD   0.25 mg at 04/25/15 2125  . alum & mag hydroxide-simeth (MAALOX/MYLANTA) 200-200-20 MG/5ML suspension 30 mL  30 mL Oral Q6H PRN Bobette Moavid Manuel Ortiz, MD      . apixaban Everlene Balls(ELIQUIS) tablet 2.5 mg  2.5 mg Oral Once Alison MurrayAlma M Devine, MD      . bumetanide [facility](BUMEX) tablet 0.5 mg  0.5 mg Oral Once per day on Mon Thu Bobette Moavid Manuel Ortiz, MD   0.5 mg at 04/24/15 1414  . citalopram (CELEXA) tablet 10 mg  10 mg Oral Daily Bobette Moavid Manuel Ortiz, MD   10 mg at 04/26/15 1100  . donepezil (ARICEPT) tablet 10 mg  10 mg Oral QHS Bobette Moavid Manuel Ortiz, MD   10 mg at 04/25/15 2126  . feeding supplement (ENSURE ENLIVE) (ENSURE ENLIVE) liquid 237 mL  237  mL Oral BID BM Dorothea Ogle, MD   237 mL at 04/26/15 1400  . guaiFENesin (ROBITUSSIN) 100 MG/5ML solution 200 mg  200 mg Oral Q6H PRN Bobette Mo, MD      . hydrALAZINE (APRESOLINE) tablet 10 mg  10 mg Oral BID Bobette Mo, MD   10 mg at 04/26/15 1100  . hydrocerin (EUCERIN) cream 1 application  1 application Topical BID Bobette Mo, MD   1 application at 04/26/15 1117  . ketotifen (ZADITOR) 0.025 % ophthalmic solution 1 drop  1 drop Both Eyes BID Bobette Mo, MD   1 drop at 04/26/15 1355  . loperamide (IMODIUM) capsule 2 mg  2 mg Oral PRN Bobette Mo, MD      . magnesium hydroxide (MILK OF MAGNESIA) suspension 30 mL  30 mL Oral Daily PRN Bobette Mo, MD      . mirtazapine (REMERON) tablet 30 mg  30 mg Oral QHS Bobette Mo, MD   30 mg at 04/25/15 2126  . pantoprazole (PROTONIX) EC tablet 40 mg  40 mg Oral Daily Bobette Mo, MD   40 mg at 04/25/15 4098  . potassium chloride 20 MEQ/15ML (10%) solution 10 mEq  10 mEq Oral Daily Bobette Mo, MD   10 mEq at 04/26/15 1059  . risperiDONE (RISPERDAL) tablet 0.25 mg  0.25 mg Oral Daily Bobette Mo, MD   0.25 mg at 04/25/15 1191  . risperiDONE (RISPERDAL) tablet 0.5 mg  0.5 mg Oral QHS Bobette Mo, MD   0.5 mg at 04/25/15 2126  . Vitamin D (Ergocalciferol) (DRISDOL) capsule 50,000 Units  50,000 Units Oral Q Thu Bobette Mo, MD   50,000 Units at 04/20/15 [phone]  . Warfarin - Pharmacist Dosing Inpatient   Does not apply q1800 Aleda Grana, RPH   0  at 04/20/15 1450     Discharge Medications: Please see discharge summary for a list of discharge medications.  TAKE these medications       acetaminophen 500 MG tablet  Commonly known as: TYLENOL  Take 500 mg by mouth every 6 (six) hours as needed for mild pain.     ALPRAZolam 0.25 MG tablet  Commonly known as: XANAX  Take 1 tablet (0.25 mg total) by mouth every 6 (six) hours as needed for anxiety (agitation). Not to exceed 6 tablets in 24 hours     alum & mag hydroxide-simeth 200-200-20 MG/5ML suspension  Commonly known as: MAALOX/MYLANTA  Take 30 mLs by mouth every 6 (six) hours as needed for indigestion or heartburn.     apixaban 2.5 MG Tabs tablet  Commonly known as: ELIQUIS  Take 1 tablet (2.5 mg total) by mouth 2 (two) times daily.     azelastine 0.05 % ophthalmic solution  Commonly known as: OPTIVAR  Place 1 drop into both eyes 2 (two) times daily.     bumetanide 1 MG tablet  Commonly known as: BUMEX  Take 0.5 mg by mouth 2 (two) times a week. Monday and Thursday.     citalopram 10 MG tablet  Commonly known as: CELEXA  Take 10 mg by mouth  daily.     donepezil 10 MG tablet  Commonly known as: ARICEPT  Take 10 mg by mouth at bedtime.     ENSURE  Take 237 mLs by mouth 3 (three) times daily with meals.     esomeprazole 20 MG capsule  Commonly known as: NEXIUM  Take 20  mg by mouth daily at 12 noon.     guaifenesin 100 MG/5ML syrup  Commonly known as: ROBITUSSIN  Take 200 mg by mouth every 6 (six) hours as needed for cough.     hydrALAZINE 10 MG tablet  Commonly known as: APRESOLINE  Take 1 tablet (10 mg total) by mouth 2 (two) times daily.     hydrocerin Crea  Apply 1 application topically 2 (two) times daily.     loperamide 2 MG capsule  Commonly known as: IMODIUM  Take 2 mg by mouth as needed for diarrhea or loose stools (not to exceed 8 doses in 24 hours).     magnesium hydroxide 400 MG/5ML suspension  Commonly known as: MILK OF MAGNESIA  Take 30 mLs by mouth daily as needed for mild constipation.     mirtazapine 30 MG tablet  Commonly known as: REMERON  Take 30 mg by mouth at bedtime.     potassium chloride 20 MEQ/15ML (10%) Soln  Take 10 mEq by mouth daily.     risperiDONE 0.25 MG tablet  Commonly known as: RISPERDAL  Take 1 tablet (0.25 mg total) by mouth daily.     risperiDONE 0.5 MG tablet  Commonly known as: RISPERDAL  Take 1 tablet (0.5 mg total) by mouth at bedtime.     TRIPLE ANTIBIOTIC EX  Apply 1 application topically. Apply after cleaning with normal saline, cover with bandaid or gauze and secure with tape. Change as needed until healed for skin tears, abrasions, or minor irritations.     Vitamin D (Ergocalciferol) 50000 UNITS Caps capsule  Commonly known as: DRISDOL  Take 50,000 Units by mouth every Thursday.                    Relevant Imaging Results:  Relevant Lab Results:  Recent Labs    Additional Information Continue Hospice services at memory care unit.  Alixandra Alfieri, Dickey GaveJamie Lee,  LCSW

## 2015-04-26 NOTE — Progress Notes (Signed)
[facility]. Report given to Tharon AquasGeraldine Posadas, med tech. She did not have questions at the time of report. Advised to call back with any questions.

## 2015-04-26 NOTE — Progress Notes (Signed)
[facility]. FL2 and D/C Summary sent to facility for review prior to d/c. [name] is aware of d/c today and is in agreement with d/c plan. PTAR transport is required. Medical necessity form completed.  Cori RazorJamie Violet Cart LCSW [phone]

## 2015-04-26 NOTE — Progress Notes (Signed)
ANTICOAGULATION CONSULT NOTE - Follow Up Consult  Pharmacy Consult for Warfarin/Lovenox Indication: new DVT  No Known Allergies  Patient Measurements: Height: 5\' 4"  (162.6 cm) Weight: 114 lb 3.2 oz (51.8 kg) IBW/kg (Calculated) : 54.7  Vital Signs: Temp: 98.1 F (36.7 C) (12/06 2147) Temp Source: Axillary (12/06 2147) BP: 142/68 mmHg (12/06 2147) Pulse Rate: 62 (12/06 2147)  Labs:  Recent Labs  04/24/15 0841 04/25/15 0920 04/26/15 0450  HGB 13.2  --   --   HCT 41.4  --   --   PLT 337  --   --   LABPROT 16.7* 21.5* 28.2*  INR 1.34 1.88* 2.69*  CREATININE  --   --  1.33*    Estimated Creatinine Clearance: 20.2 mL/min (by C-G formula based on Cr of 1.33).   Assessment: [age] y/oF with advanced dementia presents from SNF with acute VTE diagnosed at facility. Original plan was to discharge back to facility but due to inability of facility to give injection, patient admitted for bridge therapy. Pharmacy asked to dose warfarin for acute VTE  Today, 04/26/2015:  INR now therapeutic at 2.69 but increased from 1.88 - inpatient warfarin doses administered: 2.5mg , 5mg , 5mg , 5mg , 7.5mg , and 5mg   CBC stable, improving  No bleeding issues documented  Heart healthy diet - 25-100% charted  No major DDIs   CKD, scr improved to 1.33 but CrCl still only est 20 ml/min  Goal of Therapy:  INR 2-3 Monitor platelets by anticoagulation protocol: Yes   Plan:  Pt has completed the 5 day overlap for acute VTE with enoxaparin but now awaiting INR > 2 x 24hr  Reduce warfarin to 2.5mg  PO x1 today due to sharp rise in INR   Continue Enoxaparin 50 mg SQ q24h (dose appr for renal function) for a minimum of 5 day overlap and until INR =/> 2 for > 24 hours, SO CAN VERY LIKELY DISCONTINUE LOVENOX TOMORROW 12/8 WHEN INR SHOULD BE >2 AGAIN so last Lovenox dose likely tonight at 2200  Daily PT/INR.  Will order SCr to check renal function for enoxaparin dosing  CBC q72h while on full dose  Lovenox  Monitor for s/s of bleeding.  Educated patient's son on warfarin on 04/21/15.   Hessie KnowsJustin M Sherma Vanmetre, PharmD, BCPS Pager [phone] 04/26/2015 8:53 AM

## 2015-04-26 NOTE — NC FL2 (Deleted)
[place] MEDICAID FL2 LEVEL OF CARE SCREENING TOOL     IDENTIFICATION  Patient Name: [name] Birthdate: 10/05/1916 Sex: female Admission Date (Current Location): 04/19/2015  [facility] [place]     Facility and Address:  [facility],  [address], [place] [id number]      Provider Number: [id number]  Attending Physician Name and Address:  Alison MurrayAlma M Devine, MD  Relative Name and Phone Number:       Current Level of Care: Hospital Recommended Level of Care: Memory Care, Nursing Facility Prior Approval Number:    Date Approved/Denied:   PASRR Number:    Discharge Plan: Other (Comment) (memory care unit)    Current Diagnoses: Patient Active Problem List   Diagnosis Date Noted  . DNR (do not resuscitate)   . Palliative care encounter   . DVT, lower extremity, left 04/19/2015  . BRBPR (bright red blood per rectum) 12/10/2014  . Acute blood loss anemia   . GI bleed 12/06/2014  . Acute GI bleeding 12/05/2014  . Anxiety 12/05/2014  . Alzheimer's dementia 12/05/2014  . COPD (chronic obstructive pulmonary disease) (HCC) 12/05/2014  . HTN (hypertension) 12/05/2014  . Acute kidney injury (HCC) 12/05/2014    Orientation ACTIVITIES/SOCIAL BLADDER RESPIRATION     (disoriented x4)  Passive Incontinent Normal  BEHAVIORAL SYMPTOMS/MOOD NEUROLOGICAL BOWEL NUTRITION STATUS  Other (Comment) (uncooperative with care)   Incontinent Diet (mecanical chopped diet)  PHYSICIAN VISITS COMMUNICATION OF NEEDS Height & Weight Skin    Verbally 5\' 4"  (162.6 cm) 114 lbs. Other (Comment) (skin tear left tibia)          AMBULATORY STATUS RESPIRATION     (Pt uses w/c for moblization.) Normal      Personal Care Assistance Level of Assistance  Total care Bathing Assistance: Maximum assistance Feeding assistance: Maximum assistance Dressing Assistance: Maximum assistance Total Care Assistance: Maximum assistance    Functional Limitations Info  Sight,  Hearing Sight Info: Adequate Hearing Info: Adequate Speech Info: Adequate       SPECIAL CARE FACTORS FREQUENCY                      Additional Factors Info  Code Status, Psychotropic Code Status Info: DNR             Current Medications (04/26/2015):  This is the current hospital active medication list Current Facility-Administered Medications  Medication Dose Route Frequency Provider Last Rate Last Dose  . acetaminophen (TYLENOL) tablet 500 mg  500 mg Oral Q6H PRN Bobette Moavid Manuel Ortiz, MD      . ALPRAZolam Prudy Feeler(XANAX) tablet 0.25 mg  0.25 mg Oral Q6H PRN Bobette Moavid Manuel Ortiz, MD   0.25 mg at 04/25/15 2125  . alum & mag hydroxide-simeth (MAALOX/MYLANTA) 200-200-20 MG/5ML suspension 30 mL  30 mL Oral Q6H PRN Bobette Moavid Manuel Ortiz, MD      . apixaban Everlene Balls(ELIQUIS) tablet 2.5 mg  2.5 mg Oral Once Alison MurrayAlma M Devine, MD      . bumetanide [facility](BUMEX) tablet 0.5 mg  0.5 mg Oral Once per day on Mon Thu Bobette Moavid Manuel Ortiz, MD   0.5 mg at 04/24/15 1414  . citalopram (CELEXA) tablet 10 mg  10 mg Oral Daily Bobette Moavid Manuel Ortiz, MD   10 mg at 04/26/15 1100  . donepezil (ARICEPT) tablet 10 mg  10 mg Oral QHS Bobette Moavid Manuel Ortiz, MD   10 mg at 04/25/15 2126  . feeding supplement (ENSURE ENLIVE) (ENSURE ENLIVE) liquid 237 mL  237  mL Oral BID BM Dorothea Ogle, MD   237 mL at 04/26/15 1400  . guaiFENesin (ROBITUSSIN) 100 MG/5ML solution 200 mg  200 mg Oral Q6H PRN Bobette Mo, MD      . hydrALAZINE (APRESOLINE) tablet 10 mg  10 mg Oral BID Bobette Mo, MD   10 mg at 04/26/15 1100  . hydrocerin (EUCERIN) cream 1 application  1 application Topical BID Bobette Mo, MD   1 application at 04/26/15 1117  . ketotifen (ZADITOR) 0.025 % ophthalmic solution 1 drop  1 drop Both Eyes BID Bobette Mo, MD   1 drop at 04/26/15 1355  . loperamide (IMODIUM) capsule 2 mg  2 mg Oral PRN Bobette Mo, MD      . magnesium hydroxide (MILK OF MAGNESIA) suspension 30 mL  30 mL Oral Daily PRN Bobette Mo, MD      . mirtazapine (REMERON) tablet 30 mg  30 mg Oral QHS Bobette Mo, MD   30 mg at 04/25/15 2126  . pantoprazole (PROTONIX) EC tablet 40 mg  40 mg Oral Daily Bobette Mo, MD   40 mg at 04/25/15 5366  . potassium chloride 20 MEQ/15ML (10%) solution 10 mEq  10 mEq Oral Daily Bobette Mo, MD   10 mEq at 04/26/15 1059  . risperiDONE (RISPERDAL) tablet 0.25 mg  0.25 mg Oral Daily Bobette Mo, MD   0.25 mg at 04/25/15 4403  . risperiDONE (RISPERDAL) tablet 0.5 mg  0.5 mg Oral QHS Bobette Mo, MD   0.5 mg at 04/25/15 2126  . Vitamin D (Ergocalciferol) (DRISDOL) capsule 50,000 Units  50,000 Units Oral Q Thu Bobette Mo, MD   50,000 Units at 04/20/15 [phone]  . Warfarin - Pharmacist Dosing Inpatient   Does not apply q1800 Aleda Grana, RPH   0  at 04/20/15 1450     Discharge Medications: Please see discharge summary for a list of discharge medications.  Relevant Imaging Results:  Relevant Lab Results:  Recent Labs    Additional Information Continue Hospice services at memory care unit.  Donica Derouin, Dickey Gave, LCSW

## 2015-04-28 ENCOUNTER — Emergency Department (HOSPITAL_COMMUNITY)
Admission: EM | Admit: 2015-04-28 | Discharge: 2015-04-28 | Disposition: A | Discharge status: Home / Self Care | Payer: Medicare Other | Attending: Emergency Medicine | Admitting: Emergency Medicine

## 2015-04-28 ENCOUNTER — Emergency Department (HOSPITAL_COMMUNITY): Payer: Medicare Other

## 2015-04-28 ENCOUNTER — Encounter (HOSPITAL_COMMUNITY): Payer: Self-pay

## 2015-04-28 DIAGNOSIS — Y998 Other external cause status: Secondary | ICD-10-CM | POA: Insufficient documentation

## 2015-04-28 DIAGNOSIS — Z79899 Other long term (current) drug therapy: Secondary | ICD-10-CM | POA: Insufficient documentation

## 2015-04-28 DIAGNOSIS — Z87448 Personal history of other diseases of urinary system: Secondary | ICD-10-CM | POA: Insufficient documentation

## 2015-04-28 DIAGNOSIS — J449 Chronic obstructive pulmonary disease, unspecified: Secondary | ICD-10-CM | POA: Insufficient documentation

## 2015-04-28 DIAGNOSIS — Z043 Encounter for examination and observation following other accident: Secondary | ICD-10-CM | POA: Diagnosis present

## 2015-04-28 DIAGNOSIS — Z7902 Long term (current) use of antithrombotics/antiplatelets: Secondary | ICD-10-CM | POA: Insufficient documentation

## 2015-04-28 DIAGNOSIS — I10 Essential (primary) hypertension: Secondary | ICD-10-CM | POA: Insufficient documentation

## 2015-04-28 DIAGNOSIS — F0280 Dementia in other diseases classified elsewhere without behavioral disturbance: Secondary | ICD-10-CM | POA: Insufficient documentation

## 2015-04-28 DIAGNOSIS — Y9389 Activity, other specified: Secondary | ICD-10-CM | POA: Insufficient documentation

## 2015-04-28 DIAGNOSIS — Y92128 Other place in nursing home as the place of occurrence of the external cause: Secondary | ICD-10-CM | POA: Diagnosis not present

## 2015-04-28 DIAGNOSIS — G309 Alzheimer's disease, unspecified: Secondary | ICD-10-CM | POA: Diagnosis not present

## 2015-04-28 DIAGNOSIS — W19XXXA Unspecified fall, initial encounter: Secondary | ICD-10-CM | POA: Diagnosis not present

## 2015-04-28 DIAGNOSIS — F419 Anxiety disorder, unspecified: Secondary | ICD-10-CM | POA: Insufficient documentation

## 2015-04-28 DIAGNOSIS — K219 Gastro-esophageal reflux disease without esophagitis: Secondary | ICD-10-CM | POA: Insufficient documentation

## 2015-04-28 NOTE — ED Provider Notes (Signed)
CSN: [id number]     Arrival date & time 04/28/15  0448 History   First MD Initiated Contact with Patient 04/28/15 0505     Chief Complaint  Patient presents with  . Fall     (Consider location/radiation/quality/duration/timing/severity/associated sxs/prior Treatment) HPI Comments: Patient is a 81 year old female with a history of dementia. She is brought from a local nursing home for evaluation of a fall. She was apparently found on the floor beside her bed and it was unknown as to how she got there. Patient has no recollection and adds no meaningful history due to her history of dementia.  Patient is a 81 y.o. female presenting with fall. The history is provided by the patient.  Fall This is a new problem. The current episode started less than 1 hour ago. The problem occurs constantly. The problem has not changed since onset.Nothing aggravates the symptoms. Nothing relieves the symptoms.    Past Medical History  Diagnosis Date  . GERD (gastroesophageal reflux disease)   . Anxiety   . Dementia   . Alzheimer disease   . COPD (chronic obstructive pulmonary disease) (HCC)   . Hypertension   . Renal disorder    Past Surgical History  Procedure Laterality Date  . Joint replacement     No family history on file. Social History  Substance Use Topics  . Smoking status: Never Smoker   . Smokeless tobacco: None  . Alcohol Use: No   OB History    No data available     Review of Systems  All other systems reviewed and are negative.     Allergies  Review of patient's allergies indicates no known allergies.  Home Medications   Prior to Admission medications   Medication Sig Start Date End Date Taking? Authorizing Provider  acetaminophen (TYLENOL) 500 MG tablet Take 500 mg by mouth every 6 (six) hours as needed for mild pain.    Historical Provider, MD  ALPRAZolam Prudy Feeler(XANAX) 0.25 MG tablet Take 1 tablet (0.25 mg total) by mouth every 6 (six) hours as needed for anxiety  (agitation). Not to exceed 6 tablets in 24 hours 04/21/15   Dorothea OgleIskra M Myers, MD  alum & mag hydroxide-simeth (MAALOX/MYLANTA) 200-200-20 MG/5ML suspension Take 30 mLs by mouth every 6 (six) hours as needed for indigestion or heartburn.    Historical Provider, MD  apixaban (ELIQUIS) 2.5 MG TABS tablet Take 1 tablet (2.5 mg total) by mouth 2 (two) times daily. 04/26/15   Alison MurrayAlma M Devine, MD  azelastine (OPTIVAR) 0.05 % ophthalmic solution Place 1 drop into both eyes 2 (two) times daily.    Historical Provider, MD  bumetanide (BUMEX) 1 MG tablet Take 0.5 mg by mouth 2 (two) times a week. Monday and Thursday.    Historical Provider, MD  citalopram (CELEXA) 10 MG tablet Take 10 mg by mouth daily.    Historical Provider, MD  donepezil (ARICEPT) 10 MG tablet Take 10 mg by mouth at bedtime.    Historical Provider, MD  ENSURE (ENSURE) Take 237 mLs by mouth 3 (three) times daily with meals.    Historical Provider, MD  esomeprazole (NEXIUM) 20 MG capsule Take 20 mg by mouth daily at 12 noon.    Historical Provider, MD  guaifenesin (ROBITUSSIN) 100 MG/5ML syrup Take 200 mg by mouth every 6 (six) hours as needed for cough.     Historical Provider, MD  hydrALAZINE (APRESOLINE) 10 MG tablet Take 1 tablet (10 mg total) by mouth 2 (two) times daily. 12/07/14  Alison Murray, MD  hydrocerin (EUCERIN) CREA Apply 1 application topically 2 (two) times daily.    Historical Provider, MD  loperamide (IMODIUM) 2 MG capsule Take 2 mg by mouth as needed for diarrhea or loose stools (not to exceed 8 doses in 24 hours).    Historical Provider, MD  magnesium hydroxide (MILK OF MAGNESIA) 400 MG/5ML suspension Take 30 mLs by mouth daily as needed for mild constipation.    Historical Provider, MD  mirtazapine (REMERON) 30 MG tablet Take 30 mg by mouth at bedtime.    Historical Provider, MD  Neomycin-Bacitracin-Polymyxin (TRIPLE ANTIBIOTIC EX) Apply 1 application topically. Apply after cleaning with normal saline, cover with bandaid or gauze  and secure with tape. Change as needed until healed for skin tears, abrasions, or minor irritations.    Historical Provider, MD  potassium chloride 20 MEQ/15ML (10%) SOLN Take 10 mEq by mouth daily.     Historical Provider, MD  risperiDONE (RISPERDAL) 0.25 MG tablet Take 1 tablet (0.25 mg total) by mouth daily. 12/07/14   Alison Murray, MD  risperiDONE (RISPERDAL) 0.5 MG tablet Take 1 tablet (0.5 mg total) by mouth at bedtime. 12/07/14   Alison Murray, MD  Vitamin D, Ergocalciferol, (DRISDOL) 50000 UNITS CAPS capsule Take 50,000 Units by mouth every Thursday.    Historical Provider, MD   BP 170/55 mmHg  Pulse 66  Temp(Src) 97.9 F (36.6 C) (Oral)  Resp 20  SpO2 100% Physical Exam  Constitutional: She appears well-developed and well-nourished. No distress.  Patient is an elderly female in no acute distress. She is awake but extremely hard of hearing.  HENT:  Head: Normocephalic and atraumatic.  Eyes: EOM are normal. Pupils are equal, round, and reactive to light.  Neck: Normal range of motion. Neck supple.  Cardiovascular: Normal rate and regular rhythm.  Exam reveals no gallop and no friction rub.   No murmur heard. Pulmonary/Chest: Effort normal and breath sounds normal. No respiratory distress. She has no wheezes.  Abdominal: Soft. Bowel sounds are normal. She exhibits no distension. There is no tenderness.  Musculoskeletal: Normal range of motion.  Neurological:  Patient is awake and will follow commands. She is difficult to communicate with secondary to her being hard of hearing. She is noted to move all 4 extremities to command.  Skin: Skin is warm and dry. She is not diaphoretic.  Nursing note and vitals reviewed.   ED Course  Procedures (including critical care time) Labs Review Labs Reviewed - No data to display  Imaging Review No results found. I have personally reviewed and evaluated these images and lab results as part of my medical decision-making.   EKG  Interpretation None      MDM   Final diagnoses:  None    Patient brought for evaluation after being found on the floor at the nursing home. She has a history of dementia and adds very little bit the history. This also complicates the physical examination as well. She did undergo a CT of the head and cervical spine, both were unremarkable. X-rays of the chest and pelvis were obtained as well and did not reveal any fracture or dislocation. Her physical examination is otherwise benign. I see no abdominal or thoracic contusions. There is no deformity of the extremities and she has good range of motion without significant discomfort. She appears uninjured and I believe is appropriate for discharge.    Geoffery Lyons, MD 04/28/15 [phone]

## 2015-04-28 NOTE — ED Notes (Signed)
Per [facility]; pt from [facility] - Pt had unwitnessed fall, possibly slid out of bed, found in a sitting position next to bed. Unknown if she hit head or lost consciousness. No obvious injury to head. Pt is on blood thinner. Pt was initially complaining of neck pain, [facility] applied a towel to her neck, and a KED. Pt seemed less agitated after these were applied. Pt now complaining of back pain. Pt is at baseline per the nursing home staff. [facility] couldn't complete SCCA, pt didn't have much effort, was shaking bilaterally. Pt is oriented to self.

## 2015-04-28 NOTE — ED Notes (Signed)
[facility], Pt stable and NAD. All x-rays and scans were negative.

## 2015-04-28 NOTE — Discharge Instructions (Signed)

## 2015-04-28 NOTE — ED Notes (Signed)
Attempted to call report x [facility] to [facility] and no answer.

## 2015-04-28 NOTE — ED Notes (Signed)
Pt states "It hurts in my shoulder blades" but when patient is asked where in the shoulder or which shoulder pt states "I don't know"

## 2015-05-18 ENCOUNTER — Encounter (HOSPITAL_COMMUNITY): Payer: Self-pay | Admitting: Emergency Medicine

## 2015-05-18 ENCOUNTER — Emergency Department (HOSPITAL_COMMUNITY)
Admission: EM | Admit: 2015-05-18 | Discharge: 2015-05-18 | Disposition: A | Discharge status: Home / Self Care | Attending: Emergency Medicine | Admitting: Emergency Medicine

## 2015-05-18 ENCOUNTER — Emergency Department (HOSPITAL_BASED_OUTPATIENT_CLINIC_OR_DEPARTMENT_OTHER): Admit: 2015-05-18 | Discharge: 2015-05-18 | Disposition: A | Discharge status: Home / Self Care

## 2015-05-18 DIAGNOSIS — I82402 Acute embolism and thrombosis of unspecified deep veins of left lower extremity: Secondary | ICD-10-CM | POA: Diagnosis not present

## 2015-05-18 DIAGNOSIS — I129 Hypertensive chronic kidney disease with stage 1 through stage 4 chronic kidney disease, or unspecified chronic kidney disease: Secondary | ICD-10-CM | POA: Insufficient documentation

## 2015-05-18 DIAGNOSIS — N189 Chronic kidney disease, unspecified: Secondary | ICD-10-CM | POA: Diagnosis not present

## 2015-05-18 DIAGNOSIS — Z79899 Other long term (current) drug therapy: Secondary | ICD-10-CM | POA: Insufficient documentation

## 2015-05-18 DIAGNOSIS — F419 Anxiety disorder, unspecified: Secondary | ICD-10-CM | POA: Insufficient documentation

## 2015-05-18 DIAGNOSIS — K219 Gastro-esophageal reflux disease without esophagitis: Secondary | ICD-10-CM | POA: Insufficient documentation

## 2015-05-18 DIAGNOSIS — F0280 Dementia in other diseases classified elsewhere without behavioral disturbance: Secondary | ICD-10-CM | POA: Diagnosis not present

## 2015-05-18 DIAGNOSIS — Z86718 Personal history of other venous thrombosis and embolism: Secondary | ICD-10-CM | POA: Diagnosis not present

## 2015-05-18 DIAGNOSIS — G309 Alzheimer's disease, unspecified: Secondary | ICD-10-CM | POA: Diagnosis not present

## 2015-05-18 DIAGNOSIS — Z7902 Long term (current) use of antithrombotics/antiplatelets: Secondary | ICD-10-CM | POA: Insufficient documentation

## 2015-05-18 DIAGNOSIS — Z87448 Personal history of other diseases of urinary system: Secondary | ICD-10-CM | POA: Insufficient documentation

## 2015-05-18 DIAGNOSIS — R2242 Localized swelling, mass and lump, left lower limb: Secondary | ICD-10-CM | POA: Diagnosis present

## 2015-05-18 DIAGNOSIS — J449 Chronic obstructive pulmonary disease, unspecified: Secondary | ICD-10-CM | POA: Diagnosis not present

## 2015-05-18 LAB — CBC WITH DIFFERENTIAL/PLATELET
BASOS ABS: 0 10*3/uL (ref 0.0–0.1)
Basophils Relative: 0 %
EOS PCT: 1 %
Eosinophils Absolute: 0.1 10*3/uL (ref 0.0–0.7)
HCT: 35.1 % — ABNORMAL LOW (ref 36.0–46.0)
HEMOGLOBIN: 11 g/dL — AB (ref 12.0–15.0)
LYMPHS ABS: 1.1 10*3/uL (ref 0.7–4.0)
LYMPHS PCT: 15 %
MCH: 27.9 pg (ref 26.0–34.0)
MCHC: 31.3 g/dL (ref 30.0–36.0)
MCV: 89.1 fL (ref 78.0–100.0)
Monocytes Absolute: 0.6 10*3/uL (ref 0.1–1.0)
Monocytes Relative: 8 %
NEUTROS ABS: 5.7 10*3/uL (ref 1.7–7.7)
Neutrophils Relative %: 76 %
PLATELETS: 239 10*3/uL (ref 150–400)
RBC: 3.94 MIL/uL (ref 3.87–5.11)
RDW: 15.9 % — AB (ref 11.5–15.5)
WBC: 7.5 10*3/uL (ref 4.0–10.5)

## 2015-05-18 LAB — COMPREHENSIVE METABOLIC PANEL
ALBUMIN: 3.6 g/dL (ref 3.5–5.0)
ALT: 13 U/L — AB (ref 14–54)
AST: 18 U/L (ref 15–41)
Alkaline Phosphatase: 68 U/L (ref 38–126)
Anion gap: 10 (ref 5–15)
BUN: 21 mg/dL — AB (ref 6–20)
CHLORIDE: 106 mmol/L (ref 101–111)
CO2: 26 mmol/L (ref 22–32)
Calcium: 8.9 mg/dL (ref 8.9–10.3)
Creatinine, Ser: 1.14 mg/dL — ABNORMAL HIGH (ref 0.44–1.00)
GFR calc Af Amer: 46 mL/min — ABNORMAL LOW (ref >60)
GFR, EST NON AFRICAN AMERICAN: 39 mL/min — AB (ref >60)
GLUCOSE: 102 mg/dL — AB (ref 65–99)
POTASSIUM: 4 mmol/L (ref 3.5–5.1)
SODIUM: 142 mmol/L (ref 135–145)
Total Bilirubin: 0.4 mg/dL (ref 0.3–1.2)
Total Protein: 6.6 g/dL (ref 6.5–8.1)

## 2015-05-18 NOTE — ED Notes (Signed)
Vascular attempting to do US, pt was scratching and trying to hit vascular tech. rn obtained order for soft restraints. rn applied soft restraints to bil arms, and stayed in room to help hold pt while vascular assesed pt. Pt in restraints for estimated 10 minutes. At end of vascular assessment pt spit at tech. Restrains removed.

## 2015-05-18 NOTE — ED Provider Notes (Signed)
CSN: [id number]     Arrival date & time 05/18/15  1734 History   First MD Initiated Contact with Patient 05/18/15 1750     Chief Complaint  Patient presents with  . Leg Swelling     (Consider location/radiation/quality/duration/timing/severity/associated sxs/prior Treatment) HPI Level V caveat - dementia  81 year old female who presents with left leg swelling. History of dementia, HTN, CKD. Recently diagnosed with left sided DVT in 03/2015 and started on Eliquis. Sent in from [facility] for concern of swelling in the left lower leg today. Patient has no complaints. Denies pain, shortness of breath, chest pain. States she wants to go home.   Past Medical History  Diagnosis Date  . GERD (gastroesophageal reflux disease)   . Anxiety   . Dementia   . Alzheimer disease   . COPD (chronic obstructive pulmonary disease) (HCC)   . Hypertension   . Renal disorder    Past Surgical History  Procedure Laterality Date  . Joint replacement     History reviewed. No pertinent family history. Social History  Substance Use Topics  . Smoking status: Never Smoker   . Smokeless tobacco: None  . Alcohol Use: No   OB History    No data available     Review of Systems  Unable to perform ROS: Dementia      Allergies  Review of patient's allergies indicates no known allergies.  Home Medications   Prior to Admission medications   Medication Sig Start Date End Date Taking? Authorizing Provider  acetaminophen (TYLENOL) 500 MG tablet Take 500 mg by mouth every 6 (six) hours as needed for mild pain.   Yes Historical Provider, MD  ALPRAZolam (XANAX) 0.25 MG tablet Take 1 tablet (0.25 mg total) by mouth every 6 (six) hours as needed for anxiety (agitation). Not to exceed 6 tablets in 24 hours 04/21/15  Yes Dorothea OgleIskra M Myers, MD  alum & mag hydroxide-simeth (MAALOX/MYLANTA) 200-200-20 MG/5ML suspension Take 30 mLs by mouth every 6 (six) hours as needed for indigestion or heartburn.   Yes  Historical Provider, MD  apixaban (ELIQUIS) 2.5 MG TABS tablet Take 1 tablet (2.5 mg total) by mouth 2 (two) times daily. 04/26/15  Yes Alison MurrayAlma M Devine, MD  azelastine (OPTIVAR) 0.05 % ophthalmic solution Place 1 drop into both eyes 2 (two) times daily.   Yes Historical Provider, MD  bumetanide (BUMEX) 1 MG tablet Take 0.5 mg by mouth 2 (two) times a week. Monday and Thursday.   Yes Historical Provider, MD  citalopram (CELEXA) 10 MG tablet Take 10 mg by mouth daily.   Yes Historical Provider, MD  donepezil (ARICEPT) 10 MG tablet Take 10 mg by mouth at bedtime.   Yes Historical Provider, MD  ENSURE (ENSURE) Take 237 mLs by mouth 3 (three) times daily with meals. Mighty shakes   Yes Historical Provider, MD  esomeprazole (NEXIUM) 20 MG capsule Take 20 mg by mouth daily.    Yes Historical Provider, MD  guaifenesin (ROBITUSSIN) 100 MG/5ML syrup Take 200 mg by mouth every 6 (six) hours as needed for cough.    Yes Historical Provider, MD  hydrALAZINE (APRESOLINE) 10 MG tablet Take 1 tablet (10 mg total) by mouth 2 (two) times daily. 12/07/14  Yes Alison MurrayAlma M Devine, MD  hydrocerin (EUCERIN) CREA Apply 1 application topically 2 (two) times daily.   Yes Historical Provider, MD  loperamide (IMODIUM) 2 MG capsule Take 2 mg by mouth as needed for diarrhea or loose stools (not to exceed 8  doses in 24 hours).   Yes Historical Provider, MD  magnesium hydroxide (MILK OF MAGNESIA) 400 MG/5ML suspension Take 30 mLs by mouth daily as needed for mild constipation.   Yes Historical Provider, MD  mirtazapine (REMERON) 30 MG tablet Take 30 mg by mouth at bedtime.   Yes Historical Provider, MD  Neomycin-Bacitracin-Polymyxin (TRIPLE ANTIBIOTIC EX) Apply 1 application topically. Apply after cleaning with normal saline, cover with bandaid or gauze and secure with tape. Change as needed until healed for skin tears, abrasions, or minor irritations.   Yes Historical Provider, MD  potassium chloride 20 MEQ/15ML (10%) SOLN Take 10 mEq by  mouth daily.    Yes Historical Provider, MD  risperiDONE (RISPERDAL) 0.25 MG tablet Take 1 tablet (0.25 mg total) by mouth daily. 12/07/14  Yes Alison Murray, MD  risperiDONE (RISPERDAL) 0.5 MG tablet Take 1 tablet (0.5 mg total) by mouth at bedtime. 12/07/14  Yes Alison Murray, MD  Vitamin D, Ergocalciferol, (DRISDOL) 50000 UNITS CAPS capsule Take 50,000 Units by mouth every Thursday.   Yes Historical Provider, MD   BP 179/62 mmHg  Pulse 63  Temp(Src) 97.5 F (36.4 C) (Oral)  Resp 16  SpO2 96% Physical Exam Physical Exam  Nursing note and vitals reviewed. Constitutional: Elderly woman, non-toxic, and in no acute distress Head: Normocephalic and atraumatic.  Mouth/Throat: Oropharynx is clear and moist.  Neck: Normal range of motion. Neck supple.  Cardiovascular: Normal rate and regular rhythm.   Pulmonary/Chest: Effort normal and breath sounds normal.  Abdominal: Soft. There is no tenderness. There is no rebound and no guarding.  Musculoskeletal: Left calf with edema, overlying erythema, and warmth. No drainage.  NO swelling in the right calf. Neurological: Alert, moves all extremities symmetrically Skin: Skin is warm and dry.  Psychiatric: Cooperative  ED Course  Procedures (including critical care time) Labs Review Labs Reviewed  CBC WITH DIFFERENTIAL/PLATELET - Abnormal; Notable for the following:    Hemoglobin 11.0 (*)    HCT 35.1 (*)    RDW 15.9 (*)    All other components within normal limits  COMPREHENSIVE METABOLIC PANEL - Abnormal; Notable for the following:    Glucose, Bld 102 (*)    BUN 21 (*)    Creatinine, Ser 1.14 (*)    ALT 13 (*)    GFR calc non Af Amer 39 (*)    GFR calc Af Amer 46 (*)    All other components within normal limits    Imaging Review No results found.   I have personally reviewed and evaluated these images and lab results as part of my medical decision-making.   MDM   Final diagnoses:  DVT (deep venous thrombosis), left     81 year old female with history of Alzheimer's dementia and recent left lower extremity DVT on Eliquis who presents with persistent swelling and redness to her left leg. He is nontoxic and in no acute distress, with non-concerning vital signs. She on exam has a swollen left calf that is erythematous and warm to touch without overlying drainage. Consistent with cellulitis. When patient was initially evaluated for her DVT on 04/19/2015, this was a similar description. Ultrasound of her extremity reveals subacute DVT in her left mid to distal femoral vein and popliteal veins. This is somewhat improved to her initial ultrasound showing common femoral, femoral vein and popliteal vein DVTs on the left side in November. She has baseline if not improved renal function. She is on adequate treatment with Eliquis and was encouraged to  continue treatment for home. No concern for PE or other acute processes at this time. Discharged to [employer].  Lavera Guise, MD 05/18/15 2240

## 2015-05-18 NOTE — ED Notes (Signed)
[facility] here to take patient at this time. 

## 2015-05-18 NOTE — Progress Notes (Signed)
*  PRELIMINARY RESULTS* Vascular Ultrasound Left lower extremity venous duplex has been completed.  Preliminary findings: Technically limited/ poor visualization of veins due to pt being extremely combative. Wrist restraints were placed. Appears to be subacute DVT in the left mid to distal femoral vein and popliteal vein. Unable to evaluate calf veins.   Farrel DemarkJill Eunice, RDMS, RVT  05/18/2015, 7:11 PM

## 2015-05-18 NOTE — ED Notes (Signed)
[facility] notified, gave report to nurse at [facility], did not get her name.

## 2015-05-18 NOTE — Discharge Instructions (Signed)
Please continue to take Eliquis (Apixiban) as prescribed. You show persistent blood clot in the left mid to distal femoral vein and popliteal vein. This is the same clot (although improving) that was identified on 04/19/2015, for which she is hospitalized for. Return without fail for worsening symptoms, including difficulty breathing, chest pain, or any other symptoms concerning to you.  Deep Vein Thrombosis A deep vein thrombosis (DVT) is a blood clot (thrombus) that usually occurs in a deep, larger vein of the lower leg or the pelvis, or in an upper extremity such as the arm. These are dangerous and can lead to serious and even life-threatening complications if the clot travels to the lungs. A DVT can damage the valves in your leg veins so that instead of flowing upward, the blood pools in the lower leg. This is called post-thrombotic syndrome, and it can result in pain, swelling, discoloration, and sores on the leg. CAUSES A DVT is caused by the formation of a blood clot in your leg, pelvis, or arm. Usually, several things contribute to the formation of blood clots. A clot may develop when:  Your blood flow slows down.  Your vein becomes damaged in some way.  You have a condition that makes your blood clot more easily. RISK FACTORS A DVT is more likely to develop in:  People who are older, especially over 92 years of age.  People who are overweight (obese).  People who sit or lie still for a long time, such as during long-distance travel (over 4 hours), bed rest, hospitalization, or during recovery from certain medical conditions like a stroke.  People who do not engage in much physical activity (sedentary lifestyle).  People who have chronic breathing disorders.  People who have a personal or family history of blood clots or blood clotting disease.  People who have peripheral vascular disease (PVD), diabetes, or some types of cancer.  People who have heart disease, especially if the  person had a recent heart attack or has congestive heart failure.  People who have neurological diseases that affect the legs (leg paresis).  People who have had a traumatic injury, such as breaking a hip or leg.  People who have recently had major or lengthy surgery, especially on the hip, knee, or abdomen.  People who have had a central line placed inside a large vein.  People who take medicines that contain the hormone estrogen. These include birth control pills and hormone replacement therapy.  Pregnancy or during childbirth or the postpartum period.  Long plane flights (over 8 hours). SIGNS AND SYMPTOMS Symptoms of a DVT can include:   Swelling of your leg or arm, especially if one side is much worse.  Warmth and redness of your leg or arm, especially if one side is much worse.  Pain in your arm or leg. If the clot is in your leg, symptoms may be more noticeable or worse when you stand or walk.  A feeling of pins and needles, if the clot is in the arm. The symptoms of a DVT that has traveled to the lungs (pulmonary embolism, PE) usually start suddenly and include:  Shortness of breath while active or at rest.  Coughing or coughing up blood or blood-tinged mucus.  Chest pain that is often worse with deep breaths.  Rapid or irregular heartbeat.  Feeling light-headed or dizzy.  Fainting.  Feeling anxious.  Sweating. There may also be pain and swelling in a leg if that is where the blood clot started.  These symptoms may represent a serious problem that is an emergency. Do not wait to see if the symptoms will go away. Get medical help right away. Call your local emergency services (911 in the [place] Do not drive yourself to the hospital. DIAGNOSIS Your health care provider will take a medical history and perform a physical exam. You may also have other tests, including:  Blood tests to assess the clotting properties of your blood.  Imaging tests, such as CT,  ultrasound, MRI, X-ray, and other tests to see if you have clots anywhere in your body. TREATMENT After a DVT is identified, it can be treated. The type of treatment that you receive depends on many factors, such as the cause of your DVT, your risk for bleeding or developing more clots, and other medical conditions that you have. Sometimes, a combination of treatments is necessary. Treatment options may be combined and include:  Monitoring the blood clot with ultrasound.  Taking medicines by mouth, such as newer blood thinners (anticoagulants), thrombolytics, or warfarin.  Taking anticoagulant medicine by injection or through an IV tube.  Wearing compression stockings or using different types ofdevices.  Surgery (rare) to remove the blood clot or to place a filter in your abdomen to stop the blood clot from traveling to your lungs. Treatments for a DVT are often divided into immediate treatment and long-term treatment (up to 3 months after DVT). You can work with your health care provider to choose the treatment program that is best for you. HOME CARE INSTRUCTIONS If you are taking a newer oral anticoagulant:  Take the medicine every single day at the same time each day.  Understand what foods and drugs interact with this medicine.  Understand that there are no regular blood tests required when using this medicine.  Understand the side effects of this medicine, including excessive bruising or bleeding. Ask your health care provider or pharmacist about other possible side effects. If you are taking warfarin:  Understand how to take warfarin and know which foods can affect how warfarin works in [employer].  Understand that it is dangerous to take too much or too little warfarin. Too much warfarin increases the risk of bleeding. Too little warfarin continues to allow the risk for blood clots.  Follow your PT and INR blood testing schedule. The PT and INR results allow your health care  provider to adjust your dose of warfarin. It is very important that you have your PT and INR tested as often as told by your health care provider.  Avoid major changes in your diet, or tell your health care provider before you change your diet. Arrange a visit with a registered dietitian to answer your questions. Many foods, especially foods that are high in vitamin K, can interfere with warfarin and affect the PT and INR results. Eat a consistent amount of foods that are high in vitamin K, such as:  Spinach, kale, broccoli, cabbage, collard greens, turnip greens, Brussels sprouts, peas, cauliflower, seaweed, and parsley.  Beef liver and pork liver.  Green tea.  Soybean oil.  Tell your health care provider about any and all medicines, vitamins, and supplements that you take, including aspirin and other over-the-counter anti-inflammatory medicines. Be especially cautious with aspirin and anti-inflammatory medicines. Do not take those before you ask your health care provider if it is safe to do so. This is important because many medicines can interfere with warfarin and affect the PT and INR results.  Do not start or  stop taking any over-the-counter or prescription medicine unless your health care provider or pharmacist tells you to do so. If you take warfarin, you will also need to do these things:  Hold pressure over cuts for longer than usual.  Tell your dentist and other health care providers that you are taking warfarin before you have any procedures in which bleeding may occur.  Avoid alcohol or drink very small amounts. Tell your health care provider if you change your alcohol intake.  Do not use tobacco products, including cigarettes, chewing tobacco, and e-cigarettes. If you need help quitting, ask your health care provider.  Avoid contact sports. General Instructions  Take over-the-counter and prescription medicines only as told by your health care provider. Anticoagulant  medicines can have side effects, including easy bruising and difficulty stopping bleeding. If you are prescribed an anticoagulant, you will also need to do these things:  Hold pressure over cuts for longer than usual.  Tell your dentist and other health care providers that you are taking anticoagulants before you have any procedures in which bleeding may occur.  Avoid contact sports.  Wear a medical alert bracelet or carry a medical alert card that says you have had a PE.  Ask your health care provider how soon you can go back to your normal activities. Stay active to prevent new blood clots from forming.  Make sure to exercise while traveling or when you have been sitting or standing for a long period of time. It is very important to exercise. Exercise your legs by walking or by tightening and relaxing your leg muscles often. Take frequent walks.  Wear compression stockings as told by your health care provider to help prevent more blood clots from forming.  Do not use tobacco products, including cigarettes, chewing tobacco, and e-cigarettes. If you need help quitting, ask your health care provider.  Keep all follow-up appointments with your health care provider. This is important. PREVENTION Take these actions to decrease your risk of developing another DVT:  Exercise regularly. For at least 30 minutes every day, engage in:  Activity that involves moving your arms and legs.  Activity that encourages good blood flow through your body by increasing your heart rate.  Exercise your arms and legs every hour during long-distance travel (over 4 hours). Drink plenty of water and avoid drinking alcohol while traveling.  Avoid sitting or lying in bed for long periods of time without moving your legs.  Maintain a weight that is appropriate for your height. Ask your health care provider what weight is healthy for you.  If you are a woman who is over 79 years of age, avoid unnecessary use of  medicines that contain estrogen. These include birth control pills.  Do not smoke, especially if you take estrogen medicines. If you need help quitting, ask your health care provider. If you are hospitalized, prevention measures may include:  Early walking after surgery, as soon as your health care provider says that it is safe.  Receiving anticoagulants to prevent blood clots.If you cannot take anticoagulants, other options may be available, such as wearing compression stockings or using different types of devices. SEEK IMMEDIATE MEDICAL CARE IF:  You have new or increased pain, swelling, or redness in an arm or leg.  You have numbness or tingling in an arm or leg.  You have shortness of breath while active or at rest.  You have chest pain.  You have a rapid or irregular heartbeat.  You feel light-headed or  dizzy.  You cough up blood.  You notice blood in your vomit, bowel movement, or urine. These symptoms may represent a serious problem that is an emergency. Do not wait to see if the symptoms will go away. Get medical help right away. Call your local emergency services (911 in the [place] Do not drive yourself to the hospital.   This information is not intended to replace advice given to you by your health care provider. Make sure you discuss any questions you have with your health care provider.   Document Released: 05/06/2005 Document Revised: 01/25/2015 Document Reviewed: 08/31/2014 Elsevier Interactive Patient Education [employer]

## 2015-05-18 NOTE — ED Notes (Signed)
Pt had 500mg  tylenol PO at 1400 today

## 2015-05-18 NOTE — ED Notes (Signed)
Per EMS patient was admitted to hospital at d/c'd on 04/19/15 for DVT treatment.  Per [facility] patient now has edema in left leg.  Patient denies pain at present.

## 2015-05-18 NOTE — ED Notes (Signed)
Bed: WA06 Expected date:  Expected time:  Means of arrival:  Comments: Ems- 81 year old. Extremity swelling, hx of DVT

## 2015-05-23 ENCOUNTER — Emergency Department (HOSPITAL_COMMUNITY): Payer: Medicare Other

## 2015-05-23 ENCOUNTER — Inpatient Hospital Stay (HOSPITAL_COMMUNITY)
Admission: EM | Admit: 2015-05-23 | Discharge: 2015-05-26 | DRG: 193 | Disposition: A | Discharge status: Skilled Nursing Facility | Payer: Medicare Other | Attending: Internal Medicine | Admitting: Internal Medicine

## 2015-05-23 ENCOUNTER — Encounter (HOSPITAL_COMMUNITY): Payer: Self-pay

## 2015-05-23 DIAGNOSIS — R4182 Altered mental status, unspecified: Secondary | ICD-10-CM

## 2015-05-23 DIAGNOSIS — I482 Chronic atrial fibrillation: Secondary | ICD-10-CM | POA: Diagnosis present

## 2015-05-23 DIAGNOSIS — G309 Alzheimer's disease, unspecified: Secondary | ICD-10-CM | POA: Diagnosis present

## 2015-05-23 DIAGNOSIS — B9620 Unspecified Escherichia coli [E. coli] as the cause of diseases classified elsewhere: Secondary | ICD-10-CM | POA: Diagnosis present

## 2015-05-23 DIAGNOSIS — Z66 Do not resuscitate: Secondary | ICD-10-CM | POA: Diagnosis present

## 2015-05-23 DIAGNOSIS — J440 Chronic obstructive pulmonary disease with acute lower respiratory infection: Secondary | ICD-10-CM | POA: Diagnosis present

## 2015-05-23 DIAGNOSIS — K219 Gastro-esophageal reflux disease without esophagitis: Secondary | ICD-10-CM | POA: Diagnosis present

## 2015-05-23 DIAGNOSIS — F0280 Dementia in other diseases classified elsewhere without behavioral disturbance: Secondary | ICD-10-CM | POA: Diagnosis present

## 2015-05-23 DIAGNOSIS — N183 Chronic kidney disease, stage 3 (moderate): Secondary | ICD-10-CM | POA: Diagnosis present

## 2015-05-23 DIAGNOSIS — E870 Hyperosmolality and hypernatremia: Secondary | ICD-10-CM | POA: Diagnosis present

## 2015-05-23 DIAGNOSIS — J181 Lobar pneumonia, unspecified organism: Principal | ICD-10-CM | POA: Diagnosis present

## 2015-05-23 DIAGNOSIS — J189 Pneumonia, unspecified organism: Secondary | ICD-10-CM

## 2015-05-23 DIAGNOSIS — N390 Urinary tract infection, site not specified: Secondary | ICD-10-CM | POA: Diagnosis present

## 2015-05-23 DIAGNOSIS — Y95 Nosocomial condition: Secondary | ICD-10-CM | POA: Diagnosis present

## 2015-05-23 DIAGNOSIS — J9601 Acute respiratory failure with hypoxia: Secondary | ICD-10-CM | POA: Diagnosis present

## 2015-05-23 DIAGNOSIS — F419 Anxiety disorder, unspecified: Secondary | ICD-10-CM | POA: Diagnosis present

## 2015-05-23 DIAGNOSIS — I10 Essential (primary) hypertension: Secondary | ICD-10-CM | POA: Diagnosis not present

## 2015-05-23 DIAGNOSIS — Z79899 Other long term (current) drug therapy: Secondary | ICD-10-CM

## 2015-05-23 DIAGNOSIS — D649 Anemia, unspecified: Secondary | ICD-10-CM | POA: Diagnosis present

## 2015-05-23 DIAGNOSIS — R509 Fever, unspecified: Secondary | ICD-10-CM | POA: Diagnosis present

## 2015-05-23 DIAGNOSIS — K625 Hemorrhage of anus and rectum: Secondary | ICD-10-CM | POA: Diagnosis present

## 2015-05-23 DIAGNOSIS — J449 Chronic obstructive pulmonary disease, unspecified: Secondary | ICD-10-CM | POA: Diagnosis present

## 2015-05-23 DIAGNOSIS — I129 Hypertensive chronic kidney disease with stage 1 through stage 4 chronic kidney disease, or unspecified chronic kidney disease: Secondary | ICD-10-CM | POA: Diagnosis present

## 2015-05-23 DIAGNOSIS — Z7901 Long term (current) use of anticoagulants: Secondary | ICD-10-CM | POA: Diagnosis not present

## 2015-05-23 DIAGNOSIS — G9340 Encephalopathy, unspecified: Secondary | ICD-10-CM | POA: Diagnosis present

## 2015-05-23 DIAGNOSIS — E860 Dehydration: Secondary | ICD-10-CM | POA: Diagnosis present

## 2015-05-23 LAB — URINE MICROSCOPIC-ADD ON: RBC / HPF: NONE SEEN RBC/hpf (ref 0–5)

## 2015-05-23 LAB — BASIC METABOLIC PANEL
ANION GAP: 7 (ref 5–15)
BUN: 21 mg/dL — ABNORMAL HIGH (ref 6–20)
CHLORIDE: 108 mmol/L (ref 101–111)
CO2: 26 mmol/L (ref 22–32)
Calcium: 8.5 mg/dL — ABNORMAL LOW (ref 8.9–10.3)
Creatinine, Ser: 1.05 mg/dL — ABNORMAL HIGH (ref 0.44–1.00)
GFR calc non Af Amer: 43 mL/min — ABNORMAL LOW (ref >60)
GFR, EST AFRICAN AMERICAN: 50 mL/min — AB (ref >60)
Glucose, Bld: 127 mg/dL — ABNORMAL HIGH (ref 65–99)
POTASSIUM: 3.9 mmol/L (ref 3.5–5.1)
SODIUM: 141 mmol/L (ref 135–145)

## 2015-05-23 LAB — CBC
HEMATOCRIT: 34 % — AB (ref 36.0–46.0)
HEMOGLOBIN: 10.8 g/dL — AB (ref 12.0–15.0)
MCH: 28.6 pg (ref 26.0–34.0)
MCHC: 31.8 g/dL (ref 30.0–36.0)
MCV: 90.2 fL (ref 78.0–100.0)
PLATELETS: 251 10*3/uL (ref 150–400)
RBC: 3.77 MIL/uL — AB (ref 3.87–5.11)
RDW: 15.9 % — ABNORMAL HIGH (ref 11.5–15.5)
WBC: 6.1 10*3/uL (ref 4.0–10.5)

## 2015-05-23 LAB — URINALYSIS, ROUTINE W REFLEX MICROSCOPIC
Bilirubin Urine: NEGATIVE
Glucose, UA: NEGATIVE mg/dL
HGB URINE DIPSTICK: NEGATIVE
Ketones, ur: 15 mg/dL — AB
Nitrite: POSITIVE — AB
PH: 6 (ref 5.0–8.0)
Protein, ur: NEGATIVE mg/dL
SPECIFIC GRAVITY, URINE: 1.016 (ref 1.005–1.030)

## 2015-05-23 LAB — I-STAT CG4 LACTIC ACID, ED: LACTIC ACID, VENOUS: 1.59 mmol/L (ref 0.5–2.0)

## 2015-05-23 MED ORDER — SODIUM CHLORIDE 0.9 % IV SOLN: INTRAVENOUS | Status: DC

## 2015-05-23 MED ORDER — DEXTROSE 5 % IV SOLN
1.0000 g | Freq: Once | INTRAVENOUS | Status: AC
  Administered 2015-05-23: 1 g via INTRAVENOUS
  Filled 2015-05-23: qty 1

## 2015-05-23 MED ORDER — SODIUM CHLORIDE 0.9 % IV SOLN
INTRAVENOUS | Status: DC
  Administered 2015-05-24: 03:00:00 via INTRAVENOUS

## 2015-05-23 MED ORDER — VANCOMYCIN HCL IN DEXTROSE 750-5 MG/150ML-% IV SOLN
750.0000 mg | INTRAVENOUS | Status: DC
  Administered 2015-05-23: 750 mg via INTRAVENOUS
  Filled 2015-05-23: qty 150

## 2015-05-23 MED ORDER — SODIUM CHLORIDE 0.9 % IV BOLUS (SEPSIS)
250.0000 mL | Freq: Once | INTRAVENOUS | Status: AC
  Administered 2015-05-23: 250 mL via INTRAVENOUS

## 2015-05-23 MED ORDER — CEFTRIAXONE SODIUM 1 G IJ SOLR
1.0000 g | Freq: Once | INTRAMUSCULAR | Status: AC
  Administered 2015-05-23: 1 g via INTRAVENOUS
  Filled 2015-05-23: qty 10

## 2015-05-23 NOTE — ED Provider Notes (Signed)
CSN: [id number]     Arrival date & time 05/23/15  1900 History   First MD Initiated Contact with Patient 05/23/15 2008     Chief Complaint  Patient presents with  . Altered Mental Status  . Fever     (Consider location/radiation/quality/duration/timing/severity/associated sxs/prior Treatment) Patient is a 82 y.o. female presenting with altered mental status and fever. The history is provided by the patient, the nursing home and the EMS personnel. The history is limited by the condition of the patient.  Altered Mental Status Associated symptoms: fever   Fever  level V caveat applies due to history of significant dementia.  Patient brought in by EMS from [facility]. They reported that patient was more confused than her usual baseline she had a fever. EMS noted that she had sats in the mid 80s on room air. Patient was given Xanax before EMS was called. Patient here is drowsy but is cooperative.  Patient's initial temperature was 100. Heart rate of 71 respirations 20 blood pressure 140/45 satting 94% on room air.  Past Medical History  Diagnosis Date  . GERD (gastroesophageal reflux disease)   . Anxiety   . Dementia   . Alzheimer disease   . COPD (chronic obstructive pulmonary disease) (HCC)   . Hypertension   . Renal disorder    Past Surgical History  Procedure Laterality Date  . Joint replacement     History reviewed. No pertinent family history. Social History  Substance Use Topics  . Smoking status: Never Smoker   . Smokeless tobacco: None  . Alcohol Use: No   OB History    No data available     Review of Systems  Unable to perform ROS: Dementia  Constitutional: Positive for fever.      Allergies  Review of patient's allergies indicates no known allergies.  Home Medications   Prior to Admission medications   Medication Sig Start Date End Date Taking? Authorizing Provider  ALPRAZolam (XANAX) 0.25 MG tablet Take 1 tablet (0.25 mg total) by  mouth every 6 (six) hours as needed for anxiety (agitation). Not to exceed 6 tablets in 24 hours 04/21/15  Yes Dorothea Ogle, MD  apixaban (ELIQUIS) 2.5 MG TABS tablet Take 1 tablet (2.5 mg total) by mouth 2 (two) times daily. 04/26/15  Yes Alison Murray, MD  azelastine (OPTIVAR) 0.05 % ophthalmic solution Place 1 drop into both eyes 2 (two) times daily.   Yes Historical Provider, MD  bumetanide (BUMEX) 1 MG tablet Take 0.5 mg by mouth 2 (two) times a week. Monday and Thursday.   Yes Historical Provider, MD  citalopram (CELEXA) 10 MG tablet Take 10 mg by mouth daily.   Yes Historical Provider, MD  donepezil (ARICEPT) 10 MG tablet Take 10 mg by mouth at bedtime.   Yes Historical Provider, MD  ENSURE (ENSURE) Take 237 mLs by mouth 3 (three) times daily with meals. Mighty shakes   Yes Historical Provider, MD  esomeprazole (NEXIUM) 20 MG capsule Take 20 mg by mouth daily.    Yes Historical Provider, MD  hydrALAZINE (APRESOLINE) 10 MG tablet Take 1 tablet (10 mg total) by mouth 2 (two) times daily. 12/07/14  Yes Alison Murray, MD  hydrocerin (EUCERIN) CREA Apply 1 application topically 2 (two) times daily.   Yes Historical Provider, MD  mirtazapine (REMERON) 30 MG tablet Take 30 mg by mouth at bedtime.   Yes Historical Provider, MD  potassium chloride 20 MEQ/15ML (10%) SOLN Take 10 mEq by  mouth daily.    Yes Historical Provider, MD  risperiDONE (RISPERDAL) 0.25 MG tablet Take 1 tablet (0.25 mg total) by mouth daily. 12/07/14  Yes Alison MurrayAlma M Devine, MD  risperiDONE (RISPERDAL) 0.5 MG tablet Take 1 tablet (0.5 mg total) by mouth at bedtime. 12/07/14  Yes Alison MurrayAlma M Devine, MD  Vitamin D, Ergocalciferol, (DRISDOL) 50000 UNITS CAPS capsule Take 50,000 Units by mouth every Thursday.   Yes Historical Provider, MD  acetaminophen (TYLENOL) 500 MG tablet Take 500 mg by mouth every 6 (six) hours as needed for mild pain.    Historical Provider, MD  alum & mag hydroxide-simeth (MAALOX/MYLANTA) 200-200-20 MG/5ML suspension Take 30  mLs by mouth every 6 (six) hours as needed for indigestion or heartburn.    Historical Provider, MD  guaifenesin (ROBITUSSIN) 100 MG/5ML syrup Take 200 mg by mouth every 6 (six) hours as needed for cough.     Historical Provider, MD  loperamide (IMODIUM) 2 MG capsule Take 2 mg by mouth as needed for diarrhea or loose stools (not to exceed 8 doses in 24 hours).    Historical Provider, MD  magnesium hydroxide (MILK OF MAGNESIA) 400 MG/5ML suspension Take 30 mLs by mouth daily as needed for mild constipation.    Historical Provider, MD  Neomycin-Bacitracin-Polymyxin (TRIPLE ANTIBIOTIC EX) Apply 1 application topically. Apply after cleaning with normal saline, cover with bandaid or gauze and secure with tape. Change as needed until healed for skin tears, abrasions, or minor irritations.    Historical Provider, MD   BP 129/61 mmHg  Pulse 68  Temp(Src) 100 F (37.8 C) (Rectal)  Resp 20  SpO2 97% Physical Exam  Constitutional: She appears well-developed and well-nourished. No distress.  HENT:  Head: Normocephalic and atraumatic.  Mouth/Throat: Oropharynx is clear and moist.  Eyes: Conjunctivae and EOM are normal. Pupils are equal, round, and reactive to light.  Neck: Normal range of motion. Neck supple.  Cardiovascular: Normal rate, regular rhythm and normal heart sounds.   No murmur heard. Pulmonary/Chest: Effort normal. No respiratory distress. She has rales.  Abdominal: Soft. Bowel sounds are normal. There is no tenderness.  Musculoskeletal: Normal range of motion. She exhibits no edema.  Neurological: She is alert. No cranial nerve deficit. She exhibits normal muscle tone. Coordination normal.  Skin: Skin is warm. No rash noted.  Nursing note and vitals reviewed.   ED Course  Procedures (including critical care time) Labs Review Labs Reviewed  URINALYSIS, ROUTINE W REFLEX MICROSCOPIC (NOT AT [facility]) - Abnormal; Notable for the following:    APPearance CLOUDY (*)    Ketones, ur 15 (*)     Nitrite POSITIVE (*)    Leukocytes, UA SMALL (*)    All other components within normal limits  BASIC METABOLIC PANEL - Abnormal; Notable for the following:    Glucose, Bld 127 (*)    BUN 21 (*)    Creatinine, Ser 1.05 (*)    Calcium 8.5 (*)    GFR calc non Af Amer 43 (*)    GFR calc Af Amer 50 (*)    All other components within normal limits  CBC - Abnormal; Notable for the following:    RBC 3.77 (*)    Hemoglobin 10.8 (*)    HCT 34.0 (*)    RDW 15.9 (*)    All other components within normal limits  URINE MICROSCOPIC-ADD ON - Abnormal; Notable for the following:    Squamous Epithelial / LPF 0-5 (*)    Bacteria, UA MANY (*)  All other components within normal limits  URINE CULTURE  CULTURE, BLOOD (ROUTINE X 2)  CULTURE, BLOOD (ROUTINE X 2)  I-STAT CG4 LACTIC ACID, ED    Imaging Review Dg Chest 2 View  05/23/2015  CLINICAL DATA:  Acute onset of generalized weakness. Poorly responsive. Fever. Initial encounter. EXAM: CHEST  2 VIEW COMPARISON:  Chest radiograph performed 04/28/2015 FINDINGS: The lungs are well-aerated. Patchy right midlung and right basilar airspace opacity raises concern for pneumonia. Small bilateral pleural effusions are noted. No pneumothorax is seen. The heart is borderline enlarged. Multiple chronic left-sided rib deformities are noted. There is mild chronic deformity involving the humeral heads bilaterally, and a significant chronic compression deformity at the lower thoracic spine. No acute osseous abnormalities are seen. IMPRESSION: Patchy right midlung and right basilar airspace opacities raise concern for pneumonia. Small bilateral pleural effusions noted. Borderline cardiomegaly. Electronically Signed   By: Roanna Raider M.D.   On: 05/23/2015 21:26   Ct Head Wo Contrast  05/23/2015  CLINICAL DATA:  82 year old female with altered mental status and fever. Onset today. EXAM: CT HEAD WITHOUT CONTRAST TECHNIQUE: Contiguous axial images were obtained from the  base of the skull through the vertex without intravenous contrast. COMPARISON:  Head CT 04/28/2015 FINDINGS: Generalized atrophy and chronic small vessel ischemia, stable from prior exam.No intracranial hemorrhage, mass effect, or midline shift. No hydrocephalus. The basilar cisterns are patent. No evidence of territorial infarct. No intracranial fluid collection. Calvarium is intact. Included paranasal sinuses are well aerated. Unchanged opacification of right mastoid air cells. IMPRESSION: Stable atrophy and chronic small vessel ischemia. No acute intracranial abnormality. Electronically Signed   By: Rubye Oaks M.D.   On: 05/23/2015 21:18   I have personally reviewed and evaluated these images and lab results as part of my medical decision-making.   EKG Interpretation None      MDM   Final diagnoses:  Altered mental status, unspecified altered mental status type  HCAP (healthcare-associated pneumonia)  UTI (lower urinary tract infection)   [facility] and worsening of her baseline mental status. Patient known to have history of dementia Alzheimer's disease. During admission in November 2016 patient was a DO NOT RESUSCITATE and had a palliative care consult. Patient did not arrive from nursing facility with DO NOT RESUSCITATE paperwork. But seems appropriate that she would be a DO NOT RESUSCITATE for this admission as well.  Workup showed evidence of a urinary tract infection positive not trite. Culture sent. Chest x-ray also shows evidence of pneumonia and bilateral pleural effusions that are small. Patient had some episodes of hypoxia on room air would desat to 87%. On 2 L patient is satting fine. Lactic acid is less than 2. Patient did have report of a fever at the nursing facility highest temp ears pinna 100. Patient had blood cultures done and also received antibiotics for health care acquired pneumonia.  Discussed with the hospitalist and  patient will be admitted to a regular bed.    Vanetta Mulders, MD 05/23/15 [phone]

## 2015-05-23 NOTE — ED Notes (Signed)
[facility] is [facility] and [facility]. Patient has a history of dementia but is more altered than normal. EMS reports staff reported that when checking her sats she was in the mid 80's RA. Pt was given a Xanax before calling EMS. Responsive to pain and unaware of her exact base line alertness. EMS reports rhonchi and diminished lung sounds.

## 2015-05-23 NOTE — Progress Notes (Addendum)
ANTIBIOTIC CONSULT NOTE - INITIAL  Pharmacy Consult for Vancomycin/Cefepime Indication: PNA  No Known Allergies  Patient Measurements:   Wt=51 kg  Vital Signs: Temp: 100 F (37.8 C) (01/03 1911) Temp Source: Rectal (01/03 1911) BP: 97/85 mmHg (01/03 2152) Pulse Rate: 91 (01/03 2152) Intake/Output from previous day:   Intake/Output from this shift: Total I/O In: -  Out: 500 [Urine:500]  Labs:  Recent Labs  05/23/15 2003  WBC 6.1  HGB 10.8*  PLT 251  CREATININE 1.05*   CrCl cannot be calculated (Unknown ideal weight.). No results for input(s): VANCOTROUGH, VANCOPEAK, VANCORANDOM, GENTTROUGH, GENTPEAK, GENTRANDOM, TOBRATROUGH, TOBRAPEAK, TOBRARND, AMIKACINPEAK, AMIKACINTROU, AMIKACIN in the last 72 hours.   Microbiology: No results found for this or any previous visit (from the past 720 hour(s)).  Medical History: Past Medical History  Diagnosis Date  . GERD (gastroesophageal reflux disease)   . Anxiety   . Dementia   . Alzheimer disease   . COPD (chronic obstructive pulmonary disease) (HCC)   . Hypertension   . Renal disorder     Medications:   (Not in a hospital admission) Scheduled:  . vancomycin  750 mg Intravenous Q24H   Infusions:  . sodium chloride    . ceFEPime (MAXIPIME) IV    . sodium chloride     [name]  Vancomycin per  Rx for PNA.  Goal of Therapy:  Vancomycin trough level 15-20 mcg/ml  Plan:   Vancomycin 750mg  IV q24h  Rx adjusted Cefepime to 1Gm IV q24h  F/u Scr/cultures/levels  [name], [name] R 05/23/2015,10:55 PM

## 2015-05-23 NOTE — ED Notes (Signed)
Bed: [id number] Expected date:  Expected time:  Means of arrival:  Comments: Fever from nursing home

## 2015-05-23 NOTE — ED Notes (Addendum)
Pt has bright red color to stool upon cleaning her BM.

## 2015-05-23 NOTE — ED Notes (Signed)
Call Greer PickerelVanessa Brammer [phone]), RN w/ updates regarding admission vs. Discharge.

## 2015-05-24 ENCOUNTER — Encounter (HOSPITAL_COMMUNITY): Payer: Self-pay

## 2015-05-24 DIAGNOSIS — I10 Essential (primary) hypertension: Secondary | ICD-10-CM

## 2015-05-24 DIAGNOSIS — N3000 Acute cystitis without hematuria: Secondary | ICD-10-CM

## 2015-05-24 DIAGNOSIS — G309 Alzheimer's disease, unspecified: Secondary | ICD-10-CM

## 2015-05-24 DIAGNOSIS — F0280 Dementia in other diseases classified elsewhere without behavioral disturbance: Secondary | ICD-10-CM

## 2015-05-24 DIAGNOSIS — J189 Pneumonia, unspecified organism: Secondary | ICD-10-CM

## 2015-05-24 DIAGNOSIS — J9601 Acute respiratory failure with hypoxia: Secondary | ICD-10-CM | POA: Diagnosis present

## 2015-05-24 DIAGNOSIS — G9340 Encephalopathy, unspecified: Secondary | ICD-10-CM

## 2015-05-24 DIAGNOSIS — N390 Urinary tract infection, site not specified: Secondary | ICD-10-CM | POA: Insufficient documentation

## 2015-05-24 LAB — CBC WITH DIFFERENTIAL/PLATELET
BASOS PCT: 0 %
Basophils Absolute: 0 10*3/uL (ref 0.0–0.1)
EOS ABS: 0 10*3/uL (ref 0.0–0.7)
Eosinophils Relative: 0 %
HCT: 28.1 % — ABNORMAL LOW (ref 36.0–46.0)
Hemoglobin: 9 g/dL — ABNORMAL LOW (ref 12.0–15.0)
LYMPHS ABS: 1.1 10*3/uL (ref 0.7–4.0)
Lymphocytes Relative: 10 %
MCH: 28.8 pg (ref 26.0–34.0)
MCHC: 32 g/dL (ref 30.0–36.0)
MCV: 90.1 fL (ref 78.0–100.0)
MONO ABS: 1 10*3/uL (ref 0.1–1.0)
Monocytes Relative: 9 %
NEUTROS ABS: 9.3 10*3/uL — AB (ref 1.7–7.7)
NEUTROS PCT: 81 %
PLATELETS: 214 10*3/uL (ref 150–400)
RBC: 3.12 MIL/uL — ABNORMAL LOW (ref 3.87–5.11)
RDW: 16.2 % — AB (ref 11.5–15.5)
WBC: 11.4 10*3/uL — ABNORMAL HIGH (ref 4.0–10.5)

## 2015-05-24 LAB — BASIC METABOLIC PANEL
Anion gap: 7 (ref 5–15)
BUN: 20 mg/dL (ref 6–20)
CO2: 25 mmol/L (ref 22–32)
CREATININE: 1.26 mg/dL — AB (ref 0.44–1.00)
Calcium: 8.1 mg/dL — ABNORMAL LOW (ref 8.9–10.3)
Chloride: 111 mmol/L (ref 101–111)
GFR calc Af Amer: 40 mL/min — ABNORMAL LOW (ref >60)
GFR, EST NON AFRICAN AMERICAN: 35 mL/min — AB (ref >60)
Glucose, Bld: 96 mg/dL (ref 65–99)
Potassium: 3.8 mmol/L (ref 3.5–5.1)
SODIUM: 143 mmol/L (ref 135–145)

## 2015-05-24 LAB — HIV ANTIBODY (ROUTINE TESTING W REFLEX): HIV Screen 4th Generation wRfx: NONREACTIVE

## 2015-05-24 LAB — LACTIC ACID, PLASMA
Lactic Acid, Venous: 3.1 mmol/L (ref 0.5–2.0)
Lactic Acid, Venous: 1.4 mmol/L (ref 0.5–2.0)

## 2015-05-24 LAB — CG4 I-STAT (LACTIC ACID): Lactic Acid, Venous: 2.89 mmol/L (ref 0.5–2.0)

## 2015-05-24 MED ORDER — KETOTIFEN FUMARATE 0.025 % OP SOLN
1.0000 [drp] | Freq: Two times a day (BID) | OPHTHALMIC | Status: DC
  Administered 2015-05-24 – 2015-05-26 (×3): 1 [drp] via OPHTHALMIC
  Filled 2015-05-24: qty 5

## 2015-05-24 MED ORDER — VANCOMYCIN HCL 500 MG IV SOLR
500.0000 mg | INTRAVENOUS | Status: DC
  Administered 2015-05-24: 500 mg via INTRAVENOUS
  Filled 2015-05-24: qty 500

## 2015-05-24 MED ORDER — DONEPEZIL HCL 10 MG PO TABS
10.0000 mg | ORAL_TABLET | Freq: Every day | ORAL | Status: DC
  Administered 2015-05-25: 10 mg via ORAL
  Filled 2015-05-24 (×3): qty 1

## 2015-05-24 MED ORDER — DEXTROSE 5 % IV SOLN
1.0000 g | INTRAVENOUS | Status: DC
  Administered 2015-05-24: 1 g via INTRAVENOUS
  Filled 2015-05-24 (×2): qty 1

## 2015-05-24 MED ORDER — RISPERIDONE 0.5 MG PO TABS
0.5000 mg | ORAL_TABLET | Freq: Every day | ORAL | Status: DC
Start: 2015-05-24 — End: 2015-05-26
  Administered 2015-05-25: 0.5 mg via ORAL
  Filled 2015-05-24 (×3): qty 1

## 2015-05-24 MED ORDER — MIRTAZAPINE 30 MG PO TABS
30.0000 mg | ORAL_TABLET | Freq: Every day | ORAL | Status: DC
  Administered 2015-05-25: 30 mg via ORAL
  Filled 2015-05-24 (×3): qty 1

## 2015-05-24 MED ORDER — ACETAMINOPHEN 500 MG PO TABS: 500.0000 mg | ORAL_TABLET | Freq: Four times a day (QID) | ORAL | Status: DC | PRN

## 2015-05-24 MED ORDER — HYDRALAZINE HCL 10 MG PO TABS
10.0000 mg | ORAL_TABLET | Freq: Two times a day (BID) | ORAL | Status: DC
Start: 2015-05-24 — End: 2015-05-26
  Administered 2015-05-24 – 2015-05-26 (×4): 10 mg via ORAL
  Filled 2015-05-24 (×6): qty 1

## 2015-05-24 MED ORDER — ALUM & MAG HYDROXIDE-SIMETH 200-200-20 MG/5ML PO SUSP: 30.0000 mL | Freq: Four times a day (QID) | ORAL | Status: DC | PRN

## 2015-05-24 MED ORDER — RISPERIDONE 0.25 MG PO TABS
0.2500 mg | ORAL_TABLET | Freq: Every day | ORAL | Status: DC
  Administered 2015-05-24 – 2015-05-26 (×3): 0.25 mg via ORAL
  Filled 2015-05-24 (×3): qty 1

## 2015-05-24 MED ORDER — GUAIFENESIN 100 MG/5ML PO SOLN: 200.0000 mg | Freq: Four times a day (QID) | ORAL | Status: DC | PRN

## 2015-05-24 MED ORDER — ENSURE ENLIVE PO LIQD
237.0000 mL | Freq: Three times a day (TID) | ORAL | Status: DC
  Administered 2015-05-24 – 2015-05-26 (×3): 237 mL via ORAL

## 2015-05-24 MED ORDER — HYDROCERIN EX CREA
1.0000 "application " | TOPICAL_CREAM | Freq: Two times a day (BID) | CUTANEOUS | Status: DC
  Administered 2015-05-24 – 2015-05-26 (×5): 1 via TOPICAL
  Filled 2015-05-24: qty 113

## 2015-05-24 MED ORDER — IPRATROPIUM-ALBUTEROL 0.5-2.5 (3) MG/3ML IN SOLN
3.0000 mL | Freq: Two times a day (BID) | RESPIRATORY_TRACT | Status: DC
  Administered 2015-05-24 – 2015-05-26 (×4): 3 mL via RESPIRATORY_TRACT
  Filled 2015-05-24 (×4): qty 3

## 2015-05-24 MED ORDER — LOPERAMIDE HCL 2 MG PO CAPS: 2.0000 mg | ORAL_CAPSULE | ORAL | Status: DC | PRN

## 2015-05-24 MED ORDER — APIXABAN 2.5 MG PO TABS
2.5000 mg | ORAL_TABLET | Freq: Two times a day (BID) | ORAL | Status: DC
  Filled 2015-05-24 (×2): qty 1

## 2015-05-24 MED ORDER — CITALOPRAM HYDROBROMIDE 10 MG PO TABS
10.0000 mg | ORAL_TABLET | Freq: Every day | ORAL | Status: DC
  Administered 2015-05-24 – 2015-05-26 (×3): 10 mg via ORAL
  Filled 2015-05-24 (×3): qty 1

## 2015-05-24 MED ORDER — POTASSIUM CHLORIDE 20 MEQ/15ML (10%) PO SOLN
10.0000 meq | Freq: Every day | ORAL | Status: DC
  Administered 2015-05-24 – 2015-05-26 (×2): 10 meq via ORAL
  Filled 2015-05-24 (×3): qty 7.5

## 2015-05-24 MED ORDER — APIXABAN 2.5 MG PO TABS
2.5000 mg | ORAL_TABLET | Freq: Two times a day (BID) | ORAL | Status: DC
Start: 2015-05-25 — End: 2015-05-26
  Administered 2015-05-25 – 2015-05-26 (×2): 2.5 mg via ORAL
  Filled 2015-05-24 (×4): qty 1

## 2015-05-24 MED ORDER — SODIUM CHLORIDE 0.9 % IV SOLN
INTRAVENOUS | Status: DC
  Administered 2015-05-24: 13:00:00 via INTRAVENOUS

## 2015-05-24 MED ORDER — PANTOPRAZOLE SODIUM 40 MG PO TBEC
40.0000 mg | DELAYED_RELEASE_TABLET | Freq: Every day | ORAL | Status: DC
Start: 2015-05-24 — End: 2015-05-26
  Administered 2015-05-24 – 2015-05-26 (×3): 40 mg via ORAL
  Filled 2015-05-24 (×3): qty 1

## 2015-05-24 MED ORDER — BUMETANIDE 0.5 MG PO TABS
0.5000 mg | ORAL_TABLET | ORAL | Status: DC
  Administered 2015-05-25: 0.5 mg via ORAL
  Filled 2015-05-24: qty 1

## 2015-05-24 MED ORDER — IPRATROPIUM-ALBUTEROL 0.5-2.5 (3) MG/3ML IN SOLN
3.0000 mL | Freq: Four times a day (QID) | RESPIRATORY_TRACT | Status: DC
  Filled 2015-05-24: qty 3

## 2015-05-24 MED ORDER — MAGNESIUM HYDROXIDE 400 MG/5ML PO SUSP: 30.0000 mL | Freq: Every day | ORAL | Status: DC | PRN

## 2015-05-24 MED ORDER — ALPRAZOLAM 0.25 MG PO TABS
0.2500 mg | ORAL_TABLET | Freq: Four times a day (QID) | ORAL | Status: DC | PRN
  Administered 2015-05-24 (×2): 0.25 mg via ORAL
  Filled 2015-05-24 (×2): qty 1

## 2015-05-24 MED ORDER — VITAMIN D (ERGOCALCIFEROL) 1.25 MG (50000 UNIT) PO CAPS
50000.0000 [IU] | ORAL_CAPSULE | ORAL | Status: DC
  Administered 2015-05-25: 50000 [IU] via ORAL
  Filled 2015-05-24: qty 1

## 2015-05-24 MED ORDER — SODIUM CHLORIDE 0.9 % IV BOLUS (SEPSIS)
1000.0000 mL | INTRAVENOUS | Status: AC
  Administered 2015-05-24 (×2): 1000 mL via INTRAVENOUS

## 2015-05-24 NOTE — Progress Notes (Signed)
NS bolus #2 complete. VSS, 98.6, 124/61, 62, 16, 99% 2L. Text page to Dr. Irene LimboGoodrich.

## 2015-05-24 NOTE — Progress Notes (Addendum)
PROGRESS NOTE  [name]:[id number] DOB: 04/29/1917 DOA: 05/23/2015 PCP: Ron ParkerBOWEN,SAMUEL, MD  Summary:  82 year old woman history of COPD, dementia , nursing home resident ; presented with report of confusion, hypoxic respiratory failure, low-grade temperature. Initial evaluation suggested pneumonia , acute hypoxic respiratory failure  Assessment/Plan: 1. Acute hypoxic respiratory failure secondary to HCAP; SpO2 stable.  2.  HCAP, lobar pneumonia with fever. Lactate 1.59 on admit, >> 2.89 early AM 3. Acute encephalopathy possible; superimposed on dementia 4.  Normocytic Anemia, reported bright red blood per rectum prior to admission. Anemia near baseline. No evidence of bleeding. 5.  COPD appears stable. 6. Chronic atrial fibrillation. Although request today. 7. CKD stage III, appears at baseline 8. Abnormal U/A. Urine culture pending. 9. Alzheimer's dementia, baseline unclear   Appears clinically stable with stable vitals and mild oxygen requirement however was febrile earlier. Significance of elevated lactic acid is unclear. Her hemodynamics appear stable. Early sepsis considered but not felt to be likely based on stability.  Continue empiric antibiotics, Trend lactic acid  CBC and BMP in a.m.  Hold Eliquis today, if CBC stable morning restart  ADDENDUM Hemodynamics stable after bolus. Lactic back to normal, likely related to dehydration.  Code Status: DNR DVT prophylaxis: SCDs Family Communication: none present Disposition Plan: [facility]   Brendia Sacksaniel Goodrich, MD  [facility]  Pager [phone] If 7PM-7AM, please contact night-coverage at [email or web address], password [email or web address] 05/24/2015, 9:22 AM  LOS: 1 day   Consultants:    Procedures:    Antibiotics:  Cefepime 1/3 >>  Vancomycin 1/3 >>  HPI/Subjective: Febrile this AM. Confused. History not obtainable.   Objective: Filed Vitals:   05/24/15 0529 05/24/15 0659 05/24/15 0704 05/24/15 0854  BP:  116/38     Pulse: 67     Temp: 101 F (38.3 C)  100.4 F (38 C) 99.7 F (37.6 C)  TempSrc: Axillary  Axillary Axillary  Resp:      Height:  5' 4.17" (1.63 m)    Weight:  51.8 kg (114 lb 3.2 oz)    SpO2: 100%       Intake/Output Summary (Last 24 hours) at 05/24/15 0922 Last data filed at 05/24/15 0854  Gross per 24 hour  Intake    220 ml  Output    500 ml  Net   -280 ml     Filed Weights   05/24/15 0659  Weight: 51.8 kg (114 lb 3.2 oz)    Exam:     Fever 101, VSS General: Appears calm and comfortable; arouses to tactile stimulus Cardiovascular: RRR, no m/r/g. No LE edema. Respiratory: CTA bilaterally, no w/r/r. Normal respiratory effort. Musculoskeletal: grossly normal tone BUE/BLE Psychiatric: confused, somnolent.  Neurologic: grossly non-focal.  New data reviewed:  BMP stable  Hgb 10.8 >> 9.0; WBC 11.4  Lactic acid 2.89  Pertinent data since admission:  CXR pneumonia  Pending data:  BC  UC  Scheduled Meds: . sodium chloride   Intravenous STAT  . apixaban  2.5 mg Oral BID  . [START ON 05/25/2015] bumetanide  0.5 mg Oral Once per day on Mon Thu  . ceFEPime (MAXIPIME) IV  1 g Intravenous Q24H  . citalopram  10 mg Oral Daily  . donepezil  10 mg Oral QHS  . feeding supplement (ENSURE ENLIVE)  237 mL Oral TID WC  . hydrALAZINE  10 mg Oral BID  . hydrocerin  1 application Topical BID  . ipratropium-albuterol  3 mL Nebulization Q6H  . ketotifen  1  drop Both Eyes BID  . mirtazapine  30 mg Oral QHS  . pantoprazole  40 mg Oral Daily  . potassium chloride  10 mEq Oral Daily  . risperiDONE  0.25 mg Oral Daily  . risperiDONE  0.5 mg Oral QHS  . vancomycin  750 mg Intravenous Q24H  . [START ON 05/25/2015] Vitamin D (Ergocalciferol)  50,000 Units Oral Q Thu   Continuous Infusions: . sodium chloride      Principal Problem:   HCAP (healthcare-associated pneumonia) Active Problems:   Alzheimer's dementia   COPD (chronic obstructive pulmonary disease)  (HCC)   HTN (hypertension)   BRBPR (bright red blood per rectum)   Acute encephalopathy   Acute respiratory failure with hypoxia (HCC)   Time spent 25 minutes

## 2015-05-24 NOTE — Care Management Note (Signed)
Case Management Note  Patient Details  Name: [name] MRN: [id number] Date of Birth: 06/29/1916  Subjective/Objective:  82 y.o. F admitted 05/23/2015 for HCAP. This is a readmission for her as she was here 04/19/2015 through 04/26/2015 for DVT LLE. Pt [facility] a [facility] where she will return at discharge.                Action/Plan:CM will continue to follow for Discharge/Disposition needs.   Expected Discharge Date:                  Expected Discharge Plan:     In-House Referral:     Discharge planning Services     Post Acute Care Choice:    Choice offered to:     DME Arranged:    DME Agency:     HH Arranged:    HH Agency:     Status of Service:     Medicare Important Message Given:    Date Medicare IM Given:    Medicare IM give by:    Date Additional Medicare IM Given:    Additional Medicare Important Message give by:     If discussed at Long Length of Stay Meetings, dates discussed:    Additional Comments:  Yvone NeuCrutchfield, Mylinda Brook M, RN 05/24/2015, 9:36 AM

## 2015-05-24 NOTE — Progress Notes (Signed)
Lactic acid is 3.1. Notified Dr. Irene LimboGoodrich via text page.

## 2015-05-24 NOTE — Progress Notes (Signed)
Normal saline bolus #1 complete. VSS, 98.9, 129/46, 100% on 2L, 63, 14 RR. Dr. Irene LimboGoodrich notified.

## 2015-05-24 NOTE — Evaluation (Signed)
Clinical/Bedside Swallow Evaluation Patient Details  Name: [name] MRN: [id number] Date of Birth: 09/08/1916  Today's Date: 05/24/2015 Time: SLP Start Time (ACUTE ONLY): 1535 SLP Stop Time (ACUTE ONLY): 1601 SLP Time Calculation (min) (ACUTE ONLY): 26 min  Past Medical History:  Past Medical History  Diagnosis Date  . GERD (gastroesophageal reflux disease)   . Anxiety   . Dementia   . Alzheimer disease   . COPD (chronic obstructive pulmonary disease) (HCC)   . Hypertension   . Renal disorder    Past Surgical History:  Past Surgical History  Procedure Laterality Date  . Joint replacement     HPI:  82 yo [facility].  PMH + dementia, CKD, anemia - She resides at SNF- right middle and lower lobe opacities, concern for pna noted on CXR with this admit.  Pt has had six falls in one year.  RN report pt orally holding pills despite being cued to swallow and despite puree mixture provided. Advised RN to consider crushed medicine with icecream.     Assessment / Plan / Recommendation Clinical Impression  Pt presents with cognitive based oral dysphagia characterized by oral holding/delayed transiting and vertical mastication pattern.  Suspect pharyngeal swallow to be strong as there were no indications of residuals.  Pt with audible and more timely swallowing consuming liquids.  She benefits from verbal cues to swallow with solids/icecream.  Pt clearly indicated she did not desire applesauce by turning her head and sealing lips tightly.  Suspect if pt aspirated, it was due to delay in oral swallow and reliance on others for feeding.  Recommend to modify diet to aid effiency of swallow.  Will follow up x1 to educate family and assure pt is tolerating po well.  Thanks for this consult.     Aspiration Risk  Moderate aspiration risk    Diet Recommendation Dysphagia 3 (Mech soft);Thin liquid   Liquid Administration via: Straw Medication Administration: Crushed with  puree Supervision: Staff to assist with self feeding (pt can hold her own cup) Compensations: Small sips/bites;Slow rate;Other (Comment) (observe pt to swallow before giving more and cue to swallow as needed, check for pocketing) Postural Changes: Remain upright for at least 30 minutes after po intake;Seated upright at 90 degrees    Other  Recommendations Oral Care Recommendations: Oral care QID   Follow up Recommendations  Skilled Nursing facility    Frequency and Duration min 1 x/week  1 week       Prognosis Prognosis for Safe Diet Advancement: Good Barriers to Reach Goals: Cognitive deficits      Swallow Study   General Date of Onset: 05/24/15 HPI: 82 [facility].  PMH + dementia, CKD, anemia - She resides at SNF- right middle and lower lobe opacities, concern for pna noted on CXR with this admit.  Pt has had six falls in one year.  RN report pt orally holding pills despite being cued to swallow and despite puree mixture provided. Advised RN to consider crushed medicine with icecream.   Type of Study: Bedside Swallow Evaluation Previous Swallow Assessment: none in epic Diet Prior to this Study: Regular;Thin liquids Temperature Spikes Noted: No Respiratory Status: Nasal cannula Behavior/Cognition: Alert;Confused;Doesn't follow directions;Distractible Oral Cavity Assessment: Within Functional Limits (limited view due to pt not opening oral cavity adequately) Oral Cavity - Dentition:  (few dentition) Vision: Impaired for self-feeding Self-Feeding Abilities: Total assist Patient Positioning: Upright in bed Baseline Vocal Quality: Low vocal intensity;Hoarse  Volitional Cough: Strong Volitional Swallow: Unable to elicit    Oral/Motor/Sensory Function Overall Oral Motor/Sensory Function: Generalized oral weakness (pt did not follow directions for OME, able to seal lips on straw, spoon)   Ice Chips Ice chips: Not tested   Thin Liquid Thin Liquid:  Impaired Presentation: Straw Pharyngeal  Phase Impairments: Suspected delayed Swallow;Cough - Immediate Other Comments: cough x1 with sequential boluses of thin, small single boluses tolerated better    Nectar Thick Nectar Thick Liquid: Not tested   Honey Thick Honey Thick Liquid: Not tested   Puree Puree: Impaired Presentation: Spoon;Self Fed Oral Phase Functional Implications: Prolonged oral transit Pharyngeal Phase Impairments: Suspected delayed Swallow Other Comments: pt benefited from verbal cues to swallow - observation of laryngeal elevation to assure swallows helpful   Solid   GO    Solid: Impaired Oral Phase Impairments: Reduced lingual movement/coordination;Impaired mastication;Poor awareness of bolus (vertical mastication pattern - with adequate clearance ) Oral Phase Functional Implications: Impaired mastication;Prolonged oral transit Pharyngeal Phase Impairments: Suspected delayed Swallow Other Comments: cues to swallow - suspect pharyngeal swallow is strong       Mills Koller, MS [facility] SLP [phone]

## 2015-05-24 NOTE — Clinical Social Work Note (Signed)
Clinical Social Work Assessment  Patient Details  Name: [name] MRN: [id number] Date of Birth: 10/15/1916  Date of referral:  05/24/15               Reason for consult:  Facility Placement, Discharge Planning                Permission sought to share information with:  Facility Industrial/product designerContact Representative Permission granted to share information::  Yes, Verbal Permission Granted  Name::        Agency::     Relationship::     Contact Information:     Housing/Transportation Living arrangements for the past 2 months:  Assisted Living Facility Source of Information:  Adult Children Patient Interpreter Needed:  None Criminal Activity/Legal Involvement Pertinent to Current Situation/Hospitalization:  No - Comment as needed Significant Relationships:  Adult Children Lives with:  Facility Resident Do you feel safe going back to the place where you live?  Yes Need for family participation in patient care:  Yes (Comment)  Care giving concerns: No concerns reported by son.   Social Worker assessment / plan:  Pt hospitalized on 05/23/15 with HCAP. [facility]. She is oriented x1 and unable to assist with d/c planning. Pt was sleeping when CSW attempted to visit at bedside. CSW has contacted pt's son, [name], [phone], to offer assistance with d/c planning. [name] would [facility]. Pt was recently hospitalized form 04/19/15-04/26/15 and [facility]. [facility] and clinicals sent for review. CSW will continue to follow to assist with d/c back to memory care unit when stable.  Employment status:  Retired Database administratornsurance information:  [place] Medicare, [place] PT Recommendations:  Not assessed at this time Information / Referral to community resources:     Patient/Family's Response to care:  Family would like pt to return to [facility] at d/c.  Patient/Family's  Understanding of and Emotional Response to Diagnosis, Current Treatment, and Prognosis: Son is aware pt has been admitted for HCAP. He would like to be kept updated on pt's medical status.   Emotional Assessment Appearance:  Appears stated age Attitude/Demeanor/Rapport:  Unable to Assess Affect (typically observed):  Unable to Assess Orientation:  Oriented to Self Alcohol / Substance use:  Not Applicable Psych involvement (Current and /or in the community):  No (Comment)  Discharge Needs  Concerns to be addressed:  Discharge Planning Concerns Readmission within the last 30 days:  Yes Current discharge risk:  None Barriers to Discharge:  No Barriers Identified   Sherri AsalHaidinger, Sherri Wiest Lee, LCSW  [phone] 05/24/2015, 11:32 AM

## 2015-05-24 NOTE — Progress Notes (Signed)
Pharmacy - Brief Note (vancomycin dosing)  See pharmacist's note from this morning for details  Plan: -Based on updated weight and CrCl, change vancomycin to 500mg  IV q24h.  Further adjustments may be required if renal function worsens.  Juliette Alcideustin Zeigler, PharmD, BCPS.   Pager: [phone] 05/24/2015 11:04 AM

## 2015-05-24 NOTE — H&P (Signed)
[facility] History and Physical  [name] ZOX:[id number] DOB: [birth year] DOA: 05/23/2015  Referring physician:ED PCP: Ron Parker, MD   Chief Complaint: Fever and altered mental status  HPI:  [name] is a 82 year old female with a past medical history significant for dementia, COPD, hypertension, GERD; who presents with altered mental status and fever from [facility]. Patient had initially been reported to be more confused than her baseline O2 sats were initially seen to be in the mid 80s on room air all with a temperature 100F. Upon admission patient was seen have HR 71, RR 20,  BP 140/45, and O2 sats 94% on room air. Workup including chest x-ray show positive signs of patchy right middle lobe opacities questioning for pneumonia. Urinanalysis was positive for bacteria and nitrates.     Review of Systems  Unable to perform ROS: acuity of condition       Past Medical History  Diagnosis Date  . GERD (gastroesophageal reflux disease)   . Anxiety   . Dementia   . Alzheimer disease   . COPD (chronic obstructive pulmonary disease) (HCC)   . Hypertension   . Renal disorder      Past Surgical History  Procedure Laterality Date  . Joint replacement        Social History:  reports that she has never smoked. She does not have any smokeless tobacco history on file. She reports that she does not drink alcohol or use illicit drugs. Where does patient live- SNF? and with whom if at home? Can patient participate in ADLs? Needs assistance  No Known Allergies  History reviewed. No pertinent family history.     Prior to Admission medications   Medication Sig Start Date End Date Taking? Authorizing Provider  ALPRAZolam (XANAX) 0.25 MG tablet Take 1 tablet (0.25 mg total) by mouth every 6 (six) hours as needed for anxiety (agitation). Not to exceed 6 tablets in 24 hours 04/21/15  Yes Dorothea Ogle, MD  apixaban (ELIQUIS) 2.5 MG TABS tablet Take 1  tablet (2.5 mg total) by mouth 2 (two) times daily. 04/26/15  Yes Alison Murray, MD  azelastine (OPTIVAR) 0.05 % ophthalmic solution Place 1 drop into both eyes 2 (two) times daily.   Yes Historical Provider, MD  bumetanide (BUMEX) 1 MG tablet Take 0.5 mg by mouth 2 (two) times a week. Monday and Thursday.   Yes Historical Provider, MD  citalopram (CELEXA) 10 MG tablet Take 10 mg by mouth daily.   Yes Historical Provider, MD  donepezil (ARICEPT) 10 MG tablet Take 10 mg by mouth at bedtime.   Yes Historical Provider, MD  ENSURE (ENSURE) Take 237 mLs by mouth 3 (three) times daily with meals. Mighty shakes   Yes Historical Provider, MD  esomeprazole (NEXIUM) 20 MG capsule Take 20 mg by mouth daily.    Yes Historical Provider, MD  hydrALAZINE (APRESOLINE) 10 MG tablet Take 1 tablet (10 mg total) by mouth 2 (two) times daily. 12/07/14  Yes Alison Murray, MD  hydrocerin (EUCERIN) CREA Apply 1 application topically 2 (two) times daily.   Yes Historical Provider, MD  mirtazapine (REMERON) 30 MG tablet Take 30 mg by mouth at bedtime.   Yes Historical Provider, MD  potassium chloride 20 MEQ/15ML (10%) SOLN Take 10 mEq by mouth daily.    Yes Historical Provider, MD  risperiDONE (RISPERDAL) 0.25 MG tablet Take 1 tablet (0.25 mg total) by mouth daily. 12/07/14  Yes Alison Murray, MD  risperiDONE (RISPERDAL) 0.5  MG tablet Take 1 tablet (0.5 mg total) by mouth at bedtime. 12/07/14  Yes Alison Murray, MD  Vitamin D, Ergocalciferol, (DRISDOL) 50000 UNITS CAPS capsule Take 50,000 Units by mouth every Thursday.   Yes Historical Provider, MD  acetaminophen (TYLENOL) 500 MG tablet Take 500 mg by mouth every 6 (six) hours as needed for mild pain.    Historical Provider, MD  alum & mag hydroxide-simeth (MAALOX/MYLANTA) 200-200-20 MG/5ML suspension Take 30 mLs by mouth every 6 (six) hours as needed for indigestion or heartburn.    Historical Provider, MD  guaifenesin (ROBITUSSIN) 100 MG/5ML syrup Take 200 mg by mouth every 6  (six) hours as needed for cough.     Historical Provider, MD  loperamide (IMODIUM) 2 MG capsule Take 2 mg by mouth as needed for diarrhea or loose stools (not to exceed 8 doses in 24 hours).    Historical Provider, MD  magnesium hydroxide (MILK OF MAGNESIA) 400 MG/5ML suspension Take 30 mLs by mouth daily as needed for mild constipation.    Historical Provider, MD  Neomycin-Bacitracin-Polymyxin (TRIPLE ANTIBIOTIC EX) Apply 1 application topically. Apply after cleaning with normal saline, cover with bandaid or gauze and secure with tape. Change as needed until healed for skin tears, abrasions, or minor irritations.    Historical Provider, MD     Physical Exam: Filed Vitals:   05/23/15 2152 05/23/15 2319 05/24/15 0131 05/24/15 0529  BP: 97/85 129/61 133/32 116/38  Pulse: 91 68 67 67  Temp:   98.6 F (37 C) 101 F (38.3 C)  TempSrc:   Axillary Axillary  Resp: 32 20    SpO2: 91% 97% 100% 100%     Constitutional: Vital signs reviewed. Elderly female who appears altered not able to follow simple commands, but make movements with verbal stimuli. Head: Normocephalic and atraumatic  Ear: TM normal bilaterally  Mouth: no erythema or exudates, dry mucous membranes  Eyes: PERRL, EOMI, conjunctivae normal, No scleral icterus.  Neck: Supple, Trachea midline normal ROM, No JVD, mass, thyromegaly, or carotid bruit present.  Cardiovascular: RRR, S1 normal, S2 normal, no MRG, pulses symmetric and intact bilaterally  Pulmonary/Chest: Decreased overall air movement with positive rales Abdominal: Soft. Non-tender, non-distended, bowel sounds are normal, no masses, organomegaly, or guarding present.  GU: no CVA tenderness Musculoskeletal: No joint deformities, erythema, or stiffness, ROM full and no nontender Ext: no edema and no cyanosis, pulses palpable bilaterally (DP and PT)  Hematology: no cervical, inginal, or axillary adenopathy.  Neurological: Able to move all extremities with stimuli Skin: Warm,  dry and intact. No rash, cyanosis, or clubbing.  Psychiatric: Normal mood and affect. speech and behavior is normal. Judgment and thought content normal. Cognition and memory are normal.      Data Review   Micro Results No results found for this or any previous visit (from the past 240 hour(s)).  Radiology Reports Dg Chest 1 View  04/28/2015  CLINICAL DATA:  82 year old female with fall EXAM: CHEST 1 VIEW COMPARISON:  Chest radiograph dated 12/05/2014 FINDINGS: Single-view of the chest demonstrates emphysematous changes of the lungs. There is no focal consolidation, pleural effusion, or pneumothorax. Right apical pleural thickening/scarring noted similar to the study dated 12/05/2014. Stable cardiac silhouette. The bones are osteopenic. Old healed right mid clavicular fracture. No definite acute fracture identified. IMPRESSION: No active disease. Electronically Signed   By: Elgie Collard M.D.   On: 04/28/2015 06:34   Dg Chest 2 View  05/23/2015  CLINICAL DATA:  Acute onset of  generalized weakness. Poorly responsive. Fever. Initial encounter. EXAM: CHEST  2 VIEW COMPARISON:  Chest radiograph performed 04/28/2015 FINDINGS: The lungs are well-aerated. Patchy right midlung and right basilar airspace opacity raises concern for pneumonia. Small bilateral pleural effusions are noted. No pneumothorax is seen. The heart is borderline enlarged. Multiple chronic left-sided rib deformities are noted. There is mild chronic deformity involving the humeral heads bilaterally, and a significant chronic compression deformity at the lower thoracic spine. No acute osseous abnormalities are seen. IMPRESSION: Patchy right midlung and right basilar airspace opacities raise concern for pneumonia. Small bilateral pleural effusions noted. Borderline cardiomegaly. Electronically Signed   By: Roanna Raider M.D.   On: 05/23/2015 21:26   Dg Pelvis 1-2 Views  04/28/2015  CLINICAL DATA:  82 year old female with fall EXAM:  PELVIS - 1-2 VIEW COMPARISON:  CT of the abdomen pelvis dated 03/09/2014 FINDINGS: There is osteopenia. There is total left hip arthroplasty. No definite acute fracture. No dislocation. There is degenerative changes of the lower lumbar spine. Dense stool noted within the rectum may represent a degree of fecal impaction. IMPRESSION: No definite acute fracture or dislocation. Electronically Signed   By: Elgie Collard M.D.   On: 04/28/2015 06:32   Ct Head Wo Contrast  05/23/2015  CLINICAL DATA:  82 year old female with altered mental status and fever. Onset today. EXAM: CT HEAD WITHOUT CONTRAST TECHNIQUE: Contiguous axial images were obtained from the base of the skull through the vertex without intravenous contrast. COMPARISON:  Head CT 04/28/2015 FINDINGS: Generalized atrophy and chronic small vessel ischemia, stable from prior exam.No intracranial hemorrhage, mass effect, or midline shift. No hydrocephalus. The basilar cisterns are patent. No evidence of territorial infarct. No intracranial fluid collection. Calvarium is intact. Included paranasal sinuses are well aerated. Unchanged opacification of right mastoid air cells. IMPRESSION: Stable atrophy and chronic small vessel ischemia. No acute intracranial abnormality. Electronically Signed   By: Rubye Oaks M.D.   On: 05/23/2015 21:18   Ct Head Wo Contrast  04/28/2015  CLINICAL DATA:  82 year old female found on the floor. Patient complaining of neck and head pain EXAM: CT HEAD WITHOUT CONTRAST CT CERVICAL SPINE WITHOUT CONTRAST TECHNIQUE: Multidetector CT imaging of the head and cervical spine was performed following the standard protocol without intravenous contrast. Multiplanar CT image reconstructions of the cervical spine were also generated. COMPARISON:  CT dated 02/26/2015 FINDINGS: CT HEAD FINDINGS The ventricles are dilated and the sulci are prominent compatible with age-related atrophy. Periventricular and deep white matter hypodensities  represent chronic microvascular ischemic changes. There is no intracranial hemorrhage. No mass effect or midline shift identified. There is partial opacification of the right mastoid air cells. The visualized of the paranasal sinuses and left mastoid air cell are well aerated. The calvarium is intact. CT CERVICAL SPINE FINDINGS No definite acute fracture. There is extensive osteopenia with degenerative changes of the spine. Grade 1 C3-C4 retrolisthesis. The odontoid and spinous processes are intact.There is normal anatomic alignment of the C1-C2 lateral masses. The visualized soft tissues appear unremarkable. Biapical scarring noted. IMPRESSION: No acute intracranial hemorrhage. Age-related atrophy and chronic microvascular ischemic disease. No definite acute/traumatic cervical spine pathology. Electronically Signed   By: Elgie Collard M.D.   On: 04/28/2015 06:09   Ct Cervical Spine Wo Contrast  04/28/2015  CLINICAL DATA:  82 year old female found on the floor. Patient complaining of neck and head pain EXAM: CT HEAD WITHOUT CONTRAST CT CERVICAL SPINE WITHOUT CONTRAST TECHNIQUE: Multidetector CT imaging of the head and cervical spine was  performed following the standard protocol without intravenous contrast. Multiplanar CT image reconstructions of the cervical spine were also generated. COMPARISON:  CT dated 02/26/2015 FINDINGS: CT HEAD FINDINGS The ventricles are dilated and the sulci are prominent compatible with age-related atrophy. Periventricular and deep white matter hypodensities represent chronic microvascular ischemic changes. There is no intracranial hemorrhage. No mass effect or midline shift identified. There is partial opacification of the right mastoid air cells. The visualized of the paranasal sinuses and left mastoid air cell are well aerated. The calvarium is intact. CT CERVICAL SPINE FINDINGS No definite acute fracture. There is extensive osteopenia with degenerative changes of the spine.  Grade 1 C3-C4 retrolisthesis. The odontoid and spinous processes are intact.There is normal anatomic alignment of the C1-C2 lateral masses. The visualized soft tissues appear unremarkable. Biapical scarring noted. IMPRESSION: No acute intracranial hemorrhage. Age-related atrophy and chronic microvascular ischemic disease. No definite acute/traumatic cervical spine pathology. Electronically Signed   By: Elgie CollardArash  Radparvar M.D.   On: 04/28/2015 06:09     CBC  Recent Labs Lab 05/18/15 1846 05/23/15 2003  WBC 7.5 6.1  HGB 11.0* 10.8*  HCT 35.1* 34.0*  PLT 239 251  MCV 89.1 90.2  MCH 27.9 28.6  MCHC 31.3 31.8  RDW 15.9* 15.9*  LYMPHSABS 1.1  --   MONOABS 0.6  --   EOSABS 0.1  --   BASOSABS 0.0  --     Chemistries   Recent Labs Lab 05/18/15 1846 05/23/15 2003  NA 142 141  K 4.0 3.9  CL 106 108  CO2 26 26  GLUCOSE 102* 127*  BUN 21* 21*  CREATININE 1.14* 1.05*  CALCIUM 8.9 8.5*  AST 18  --   ALT 13*  --   ALKPHOS 68  --   BILITOT 0.4  --    ------------------------------------------------------------------------------------------------------------------ CrCl cannot be calculated (Unknown ideal weight.). ------------------------------------------------------------------------------------------------------------------ No results for input(s): HGBA1C in the last 72 hours. ------------------------------------------------------------------------------------------------------------------ No results for input(s): CHOL, HDL, LDLCALC, TRIG, CHOLHDL, LDLDIRECT in the last 72 hours. ------------------------------------------------------------------------------------------------------------------ No results for input(s): TSH, T4TOTAL, T3FREE, THYROIDAB in the last 72 hours.  Invalid input(s): FREET3 ------------------------------------------------------------------------------------------------------------------ No results for input(s): VITAMINB12, FOLATE, FERRITIN, TIBC, IRON,  RETICCTPCT in the last 72 hours.  Coagulation profile No results for input(s): INR, PROTIME in the last 168 hours.  No results for input(s): DDIMER in the last 72 hours.  Cardiac Enzymes No results for input(s): CKMB, TROPONINI, MYOGLOBIN in the last 168 hours.  Invalid input(s): CK ------------------------------------------------------------------------------------------------------------------ Invalid input(s): POCBNP   CBG: No results for input(s): GLUCAP in the last 168 hours.     EKG: Independently reviewed. Atrial fibrillation   Assessment/Plan Principal Problem:  HCAP (healthcare-associated pneumonia): Patient with fever reported of 100F and Chest x-ray showing the right middle lobe opacities suggestive of acute pneumonia. Due to patient being a skill nursing facility will start on healthcare associated coverage -  Follow up blood and Sputum cultures -  Empiric antibiotics of cefepime and vancomycin per pharmacy -  Duonebs -  lactic acid  Acute encephalopathyAlzheimer's dementia: Patient's baseline is unknown at this time - Continue donepezil - neuro checks  Chronic A. Fib: Appears rate controlled with initial heart rates less than 110 on admission - Continue Eliquis    HTN (hypertension) - Continue hydralazine, Bumex  UTI (urinary tract infection): UA positive for nitrates and many bacteria - Follow-up urine culture - Antibiotics as seen above  Anemia: Report of bright red blood per rectum on admission. - Checking CBC now  Code Status:  DNR Family Communication: bedside Disposition Plan: admit   Total time spent 55 minutes.Greater than 50% of this time was spent in counseling, explanation of diagnosis, planning of further management, and coordination of care  Clydie Braun [facility] Pager [phone]  If 7PM-7AM, please contact night-coverage [email or web address] Password [facility] 05/24/2015, 6:48 AM

## 2015-05-24 NOTE — Progress Notes (Signed)
Patient is incontinent. Unable to collect strep pneumoniae urinary antigen at this time. No productive cough noted to obtain culture of sputum.

## 2015-05-25 DIAGNOSIS — J438 Other emphysema: Secondary | ICD-10-CM

## 2015-05-25 DIAGNOSIS — I482 Chronic atrial fibrillation: Secondary | ICD-10-CM

## 2015-05-25 LAB — CBC
HEMATOCRIT: 33.9 % — AB (ref 36.0–46.0)
HEMOGLOBIN: 10.8 g/dL — AB (ref 12.0–15.0)
MCH: 28.9 pg (ref 26.0–34.0)
MCHC: 31.9 g/dL (ref 30.0–36.0)
MCV: 90.6 fL (ref 78.0–100.0)
PLATELETS: 263 10*3/uL (ref 150–400)
RBC: 3.74 MIL/uL — AB (ref 3.87–5.11)
RDW: 16.4 % — ABNORMAL HIGH (ref 11.5–15.5)
WBC: 11.8 10*3/uL — AB (ref 4.0–10.5)

## 2015-05-25 LAB — BASIC METABOLIC PANEL
ANION GAP: 8 (ref 5–15)
BUN: 15 mg/dL (ref 6–20)
CHLORIDE: 112 mmol/L — AB (ref 101–111)
CO2: 25 mmol/L (ref 22–32)
Calcium: 8.8 mg/dL — ABNORMAL LOW (ref 8.9–10.3)
Creatinine, Ser: 1.07 mg/dL — ABNORMAL HIGH (ref 0.44–1.00)
GFR calc Af Amer: 49 mL/min — ABNORMAL LOW (ref >60)
GFR, EST NON AFRICAN AMERICAN: 42 mL/min — AB (ref >60)
GLUCOSE: 102 mg/dL — AB (ref 65–99)
POTASSIUM: 3.3 mmol/L — AB (ref 3.5–5.1)
Sodium: 145 mmol/L (ref 135–145)

## 2015-05-25 MED ORDER — AMOXICILLIN-POT CLAVULANATE 500-125 MG PO TABS
1.0000 | ORAL_TABLET | Freq: Two times a day (BID) | ORAL | Status: DC
  Administered 2015-05-25: 500 mg via ORAL
  Filled 2015-05-25 (×4): qty 1

## 2015-05-25 MED ORDER — POTASSIUM CHLORIDE CRYS ER 20 MEQ PO TBCR
40.0000 meq | EXTENDED_RELEASE_TABLET | ORAL | Status: AC
  Administered 2015-05-25: 40 meq via ORAL
  Filled 2015-05-25: qty 2

## 2015-05-25 MED ORDER — SODIUM CHLORIDE 0.9 % IV SOLN
INTRAVENOUS | Status: DC
Start: 2015-05-25 — End: 2015-05-26
  Administered 2015-05-25: 12:00:00 via INTRAVENOUS

## 2015-05-25 NOTE — Progress Notes (Signed)
PT Cancellation Note  Patient Details Name: [name] MRN: [id number] DOB: 11/30/1916   Cancelled Treatment:    Reason Eval/Treat Not Completed: PT screened, no needs identified, will sign off   Sharen HeckHill, [place] Elizabeth Delon Revelo PT [phone]  05/25/2015, 1:02 PM

## 2015-05-25 NOTE — Progress Notes (Signed)
PROGRESS NOTE  [name] NFA:[id number] DOB: Apr 12, 1917 DOA: 05/23/2015 PCP: Ron Parker, MD  Summary:  82 year old woman history of COPD, dementia , nursing home resident ; presented with report of confusion, hypoxic respiratory failure, low-grade temperature. Initial evaluation suggested pneumonia , acute hypoxic respiratory failure and changes on mentation.   Assessment/Plan: 1. Acute hypoxic respiratory failure secondary to HCAP; SpO2 stable and on RA today.  2. HCAP, lobar pneumonia with fever. Lactate 1.59 on admit and >> 2.89 on 1/4; lactic normal now after IVF's. Patient is hemodynamically stable and non toxic on exam. Antibiotics transitioned to PO Augmentin. 3. Acute encephalopathy possible; superimposed on dementia, due to low oxygenation and PNA. 4. Normocytic Anemia, reported bright red blood per rectum prior to admission. Anemia near baseline. No evidence of bleeding appreciated. Hgb stable. Will continue eliquis and monitor for bleeding  5. COPD appears stable. No wheezing  6. Chronic atrial fibrillation. Rate controlled. Will continue eliquis  7. CKD stage III, slightly off from baseline, most likely due to dehydration. Will continue hydration 8. Abnormal U/A. Urine culture still pending. 9. Alzheimer's dementia, baseline unclear   Appears clinically stable; no fever today. Will follow clinical response and oxygen needs; currently good O2 sat on RA. Will transition abx's to PO. Lactic acid is normal after IVF's. Still mildly dehydrated. Will provided 1L over the next 13 hours and reassess volume status and labs. Hopefully ready for discharge in am.  Antibiotics will be narrow and transitioned to PO Augmentin  CBC and BMP in a.m.  Will resume Eliquis. CBC/Hgb stable.   Code Status: DNR DVT prophylaxis: SCDs Family Communication: none present Disposition Plan: return to [facility] when stable; hopefully in the next 24 hours. Electrolytes still off, signs of mild  dehydration; attempting transition on abx's to PO. Will assess tolerance.   Vassie Loll Pager [phone]   If 7PM-7AM, please contact night-coverage at [email or web address], password [facility] 05/25/2015, 9:44 AM  LOS: 2 days   Consultants:  SPL (recommending dysphagia 3 diet   Procedures:  See below for x-ray reports   Antibiotics:  Cefepime 1/3 >>05/25/15  Vancomycin 1/3 >>5/17  Augmentin 05/25/15>>>  HPI/Subjective: Patient w/o fever today. Denies any CP, nausea, vomiting or other complaints. Good O2 sat on RA.   Objective: Filed Vitals:   05/25/15 0045 05/25/15 0211 05/25/15 0435 05/25/15 0600  BP: 139/72 162/69 157/70 150/68  Pulse: 90 97 90 94  Temp: 98.2 F (36.8 C) 98.4 F (36.9 C) 98 F (36.7 C) 98.4 F (36.9 C)  TempSrc: Oral Oral Oral Oral  Resp: 14 14 15 14   Height:      Weight:      SpO2: 93% 94% 90% 92%    Intake/Output Summary (Last 24 hours) at 05/25/15 0944 Last data filed at 05/24/15 2202  Gross per 24 hour  Intake    350 ml  Output      0 ml  Net    350 ml     Filed Weights   05/24/15 0659  Weight: 51.8 kg (114 lb 3.2 oz)    Exam:     VSS; currently afebrile and with good O2 sat on RA. Denies any pain General: Appears calm and comfortable; arouses to tactile and verbal stimulus Cardiovascular: RRR, no m/r/g. No LE edema. Respiratory: Normal respiratory effort; no wheezing or crackles. Positive scattered rhonchi  Musculoskeletal: grossly normal tone BUE/BLE Psychiatric: confused and oriented only to person; unknown baseline  Neurologic: grossly non-focal.  New data reviewed:  BMP stable  Hgb 10.8 >> 9.0; WBC 11.4  Lactic acid 2.89  Pertinent data since admission:  CXR pneumonia  Pending data:  BC  UC  Scheduled Meds: . amoxicillin-clavulanate  1 tablet Oral Q12H  . apixaban  2.5 mg Oral BID  . bumetanide  0.5 mg Oral Once per day on Mon Thu  . citalopram  10 mg Oral Daily  . donepezil  10 mg Oral QHS  . feeding supplement  (ENSURE ENLIVE)  237 mL Oral TID WC  . hydrALAZINE  10 mg Oral BID  . hydrocerin  1 application Topical BID  . ipratropium-albuterol  3 mL Nebulization BID  . ketotifen  1 drop Both Eyes BID  . mirtazapine  30 mg Oral QHS  . pantoprazole  40 mg Oral Daily  . potassium chloride  10 mEq Oral Daily  . potassium chloride  40 mEq Oral Q4H  . risperiDONE  0.25 mg Oral Daily  . risperiDONE  0.5 mg Oral QHS  . Vitamin D (Ergocalciferol)  50,000 Units Oral Q Thu   Continuous Infusions: . sodium chloride      Principal Problem:   HCAP (healthcare-associated pneumonia) Active Problems:   Alzheimer's dementia   COPD (chronic obstructive pulmonary disease) (HCC)   HTN (hypertension)   BRBPR (bright red blood per rectum)   Acute encephalopathy   Acute respiratory failure with hypoxia (HCC)   Time spent 25 minutes

## 2015-05-25 NOTE — Progress Notes (Signed)
NUTRITION NOTE  [facility]. She reports that pt/pt's family states that pt needs very soft items such as mashed potatoes, pudding, or other items that she does not have to chew before swallowing. Current diet order is Dysphagia 3. No SLP note in chart since admission. Will downgrade to Dysphagia 1 for improved tolerance.     Trenton GammonJessica Keiandre Cygan, RD, LDN Inpatient Clinical Dietitian Pager # [phone] After hours/weekend pager # [phone]

## 2015-05-25 NOTE — Progress Notes (Signed)
PT Cancellation Note  Patient Details Name: [name] MRN: [id number] DOB: 01/10/1917   Cancelled Treatment:    Reason Eval/Treat Not Completed: PT screened, no needs identified, MSW will advise if PT eval needed but per EPIC, PT has not been involved in recent hospitalizations; therefore , doubt PT will be involved and will sign off today.   [name], [name] Elizabeth 05/25/2015, 9:12 AM Blanchard KelchKaren Lucienne Sawyers PT [phone]

## 2015-05-26 DIAGNOSIS — K625 Hemorrhage of anus and rectum: Secondary | ICD-10-CM

## 2015-05-26 DIAGNOSIS — B9620 Unspecified Escherichia coli [E. coli] as the cause of diseases classified elsewhere: Secondary | ICD-10-CM

## 2015-05-26 DIAGNOSIS — N390 Urinary tract infection, site not specified: Secondary | ICD-10-CM

## 2015-05-26 LAB — CBC
HEMATOCRIT: 30.1 % — AB (ref 36.0–46.0)
HEMOGLOBIN: 9.5 g/dL — AB (ref 12.0–15.0)
MCH: 28.8 pg (ref 26.0–34.0)
MCHC: 31.6 g/dL (ref 30.0–36.0)
MCV: 91.2 fL (ref 78.0–100.0)
Platelets: 233 10*3/uL (ref 150–400)
RBC: 3.3 MIL/uL — ABNORMAL LOW (ref 3.87–5.11)
RDW: 16.4 % — ABNORMAL HIGH (ref 11.5–15.5)
WBC: 8.4 10*3/uL (ref 4.0–10.5)

## 2015-05-26 LAB — BASIC METABOLIC PANEL
ANION GAP: 8 (ref 5–15)
BUN: 12 mg/dL (ref 6–20)
CALCIUM: 8.3 mg/dL — AB (ref 8.9–10.3)
CHLORIDE: 113 mmol/L — AB (ref 101–111)
CO2: 25 mmol/L (ref 22–32)
Creatinine, Ser: 1.03 mg/dL — ABNORMAL HIGH (ref 0.44–1.00)
GFR calc non Af Amer: 44 mL/min — ABNORMAL LOW (ref >60)
GFR, EST AFRICAN AMERICAN: 51 mL/min — AB (ref >60)
Glucose, Bld: 98 mg/dL (ref 65–99)
Potassium: 4.3 mmol/L (ref 3.5–5.1)
SODIUM: 146 mmol/L — AB (ref 135–145)

## 2015-05-26 LAB — URINE CULTURE

## 2015-05-26 MED ORDER — CEFUROXIME AXETIL 500 MG PO TABS: 500.0000 mg | ORAL_TABLET | Freq: Two times a day (BID) | ORAL | Status: AC

## 2015-05-26 MED ORDER — AMOXICILLIN-POT CLAVULANATE 500-125 MG PO TABS: 1.0000 | ORAL_TABLET | Freq: Two times a day (BID) | ORAL | Status: DC

## 2015-05-26 MED ORDER — ALPRAZOLAM 0.25 MG PO TABS: 0.2500 mg | ORAL_TABLET | Freq: Four times a day (QID) | ORAL | Status: AC | PRN

## 2015-05-26 MED ORDER — CEFUROXIME AXETIL 500 MG PO TABS: 500.0000 mg | ORAL_TABLET | Freq: Two times a day (BID) | ORAL | Status: DC

## 2015-05-26 MED ORDER — CEFUROXIME AXETIL 500 MG PO TABS
500.0000 mg | ORAL_TABLET | Freq: Two times a day (BID) | ORAL | Status: DC
  Filled 2015-05-26 (×2): qty 1

## 2015-05-26 NOTE — Care Management Important Message (Signed)
Important Message  Patient Details  Name: [name] Bressman MRN: [id number] Date of Birth: 03/26/1917   Medicare Important Message Given:  Yes    Haskell FlirtJamison, Mickie Kozikowski 05/26/2015, 11:58 AMImportant Message  Patient Details  Name: [name] Petillo MRN: [id number] Date of Birth: 09/25/1916   Medicare Important Message Given:  Yes    Haskell FlirtJamison, Tony Friscia 05/26/2015, 11:58 AM

## 2015-05-26 NOTE — Care Management Note (Signed)
Case Management Note  Patient Details  Name: Mariana ArnGeraldine Thresher MRN: [id number] Date of Birth: 01/16/1917  Subjective/Objective:                    Action/Plan:d/c snf.   Expected Discharge Date:   (unknown)               Expected Discharge Plan:  Skilled Nursing Facility  In-House Referral:  Clinical Social Work  Discharge planning Services  CM Consult  Post Acute Care Choice:    Choice offered to:     DME Arranged:    DME Agency:     HH Arranged:    HH Agency:     Status of Service:  Completed, signed off  Medicare Important Message Given:  Yes Date Medicare IM Given:    Medicare IM give by:    Date Additional Medicare IM Given:    Additional Medicare Important Message give by:     If discussed at Long Length of Stay Meetings, dates discussed:    Additional Comments:  Lanier ClamMahabir, Aven Cegielski, RN 05/26/2015, 12:15 PM

## 2015-05-26 NOTE — NC FL2 (Signed)
[place] MEDICAID FL2 LEVEL OF CARE SCREENING TOOL     IDENTIFICATION  Patient Name: [name] Birthdate: 1916-08-28 Sex: female Admission Date (Current Location): 05/23/2015  [facility] and [place] Number:  Producer, television/film/video and Address:  [facility],  [address], [place] [address]      Provider Number: [id number]  Attending Physician Name and Address:  Maryruth Bun Rama, MD  Relative Name and Phone Number:       Current Level of Care: Hospital Recommended Level of Care: Memory Care Prior Approval Number:    Date Approved/Denied:   PASRR Number:    Discharge Plan: Other (Comment) (Memory Care Unit)    Current Diagnoses: Patient Active Problem List   Diagnosis Date Noted  . E. coli UTI 05/26/2015  . UTI (urinary tract infection) 05/24/2015  . Acute encephalopathy 05/24/2015  . Acute respiratory failure with hypoxia (HCC) 05/24/2015  . HCAP (healthcare-associated pneumonia) 05/23/2015  . DNR (do not resuscitate)   . Palliative care encounter   . DVT, lower extremity, left 04/19/2015  . BRBPR (bright red blood per rectum) 12/10/2014  . Acute blood loss anemia   . GI bleed 12/06/2014  . Acute GI bleeding 12/05/2014  . Anxiety 12/05/2014  . Alzheimer's dementia 12/05/2014  . COPD (chronic obstructive pulmonary disease) (HCC) 12/05/2014  . HTN (hypertension) 12/05/2014  . Acute kidney injury (HCC) 12/05/2014    Orientation RESPIRATION BLADDER Height & Weight     (disoriented x 4)  Normal Incontinent 5\' 4"  (162.6 cm) 114 lbs.  BEHAVIORAL SYMPTOMS/MOOD NEUROLOGICAL BOWEL NUTRITION STATUS  Other (Comment) (pt can be uncooperative at times with care)   Incontinent Diet (mechanical chopped)  AMBULATORY STATUS COMMUNICATION OF NEEDS Skin    (pt uses w/c for moblization) Verbally                         Personal Care Assistance Level of Assistance  Bathing, Feeding, Dressing Bathing Assistance: Maximum assistance Feeding assistance:  Maximum assistance Dressing Assistance: Maximum assistance     Functional Limitations Info  Sight, Hearing, Speech Sight Info: Adequate Hearing Info: Adequate Speech Info: Adequate    SPECIAL CARE FACTORS FREQUENCY                       Contractures Contractures Info: Not present    Additional Factors Info  Code Status, Psychotropic (DNR)               Current Medications (05/26/2015):  This is the current hospital active medication list Current Facility-Administered Medications  Medication Dose Route Frequency Provider Last Rate Last Dose  . acetaminophen (TYLENOL) tablet 500 mg  500 mg Oral Q6H PRN Clydie Braun, MD      . ALPRAZolam Prudy Feeler) tablet 0.25 mg  0.25 mg Oral Q6H PRN Clydie Braun, MD   0.25 mg at 05/24/15 1656  . alum & mag hydroxide-simeth (MAALOX/MYLANTA) 200-200-20 MG/5ML suspension 30 mL  30 mL Oral Q6H PRN Clydie Braun, MD      . apixaban (ELIQUIS) tablet 2.5 mg  2.5 mg Oral BID Standley Brooking, MD   2.5 mg at 05/25/15 1153  . bumetanide (BUMEX) tablet 0.5 mg  0.5 mg Oral Once per day on Mon Thu Clydie Braun, MD   0.5 mg at 05/25/15 1148  . cefUROXime (CEFTIN) tablet 500 mg  500 mg Oral BID WC Maryruth Bun Rama, MD      .  citalopram (CELEXA) tablet 10 mg  10 mg Oral Daily Clydie Braun, MD   10 mg at 05/25/15 1149  . donepezil (ARICEPT) tablet 10 mg  10 mg Oral QHS Clydie Braun, MD   10 mg at 05/25/15 0046  . feeding supplement (ENSURE ENLIVE) (ENSURE ENLIVE) liquid 237 mL  237 mL Oral TID WC Clydie Braun, MD   237 mL at 05/24/15 1219  . guaiFENesin (ROBITUSSIN) 100 MG/5ML solution 200 mg  200 mg Oral Q6H PRN Clydie Braun, MD      . hydrALAZINE (APRESOLINE) tablet 10 mg  10 mg Oral BID Clydie Braun, MD   10 mg at 05/25/15 1150  . hydrocerin (EUCERIN) cream 1 application  1 application Topical BID Clydie Braun, MD   1 application at 05/25/15 2200  . ipratropium-albuterol (DUONEB) 0.5-2.5 (3) MG/3ML nebulizer solution 3 mL  3  mL Nebulization BID Standley Brooking, MD   3 mL at 05/26/15 0821  . ketotifen (ZADITOR) 0.025 % ophthalmic solution 1 drop  1 drop Both Eyes BID Clydie Braun, MD   1 drop at 05/25/15 1150  . loperamide (IMODIUM) capsule 2 mg  2 mg Oral PRN Clydie Braun, MD      . magnesium hydroxide (MILK OF MAGNESIA) suspension 30 mL  30 mL Oral Daily PRN Clydie Braun, MD      . mirtazapine (REMERON) tablet 30 mg  30 mg Oral QHS Clydie Braun, MD   30 mg at 05/25/15 0046  . pantoprazole (PROTONIX) EC tablet 40 mg  40 mg Oral Daily Clydie Braun, MD   40 mg at 05/25/15 1148  . potassium chloride 20 MEQ/15ML (10%) solution 10 mEq  10 mEq Oral Daily Clydie Braun, MD   10 mEq at 05/24/15 1016  . risperiDONE (RISPERDAL) tablet 0.25 mg  0.25 mg Oral Daily Clydie Braun, MD   0.25 mg at 05/25/15 1148  . risperiDONE (RISPERDAL) tablet 0.5 mg  0.5 mg Oral QHS Clydie Braun, MD   0.5 mg at 05/25/15 0046  . Vitamin D (Ergocalciferol) (DRISDOL) capsule 50,000 Units  50,000 Units Oral Q Thu Clydie Braun, MD   50,000 Units at 05/25/15 1148     Discharge Medications:   Medication List    TAKE these medications       acetaminophen 500 MG tablet  Commonly known as: TYLENOL  Take 500 mg by mouth every 6 (six) hours as needed for mild pain.     ALPRAZolam 0.25 MG tablet  Commonly known as: XANAX  Take 1 tablet (0.25 mg total) by mouth every 6 (six) hours as needed for anxiety (agitation). Not to exceed 6 tablets in 24 hours     alum & mag hydroxide-simeth 200-200-20 MG/5ML suspension  Commonly known as: MAALOX/MYLANTA  Take 30 mLs by mouth every 6 (six) hours as needed for indigestion or heartburn.     apixaban 2.5 MG Tabs tablet  Commonly known as: ELIQUIS  Take 1 tablet (2.5 mg total) by mouth 2 (two) times daily.     azelastine 0.05 % ophthalmic solution  Commonly known as: OPTIVAR  Place 1 drop into both eyes 2 (two) times daily.     bumetanide 1 MG  tablet  Commonly known as: BUMEX  Take 0.5 mg by mouth 2 (two) times a week. Monday and Thursday.     cefUROXime 500 MG tablet  Commonly known as: CEFTIN  Take 1 tablet (500  mg total) by mouth 2 (two) times daily with a meal.     citalopram 10 MG tablet  Commonly known as: CELEXA  Take 10 mg by mouth daily.     donepezil 10 MG tablet  Commonly known as: ARICEPT  Take 10 mg by mouth at bedtime.     ENSURE  Take 237 mLs by mouth 3 (three) times daily with meals. Mighty shakes     esomeprazole 20 MG capsule  Commonly known as: NEXIUM  Take 20 mg by mouth daily.     guaifenesin 100 MG/5ML syrup  Commonly known as: ROBITUSSIN  Take 200 mg by mouth every 6 (six) hours as needed for cough.     hydrALAZINE 10 MG tablet  Commonly known as: APRESOLINE  Take 1 tablet (10 mg total) by mouth 2 (two) times daily.     hydrocerin Crea  Apply 1 application topically 2 (two) times daily.     loperamide 2 MG capsule  Commonly known as: IMODIUM  Take 2 mg by mouth as needed for diarrhea or loose stools (not to exceed 8 doses in 24 hours).     magnesium hydroxide 400 MG/5ML suspension  Commonly known as: MILK OF MAGNESIA  Take 30 mLs by mouth daily as needed for mild constipation.     mirtazapine 30 MG tablet  Commonly known as: REMERON  Take 30 mg by mouth at bedtime.     potassium chloride 20 MEQ/15ML (10%) Soln  Take 10 mEq by mouth daily.     risperiDONE 0.25 MG tablet  Commonly known as: RISPERDAL  Take 1 tablet (0.25 mg total) by mouth daily.     risperiDONE 0.5 MG tablet  Commonly known as: RISPERDAL  Take 1 tablet (0.5 mg total) by mouth at bedtime.     TRIPLE ANTIBIOTIC EX  Apply 1 application topically. Apply after cleaning with normal saline, cover with bandaid or gauze and secure with tape. Change as needed until healed for skin tears, abrasions, or minor irritations.     Vitamin  D (Ergocalciferol) 50000 units Caps capsule  Commonly known as: DRISDOL  Take 50,000 Units by mouth every Thursday.           Please see discharge summary for a list of discharge medications.  Relevant Imaging Results:  Relevant Lab Results:   Additional Information Continue hospice services at Memory care unit  Anuoluwapo Mefferd, Dickey GaveJamie Lee, LCSW

## 2015-05-26 NOTE — Progress Notes (Signed)
[facility]. Son is aware and in agreement with plan. [facility]AR transport is required. Medical necessity form completed. D/C Summary / FL[facility] prior to d/c. Scripts included in d/c packet.  Cori RazorJamie Ayasha Ellingsen LCSW [phone]

## 2015-05-26 NOTE — Discharge Summary (Signed)
Physician Discharge Summary  [name] ZOX:[id number] DOB: June 01, 1916 DOA: 05/23/2015  PCP: Ron Parker, MD  Admit date: 05/23/2015 Discharge date: 05/26/2015   Recommendations for Outpatient Follow-Up:   1. Hospital F/U with PCP recommended in 1 week. 2. Push PO fluids. Patient is at high risk for dehydration. 3. PCP please F/U final blodd culture results.   Discharge Diagnosis:   Principal Problem:    Acute respiratory failure with hypoxia (HCC) secondary to HCAP (healthcare-associated pneumonia Active Problems:    Alzheimer's dementia    COPD (chronic obstructive pulmonary disease) (HCC)    HTN (hypertension)    BRBPR (bright red blood per rectum)    Acute encephalopathy    Acute respiratory failure with hypoxia (HCC)    E. coli UTI    Normocytic anemia    Stage III CKD    Hypernatremia   Discharge disposition:  SNF: [facility]  Discharge Condition: Improved.  Diet recommendation: Low sodium, heart healthy.    History of Present Illness:   [name] is an 82 y.o. female with PMH of COPD and dementia was admitted from her SNF on 05/23/15 with reports of confusion, low-grade temperature, and hypoxic respiratory failure. Initial evaluation suggestive of pneumonia.    Hospital Course by Problem:   Principal Problem:  Acute hypoxic respiratory failure secondary to HCAP (healthcare-associated pneumonia)/lobar pneumonia - Currently being managed with oral Augmentin, D/C on Ceftin through 05/29/14 (will cover both PNA and E Coli UTI). - Managed with supplemental oxygen which was weaned to room air 05/25/15.  Maintaining oxygen saturations at D/C.  Active Problems:  Escherichia coli UTI - Antibiotic switched to Ceftin.   Alzheimer's dementia with acute encephalopathy - Unclear baseline, but can likely return to her SNF today. - CT head negative for acute findings.   COPD (chronic obstructive pulmonary disease) (HCC) - No  wheezing. Stable.   HTN (hypertension) - Continue home medications.   Normocytic anemia with reports of BRBPR (bright red blood per rectum) - Hemoglobin stable, no evidence of GI bleeding.    Stage III CKD/hypernatremia - High risk for dehydration given dementia. - Push PO fluids.  Medical Consultants:    None.   Discharge Exam:   Filed Vitals:   05/26/15 0550 05/26/15 1000  BP: 160/56 159/62  Pulse: 79 102  Temp: 99.3 F (37.4 C) 98 F (36.7 C)  Resp: 20 18   Filed Vitals:   05/26/15 0050 05/26/15 0550 05/26/15 0821 05/26/15 1000  BP: 132/52 160/56  159/62  Pulse: 70 79  102  Temp: 99.9 F (37.7 C) 99.3 F (37.4 C)  98 F (36.7 C)  TempSrc: Axillary Axillary  Axillary  Resp: 20 20  18   Height:      Weight:      SpO2: 90% 95% 94% 96%    Gen:  NAD, pleasantly confused Cardiovascular:  RRR, No M/R/G Respiratory: Lungs CTAB Gastrointestinal: Abdomen soft, NT/ND with normal active bowel sounds. Extremities: No C/E/C   The results of significant diagnostics from this hospitalization (including imaging, microbiology, ancillary and laboratory) are listed below for reference.     Procedures and Diagnostic Studies:   Dg Chest 2 View  05/23/2015  CLINICAL DATA:  Acute onset of generalized weakness. Poorly responsive. Fever. Initial encounter. EXAM: CHEST  2 VIEW COMPARISON:  Chest radiograph performed 04/28/2015 FINDINGS: The lungs are well-aerated. Patchy right midlung and right basilar airspace opacity raises concern for pneumonia. Small bilateral pleural effusions are noted. No pneumothorax is seen. The heart  is borderline enlarged. Multiple chronic left-sided rib deformities are noted. There is mild chronic deformity involving the humeral heads bilaterally, and a significant chronic compression deformity at the lower thoracic spine. No acute osseous abnormalities are seen. IMPRESSION: Patchy right midlung and right basilar airspace opacities raise concern for  pneumonia. Small bilateral pleural effusions noted. Borderline cardiomegaly. Electronically Signed   By: Roanna RaiderJeffery  Chang M.D.   On: 05/23/2015 21:26   Ct Head Wo Contrast  05/23/2015  CLINICAL DATA:  82 year old female with altered mental status and fever. Onset today. EXAM: CT HEAD WITHOUT CONTRAST TECHNIQUE: Contiguous axial images were obtained from the base of the skull through the vertex without intravenous contrast. COMPARISON:  Head CT 04/28/2015 FINDINGS: Generalized atrophy and chronic small vessel ischemia, stable from prior exam.No intracranial hemorrhage, mass effect, or midline shift. No hydrocephalus. The basilar cisterns are patent. No evidence of territorial infarct. No intracranial fluid collection. Calvarium is intact. Included paranasal sinuses are well aerated. Unchanged opacification of right mastoid air cells. IMPRESSION: Stable atrophy and chronic small vessel ischemia. No acute intracranial abnormality. Electronically Signed   By: Rubye OaksMelanie  Ehinger M.D.   On: 05/23/2015 21:18     Labs:   Basic Metabolic Panel:  Recent Labs Lab 05/23/15 2003 05/24/15 0742 05/25/15 0537 05/26/15 0446  NA 141 143 145 146*  K 3.9 3.8 3.3* 4.3  CL 108 111 112* 113*  CO2 26 25 25 25   GLUCOSE 127* 96 102* 98  BUN 21* 20 15 12   CREATININE 1.05* 1.26* 1.07* 1.03*  CALCIUM 8.5* 8.1* 8.8* 8.3*   GFR Estimated Creatinine Clearance: 26.1 mL/min (by C-G formula based on Cr of 1.03). Liver Function Tests: No results for input(s): AST, ALT, ALKPHOS, BILITOT, PROT, ALBUMIN in the last 168 hours. No results for input(s): LIPASE, AMYLASE in the last 168 hours. No results for input(s): AMMONIA in the last 168 hours. Coagulation profile No results for input(s): INR, PROTIME in the last 168 hours.  CBC:  Recent Labs Lab 05/23/15 2003 05/24/15 0742 05/25/15 0537 05/26/15 0446  WBC 6.1 11.4* 11.8* 8.4  NEUTROABS  --  9.3*  --   --   HGB 10.8* 9.0* 10.8* 9.5*  HCT 34.0* 28.1* 33.9* 30.1*    MCV 90.2 90.1 90.6 91.2  PLT 251 214 263 233   Microbiology Recent Results (from the past 240 hour(s))  Urine C&S     Status: None (Preliminary result)   Collection Time: 05/23/15  8:05 PM  Result Value Ref Range Status   Specimen Description URINE, CATHETERIZED  Final   Special Requests NONE  Final   Culture   Final    >=100,000 COLONIES/mL ESCHERICHIA COLI [facility]    Report Status PENDING  Incomplete   Organism ID, Bacteria ESCHERICHIA COLI  Final      Susceptibility   Escherichia coli - MIC*    AMPICILLIN >=32 RESISTANT Resistant     CEFAZOLIN <=4 SENSITIVE Sensitive     CEFTRIAXONE <=1 SENSITIVE Sensitive     CIPROFLOXACIN >=4 RESISTANT Resistant     GENTAMICIN <=1 SENSITIVE Sensitive     IMIPENEM <=0.25 SENSITIVE Sensitive     NITROFURANTOIN <=16 SENSITIVE Sensitive     TRIMETH/SULFA <=20 SENSITIVE Sensitive     AMPICILLIN/SULBACTAM >=32 RESISTANT Resistant     PIP/TAZO 8 SENSITIVE Sensitive     * >=100,000 COLONIES/mL ESCHERICHIA COLI  Culture, blood (Routine X 2) w Reflex to ID Panel     Status: None (Preliminary result)  Collection Time: 05/23/15 10:50 PM  Result Value Ref Range Status   Specimen Description BLOOD RIGHT ANTECUBITAL  Final   Special Requests BOTTLES DRAWN AEROBIC AND ANAEROBIC  Final   Culture   Final    NO GROWTH 1 DAY Performed at [facility]    Report Status PENDING  Incomplete  Culture, blood (Routine X 2) w Reflex to ID Panel     Status: None (Preliminary result)   Collection Time: 05/23/15 10:53 PM  Result Value Ref Range Status   Specimen Description BLOOD BLOOD RIGHT FOREARM  Final   Special Requests IN PEDIATRIC BOTTLE  Final   Culture   Final    NO GROWTH 1 DAY Performed at [facility]    Report Status PENDING  Incomplete     Discharge Instructions:       Discharge Instructions    Call MD for:  extreme fatigue    Complete by:  As directed      Call MD for:  persistant  nausea and vomiting    Complete by:  As directed      Call MD for:  temperature >100.4    Complete by:  As directed      Diet - low sodium heart healthy    Complete by:  As directed      Increase activity slowly    Complete by:  As directed             Medication List    TAKE these medications        acetaminophen 500 MG tablet  Commonly known as:  TYLENOL  Take 500 mg by mouth every 6 (six) hours as needed for mild pain.     ALPRAZolam 0.25 MG tablet  Commonly known as:  XANAX  Take 1 tablet (0.25 mg total) by mouth every 6 (six) hours as needed for anxiety (agitation). Not to exceed 6 tablets in 24 hours     alum & mag hydroxide-simeth 200-200-20 MG/5ML suspension  Commonly known as:  MAALOX/MYLANTA  Take 30 mLs by mouth every 6 (six) hours as needed for indigestion or heartburn.     apixaban 2.5 MG Tabs tablet  Commonly known as:  ELIQUIS  Take 1 tablet (2.5 mg total) by mouth 2 (two) times daily.     azelastine 0.05 % ophthalmic solution  Commonly known as:  OPTIVAR  Place 1 drop into both eyes 2 (two) times daily.     bumetanide 1 MG tablet  Commonly known as:  BUMEX  Take 0.5 mg by mouth 2 (two) times a week. Monday and Thursday.     cefUROXime 500 MG tablet  Commonly known as:  CEFTIN  Take 1 tablet (500 mg total) by mouth 2 (two) times daily with a meal.     citalopram 10 MG tablet  Commonly known as:  CELEXA  Take 10 mg by mouth daily.     donepezil 10 MG tablet  Commonly known as:  ARICEPT  Take 10 mg by mouth at bedtime.     ENSURE  Take 237 mLs by mouth 3 (three) times daily with meals. Mighty shakes     esomeprazole 20 MG capsule  Commonly known as:  NEXIUM  Take 20 mg by mouth daily.     guaifenesin 100 MG/5ML syrup  Commonly known as:  ROBITUSSIN  Take 200 mg by mouth every 6 (six) hours as needed for cough.     hydrALAZINE 10 MG tablet  Commonly known as:  APRESOLINE  Take 1 tablet (10 mg total) by mouth 2 (two) times daily.      hydrocerin Crea  Apply 1 application topically 2 (two) times daily.     loperamide 2 MG capsule  Commonly known as:  IMODIUM  Take 2 mg by mouth as needed for diarrhea or loose stools (not to exceed 8 doses in 24 hours).     magnesium hydroxide 400 MG/5ML suspension  Commonly known as:  MILK OF MAGNESIA  Take 30 mLs by mouth daily as needed for mild constipation.     mirtazapine 30 MG tablet  Commonly known as:  REMERON  Take 30 mg by mouth at bedtime.     potassium chloride 20 MEQ/15ML (10%) Soln  Take 10 mEq by mouth daily.     risperiDONE 0.25 MG tablet  Commonly known as:  RISPERDAL  Take 1 tablet (0.25 mg total) by mouth daily.     risperiDONE 0.5 MG tablet  Commonly known as:  RISPERDAL  Take 1 tablet (0.5 mg total) by mouth at bedtime.     TRIPLE ANTIBIOTIC EX  Apply 1 application topically. Apply after cleaning with normal saline, cover with bandaid or gauze and secure with tape. Change as needed until healed for skin tears, abrasions, or minor irritations.     Vitamin D (Ergocalciferol) 50000 units Caps capsule  Commonly known as:  DRISDOL  Take 50,000 Units by mouth every Thursday.       Follow-up Information    Follow up with [facility], MD. Schedule an appointment as soon as possible for a visit in 1 week.   Specialty:  Internal Medicine   Why:  Hospital follow up of pneumonia.       Time coordinating discharge: 35 minutes.  Signed:  Tiki Tucciarone  Pager [phone] [facility] 05/26/2015, 11:05 AM

## 2015-05-29 LAB — CULTURE, BLOOD (ROUTINE X 2)
Culture: NO GROWTH
Culture: NO GROWTH

## 2015-07-19 DEATH — deceased

## 2016-06-01 IMAGING — CT CT HEAD W/O CM
2 series · 17 of 30 positions shown, 20 images · non-contrast
Comparison: Head CT 04/28/2015

CLINICAL DATA: [AGE] female with altered mental status and
fever. Onset today.

EXAM:
CT HEAD WITHOUT CONTRAST
TECHNIQUE: Contiguous axial images were obtained from the base of the skull
through the vertex without intravenous contrast.

[Series 2: head w/o · axial · non-contrast · 0.40mm/px · z∈[-128,-13]mm · 9 of 29 slices shown, 12 images]
[im 3/29  brain]
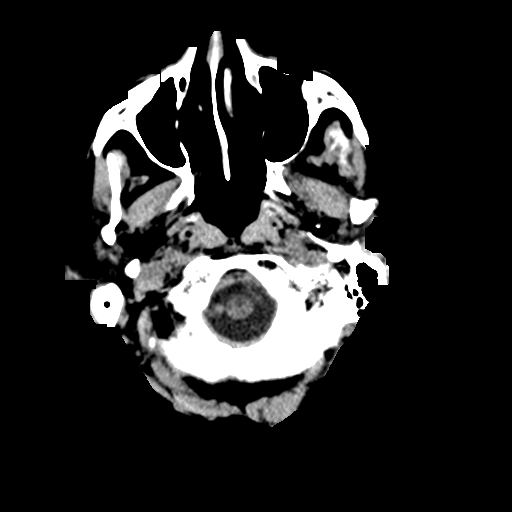
[im 3/29  bone]
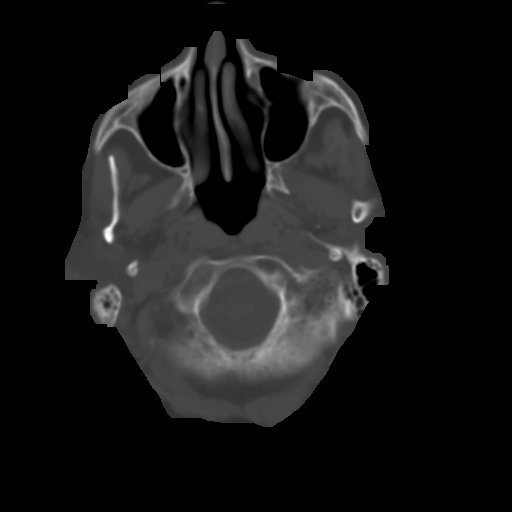
[im 6/29  brain]
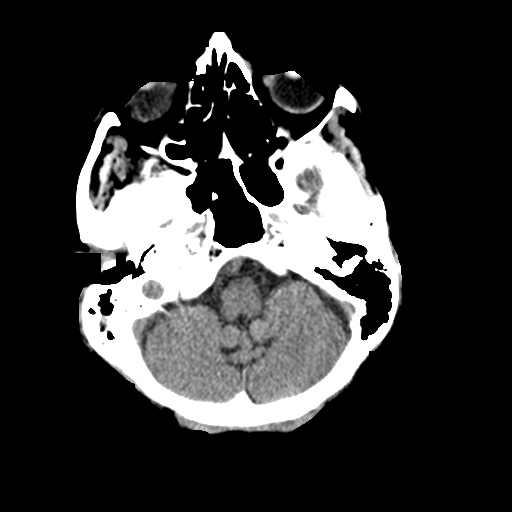
[im 9/29  brain]
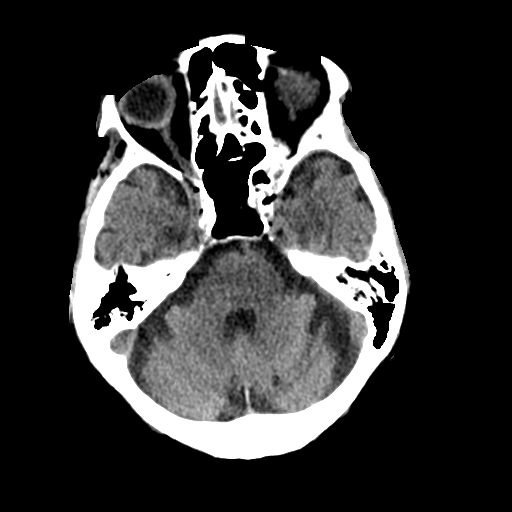
[im 12/29  brain]
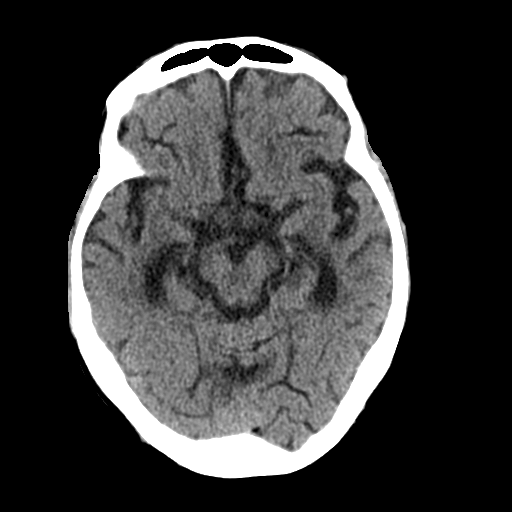
[im 15/29  brain]
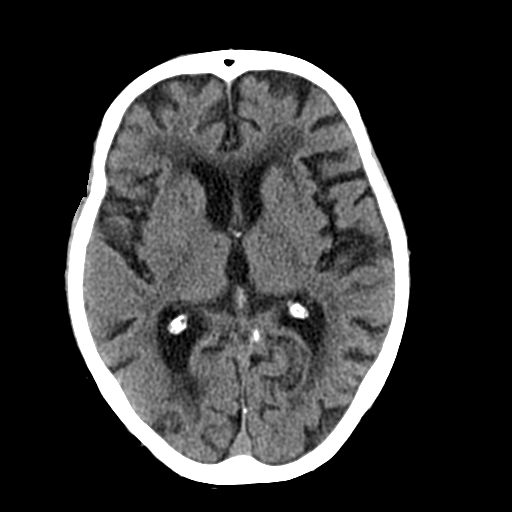
[im 15/29  bone]
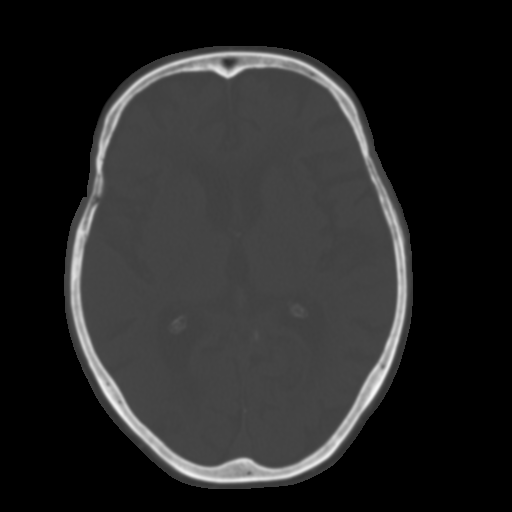
[im 17/29  brain]
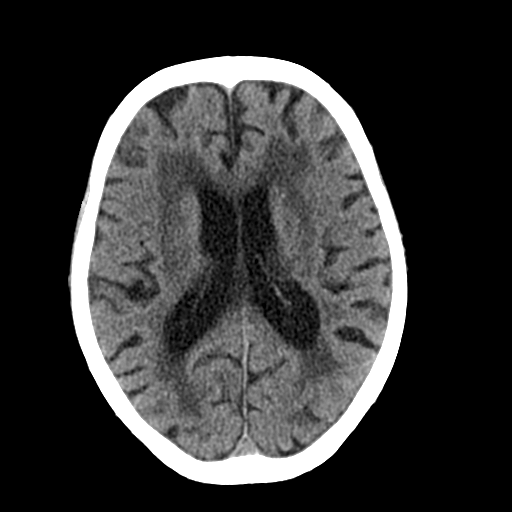
[im 20/29  brain]
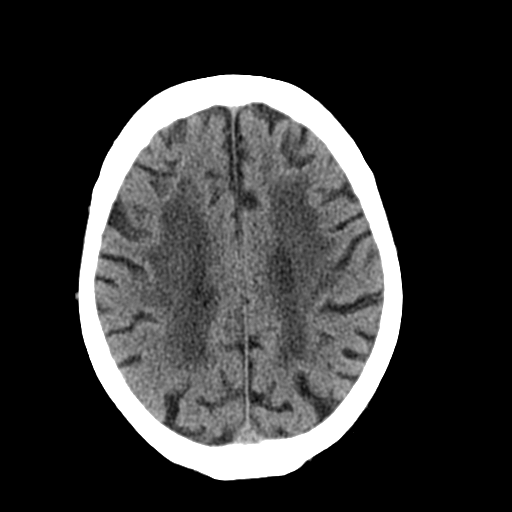
[im 23/29  brain]
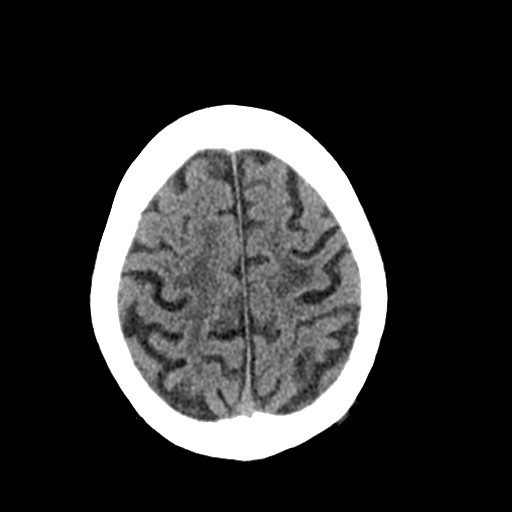
[im 26/29  brain]
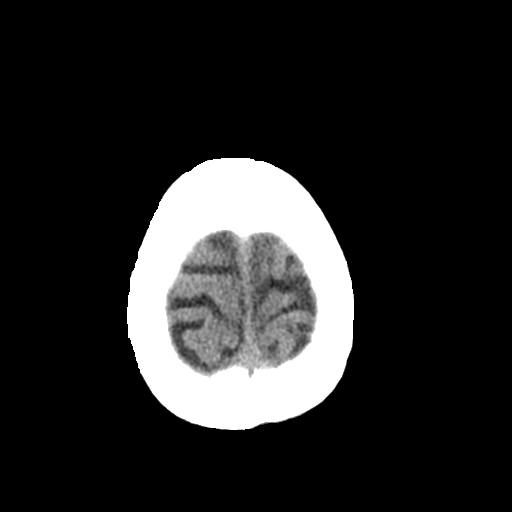
[im 26/29  bone]
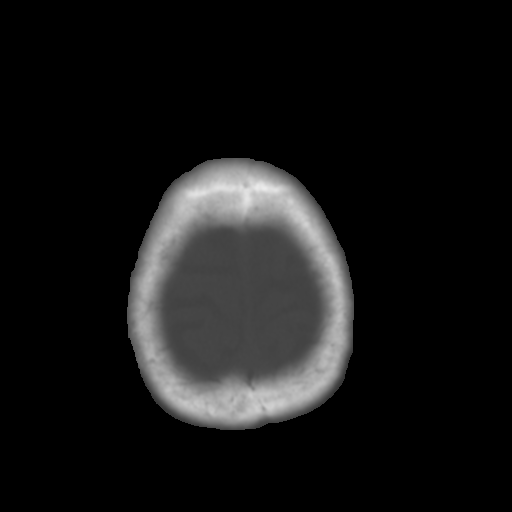

[Series 3: bone windows · axial · 0.40mm/px · z∈[-123,-15]mm · 8 of 48 slices shown]
[im 6/48  bone]
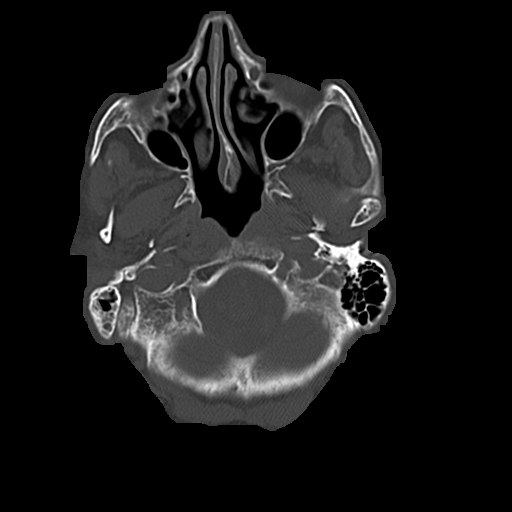
[im 11/48  bone]
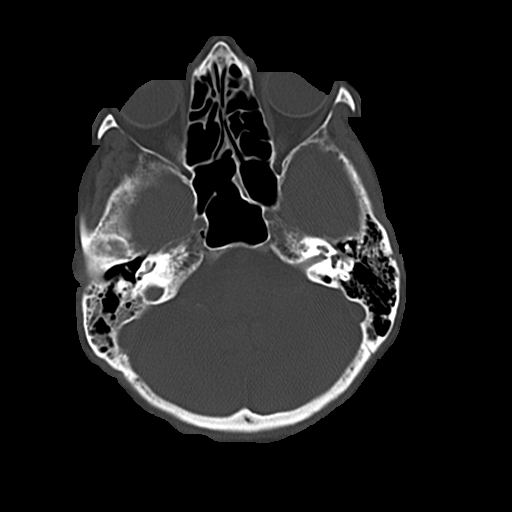
[im 16/48  bone]
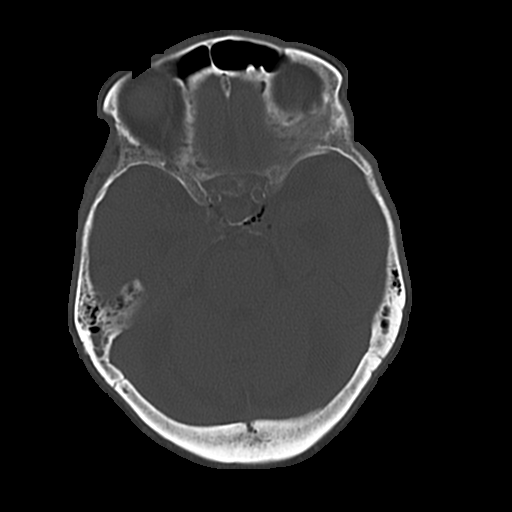
[im 21/48  bone]
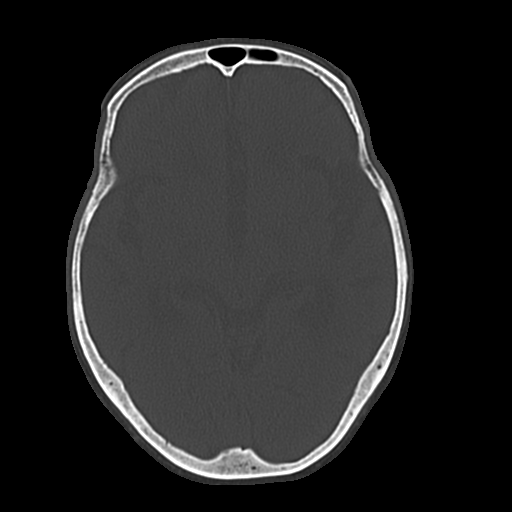
[im 27/48  bone]
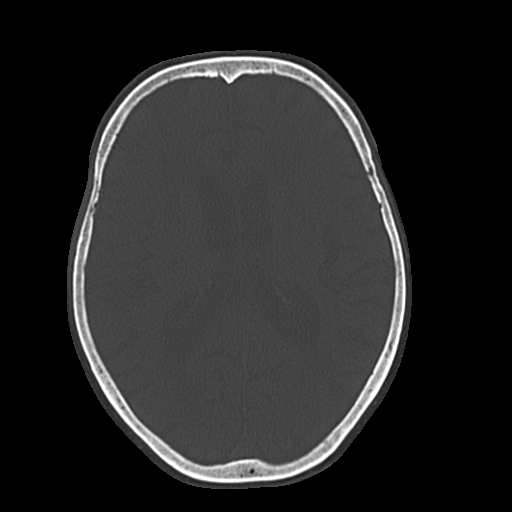
[im 32/48  bone]
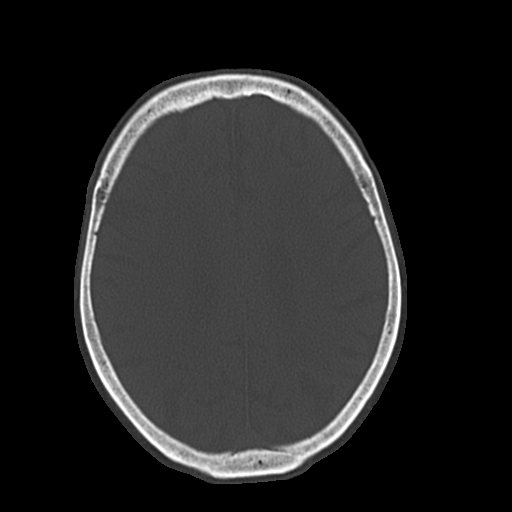
[im 37/48  bone]
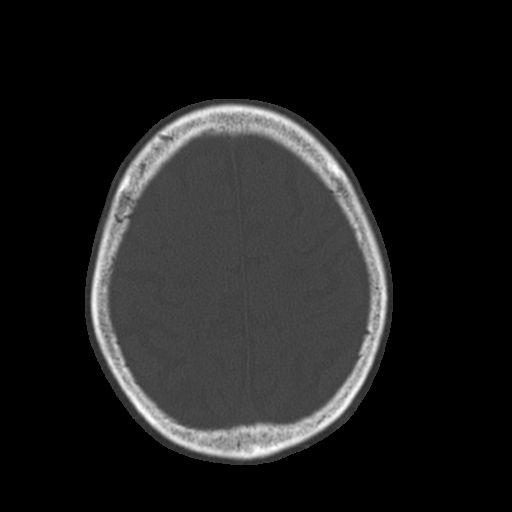
[im 42/48  bone]
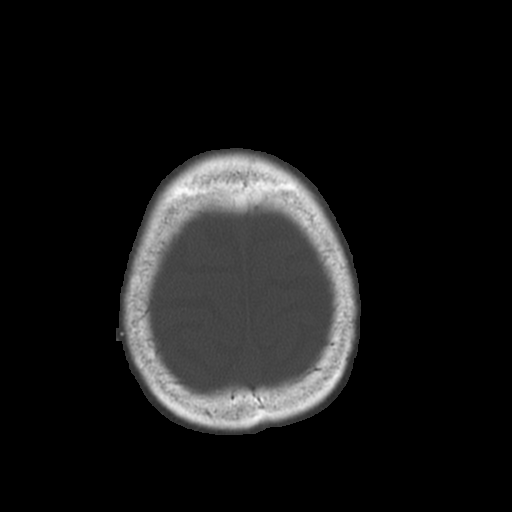

[17 of 30 positions shown; findings below may reference images not displayed]

FINDINGS: Generalized atrophy and chronic small vessel ischemia, stable from
prior exam.No intracranial hemorrhage, mass effect, or midline
shift. No hydrocephalus. The basilar cisterns are patent. No
evidence of territorial infarct. No intracranial fluid collection.
Calvarium is intact. Included paranasal sinuses are well aerated.
Unchanged opacification of right mastoid air cells.
IMPRESSION: Stable atrophy and chronic small vessel ischemia. No acute
intracranial abnormality.
